# Patient Record
Sex: Female | Born: 1935
Health system: Southern US, Community
[De-identification: ages and names within clinical notes are randomized; demographics above are authoritative.]

## PROBLEM LIST (undated history)

## (undated) DIAGNOSIS — R002 Palpitations: Secondary | ICD-10-CM

## (undated) DIAGNOSIS — M810 Age-related osteoporosis without current pathological fracture: Secondary | ICD-10-CM

## (undated) DIAGNOSIS — M1990 Unspecified osteoarthritis, unspecified site: Secondary | ICD-10-CM

## (undated) DIAGNOSIS — I493 Ventricular premature depolarization: Secondary | ICD-10-CM

## (undated) HISTORY — DX: Age-related osteoporosis without current pathological fracture: M81.0

## (undated) HISTORY — PX: FINGER GANGLION CYST EXCISION: SHX1636

## (undated) HISTORY — DX: Unspecified osteoarthritis, unspecified site: M19.90

## (undated) HISTORY — PX: BREAST CYST ASPIRATION: SHX578

## (undated) HISTORY — PX: CHOLECYSTECTOMY: SHX55

## (undated) HISTORY — DX: Ventricular premature depolarization: I49.3

## (undated) HISTORY — PX: EYE SURGERY: SHX253

## (undated) HISTORY — DX: Palpitations: R00.2

---

## 1999-01-30 ENCOUNTER — Other Ambulatory Visit: Admission: RE | Admit: 1999-01-30 | Discharge: 1999-01-30 | Payer: Self-pay | Admitting: Family Medicine

## 1999-12-15 ENCOUNTER — Encounter: Admission: RE | Admit: 1999-12-15 | Discharge: 1999-12-15 | Payer: Self-pay | Admitting: Family Medicine

## 1999-12-15 ENCOUNTER — Encounter: Payer: Self-pay | Admitting: Family Medicine

## 2000-02-01 ENCOUNTER — Other Ambulatory Visit: Admission: RE | Admit: 2000-02-01 | Discharge: 2000-02-01 | Payer: Self-pay | Admitting: Family Medicine

## 2000-02-19 ENCOUNTER — Encounter: Payer: Self-pay | Admitting: Family Medicine

## 2000-02-19 ENCOUNTER — Encounter: Admission: RE | Admit: 2000-02-19 | Discharge: 2000-02-19 | Payer: Self-pay | Admitting: Family Medicine

## 2001-01-03 ENCOUNTER — Encounter: Admission: RE | Admit: 2001-01-03 | Discharge: 2001-01-03 | Payer: Self-pay | Admitting: Family Medicine

## 2001-01-03 ENCOUNTER — Encounter: Payer: Self-pay | Admitting: Family Medicine

## 2001-02-27 ENCOUNTER — Encounter: Admission: RE | Admit: 2001-02-27 | Discharge: 2001-02-27 | Payer: Self-pay | Admitting: Family Medicine

## 2001-02-27 ENCOUNTER — Encounter: Payer: Self-pay | Admitting: Family Medicine

## 2001-03-03 ENCOUNTER — Other Ambulatory Visit: Admission: RE | Admit: 2001-03-03 | Discharge: 2001-03-03 | Payer: Self-pay | Admitting: Family Medicine

## 2001-07-04 ENCOUNTER — Ambulatory Visit (HOSPITAL_COMMUNITY): Admission: RE | Admit: 2001-07-04 | Discharge: 2001-07-04 | Payer: Self-pay | Admitting: Gastroenterology

## 2001-07-04 ENCOUNTER — Encounter (INDEPENDENT_AMBULATORY_CARE_PROVIDER_SITE_OTHER): Payer: Self-pay | Admitting: *Deleted

## 2002-02-01 ENCOUNTER — Encounter: Payer: Self-pay | Admitting: Family Medicine

## 2002-02-01 ENCOUNTER — Encounter: Admission: RE | Admit: 2002-02-01 | Discharge: 2002-02-01 | Payer: Self-pay | Admitting: Family Medicine

## 2003-01-22 ENCOUNTER — Emergency Department (HOSPITAL_COMMUNITY): Admission: EM | Admit: 2003-01-22 | Discharge: 2003-01-22 | Payer: Self-pay | Admitting: Emergency Medicine

## 2003-03-20 ENCOUNTER — Encounter: Admission: RE | Admit: 2003-03-20 | Discharge: 2003-03-20 | Payer: Self-pay | Admitting: Family Medicine

## 2003-03-20 ENCOUNTER — Encounter: Payer: Self-pay | Admitting: Family Medicine

## 2004-03-27 ENCOUNTER — Encounter: Admission: RE | Admit: 2004-03-27 | Discharge: 2004-03-27 | Payer: Self-pay | Admitting: Family Medicine

## 2005-05-11 ENCOUNTER — Encounter: Admission: RE | Admit: 2005-05-11 | Discharge: 2005-05-11 | Payer: Self-pay | Admitting: Family Medicine

## 2005-06-02 ENCOUNTER — Encounter: Admission: RE | Admit: 2005-06-02 | Discharge: 2005-06-02 | Payer: Self-pay | Admitting: Family Medicine

## 2006-06-16 ENCOUNTER — Encounter: Admission: RE | Admit: 2006-06-16 | Discharge: 2006-06-16 | Payer: Self-pay | Admitting: Family Medicine

## 2006-06-16 IMAGING — MG MM SCREEN MAMMOGRAM BILATERAL
4 series · 4 of 4 positions shown · non-contrast
Comparison: none

DG SCREEN MAMMOGRAM BILATERAL
Bilateral CC and MLO view(s) were taken.
Prior study comparison: [DATE], bilateral screening mammogram.

DIGITAL SCREENING MAMMOGRAM WITH CAD:
There are scattered fibroglandular densities.  There is no dominant mass, architectural distortion 
or calcification to suggest malignancy.

[R CC]
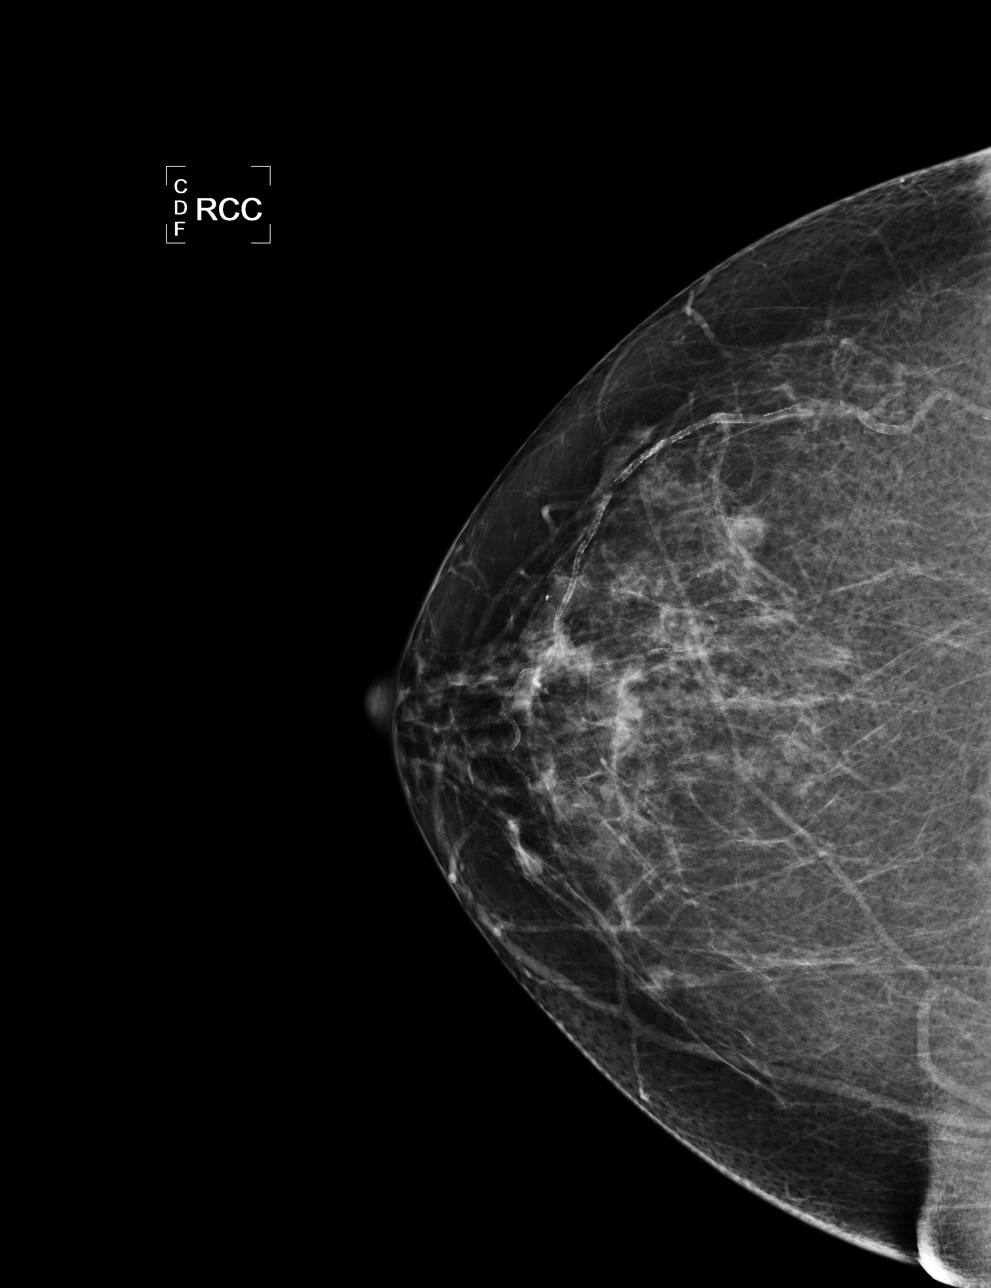

[L CC]
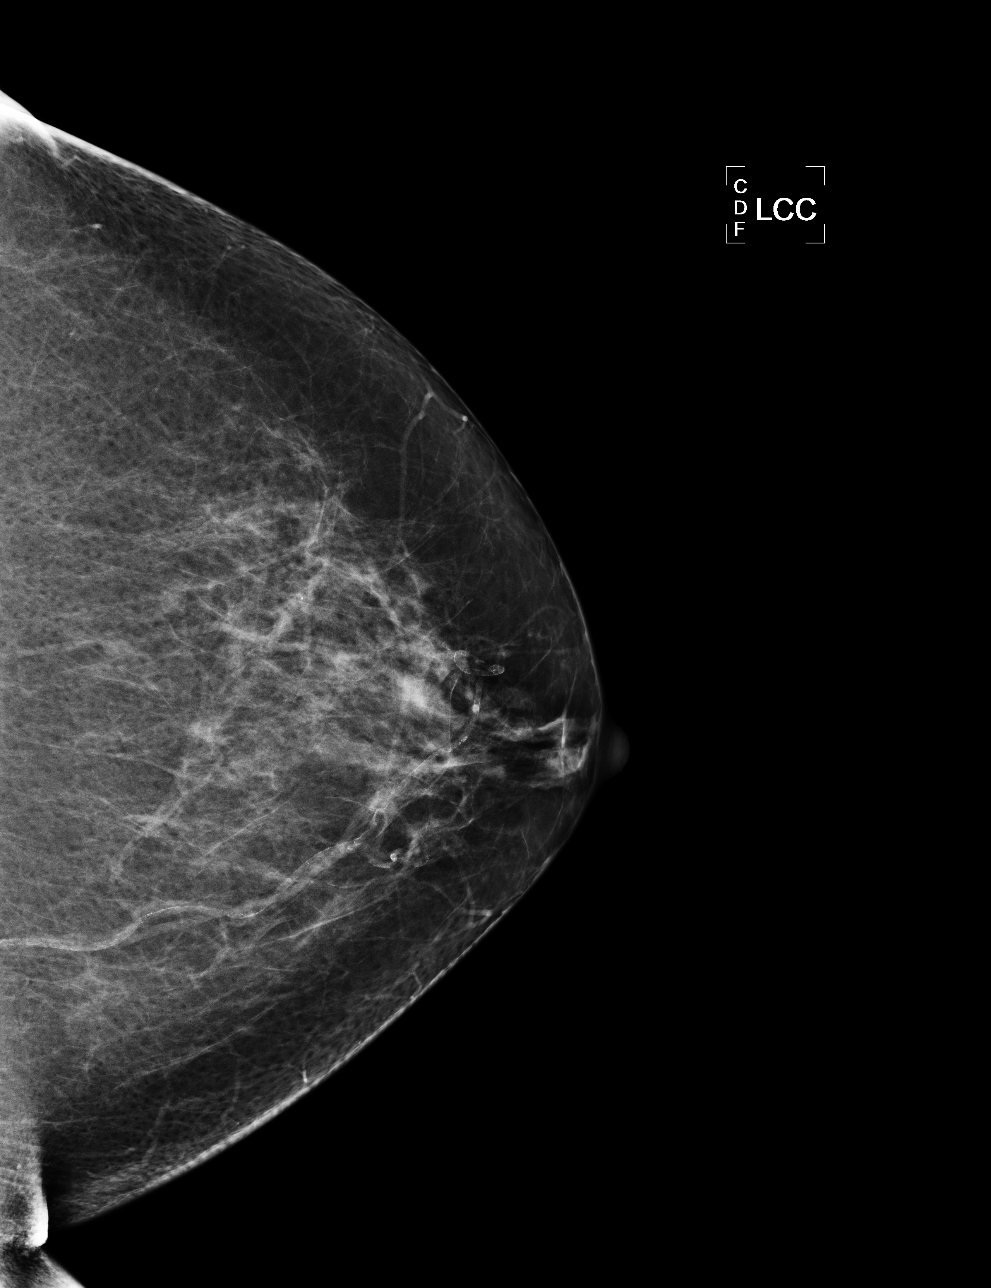

[L MLO]
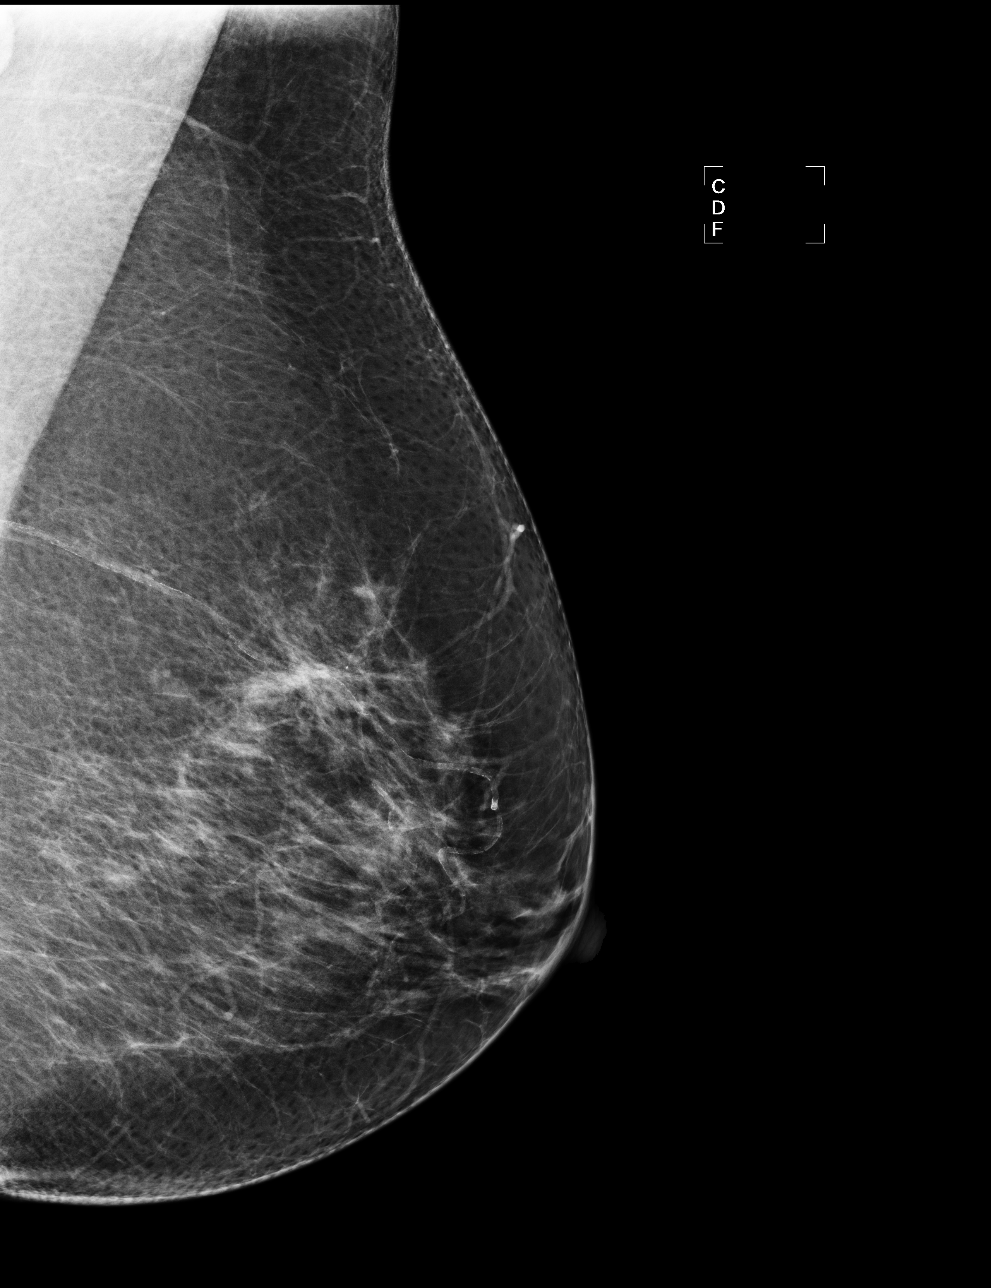

[R MLO]
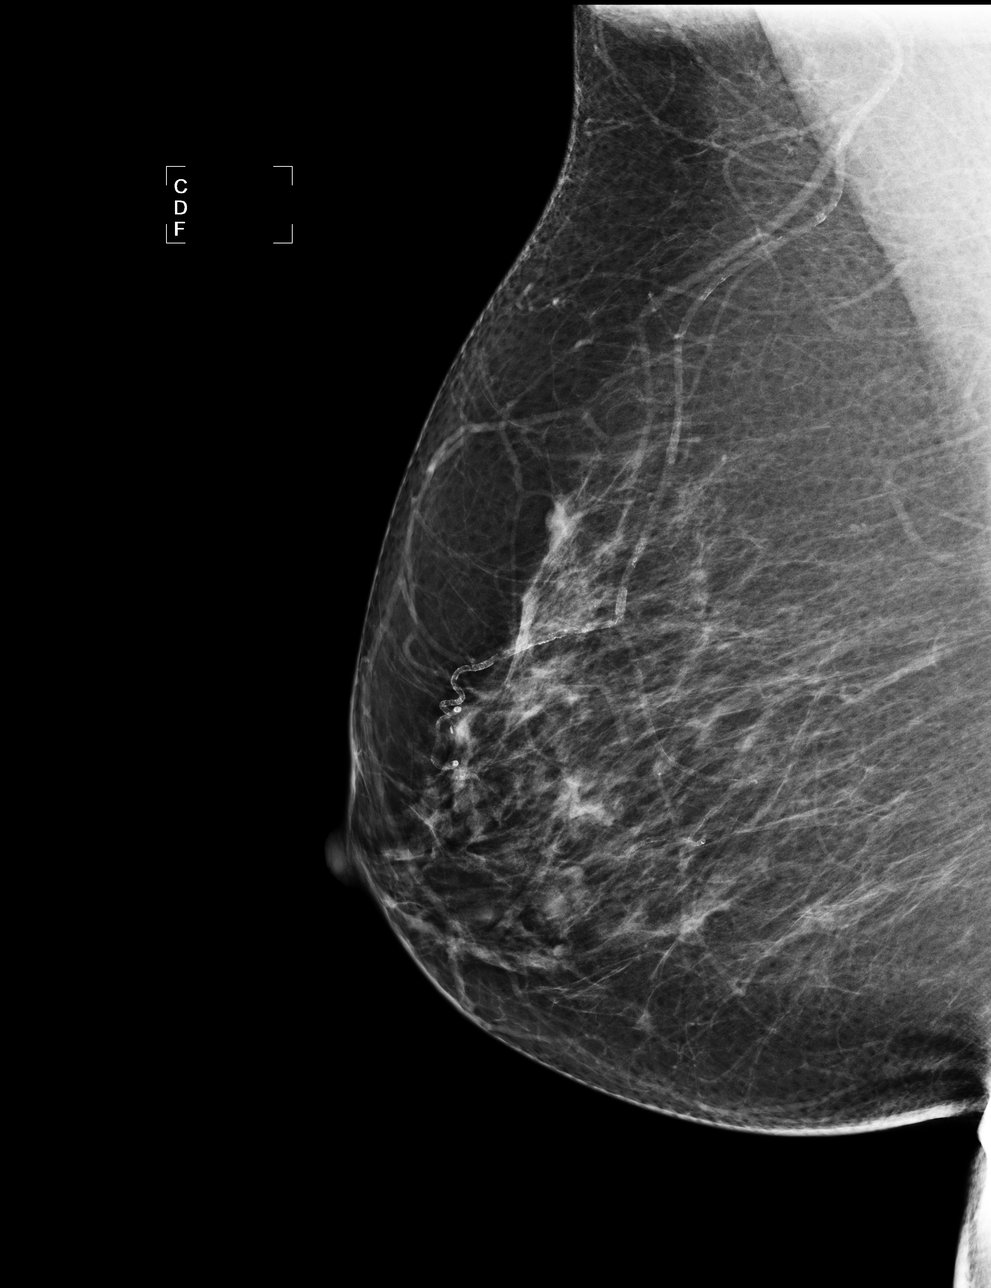

[4 of 4 positions shown; findings below may reference images not displayed]

IMPRESSION: No mammographic evidence of malignancy.  Suggest yearly screening mammography.

ASSESSMENT: Negative - BI-RADS 1

Screening mammogram in 1 year.
ANALYZED BY COMPUTER AIDED DETECTION. , THIS PROCEDURE WAS A DIGITAL MAMMOGRAM.

## 2006-07-07 ENCOUNTER — Inpatient Hospital Stay (HOSPITAL_COMMUNITY): Admission: EM | Admit: 2006-07-07 | Discharge: 2006-07-08 | Payer: Self-pay | Admitting: Emergency Medicine

## 2006-07-07 ENCOUNTER — Ambulatory Visit: Payer: Self-pay | Admitting: Cardiology

## 2006-07-07 IMAGING — CR DG CHEST 1V PORT
1 series · 1 of 1 positions shown · non-contrast
Comparison: None.

CLINICAL DATA: Chest pain.
 PORTABLE CHEST ? 1 VIEW:

[view not recorded]
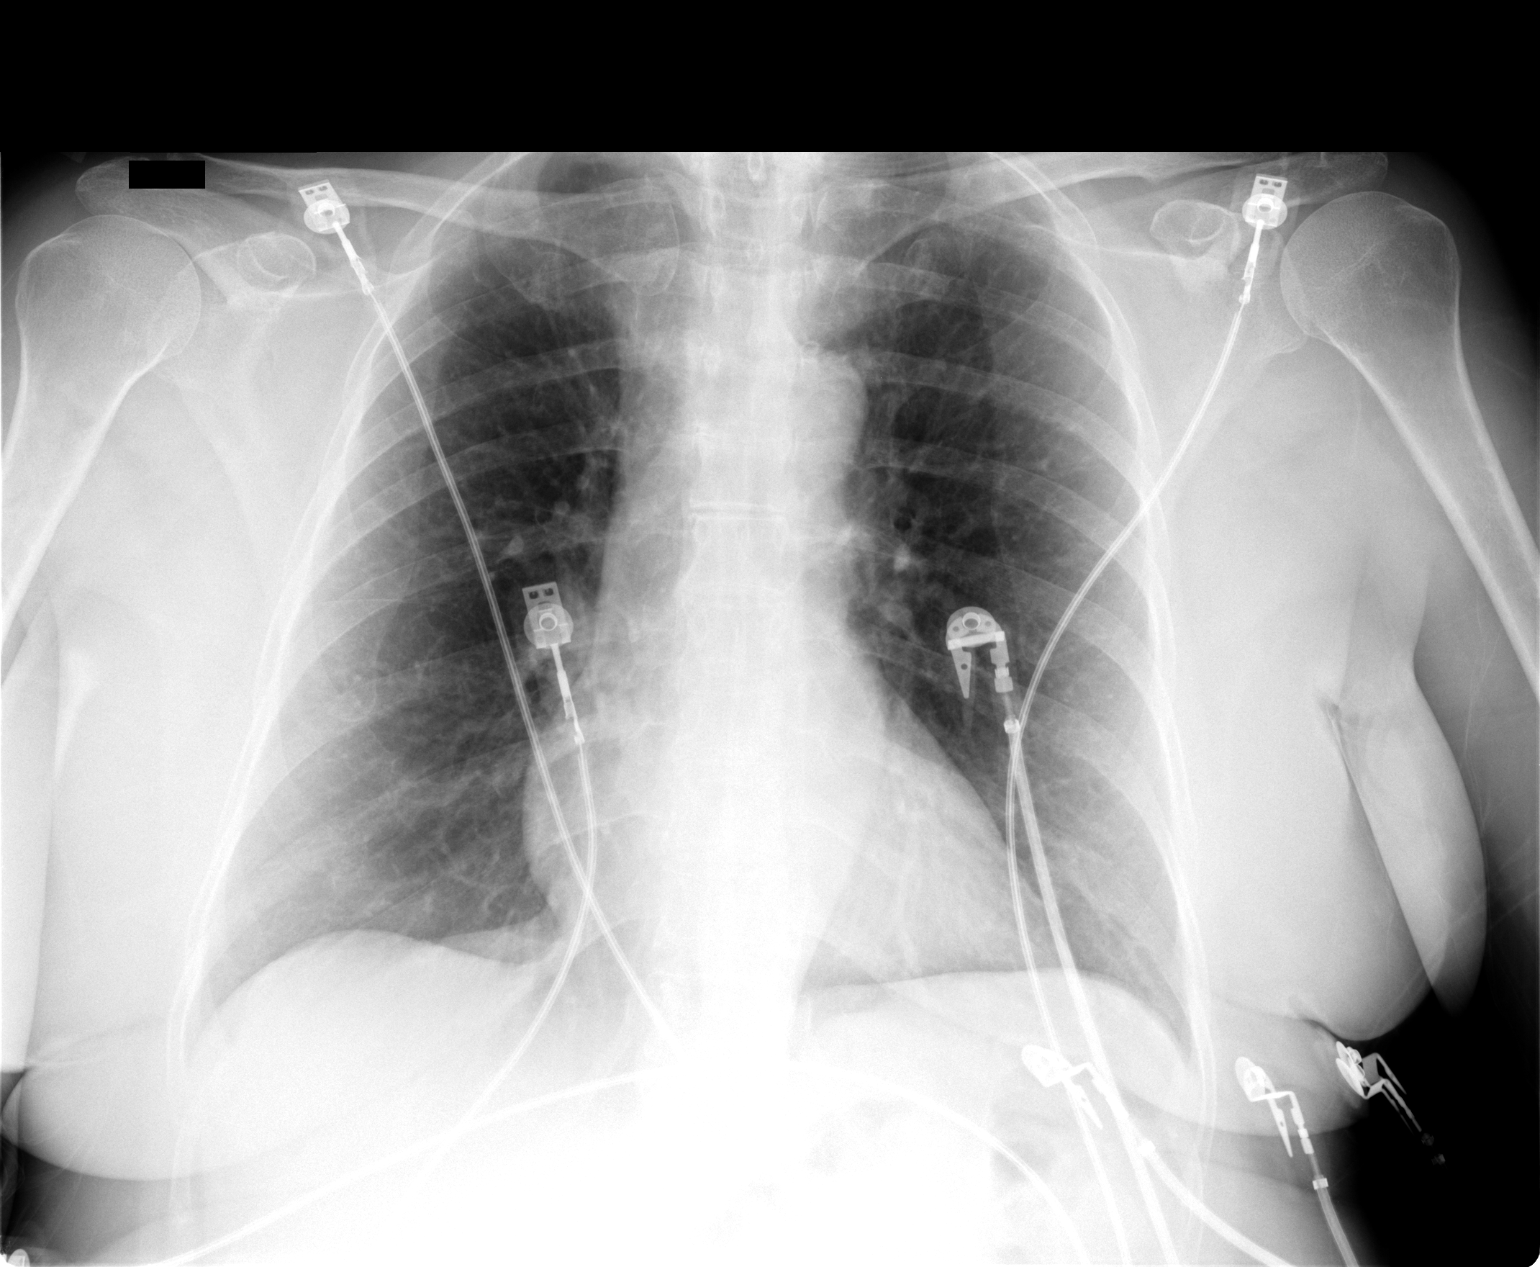

[1 of 1 positions shown; findings below may reference images not displayed]

FINDINGS: Midline trachea.   Heart size is normal.  Minimally tortuous descending thoracic aorta.  Mediastinal contours are otherwise within normal limits.  Costophrenic angles are sharp.  No pneumothorax or congestive failure.  Mild biapical pleural thickening.  COPD.  Lungs are clear.
IMPRESSION: COPD with no acute cardiopulmonary disease.

## 2006-07-08 ENCOUNTER — Encounter: Payer: Self-pay | Admitting: Cardiology

## 2006-07-18 ENCOUNTER — Ambulatory Visit: Payer: Self-pay

## 2007-02-10 DIAGNOSIS — K219 Gastro-esophageal reflux disease without esophagitis: Secondary | ICD-10-CM | POA: Insufficient documentation

## 2007-02-10 DIAGNOSIS — M1990 Unspecified osteoarthritis, unspecified site: Secondary | ICD-10-CM | POA: Insufficient documentation

## 2007-02-10 DIAGNOSIS — M810 Age-related osteoporosis without current pathological fracture: Secondary | ICD-10-CM | POA: Insufficient documentation

## 2007-06-19 ENCOUNTER — Encounter: Admission: RE | Admit: 2007-06-19 | Discharge: 2007-06-19 | Payer: Self-pay | Admitting: Family Medicine

## 2007-06-19 IMAGING — MG MM SCREEN MAMMOGRAM BILATERAL
6 series · 6 of 6 positions shown · non-contrast
Comparison: none

DG SCREEN MAMMOGRAM BILATERAL
Bilateral CC and MLO view(s) were taken.

DIGITAL SCREENING MAMMOGRAM WITH CAD:
There are scattered fibroglandular densities.  No masses or malignant type calcifications are 
identified.  Compared with prior studies.

[R CC]
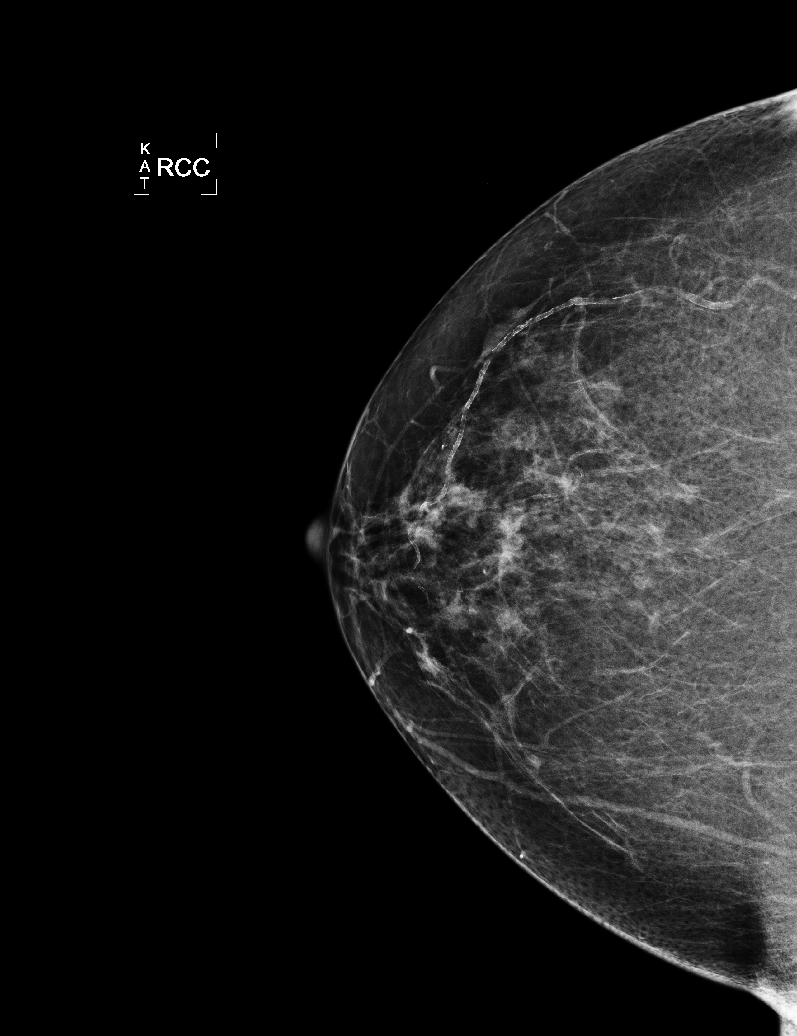

[L CC]
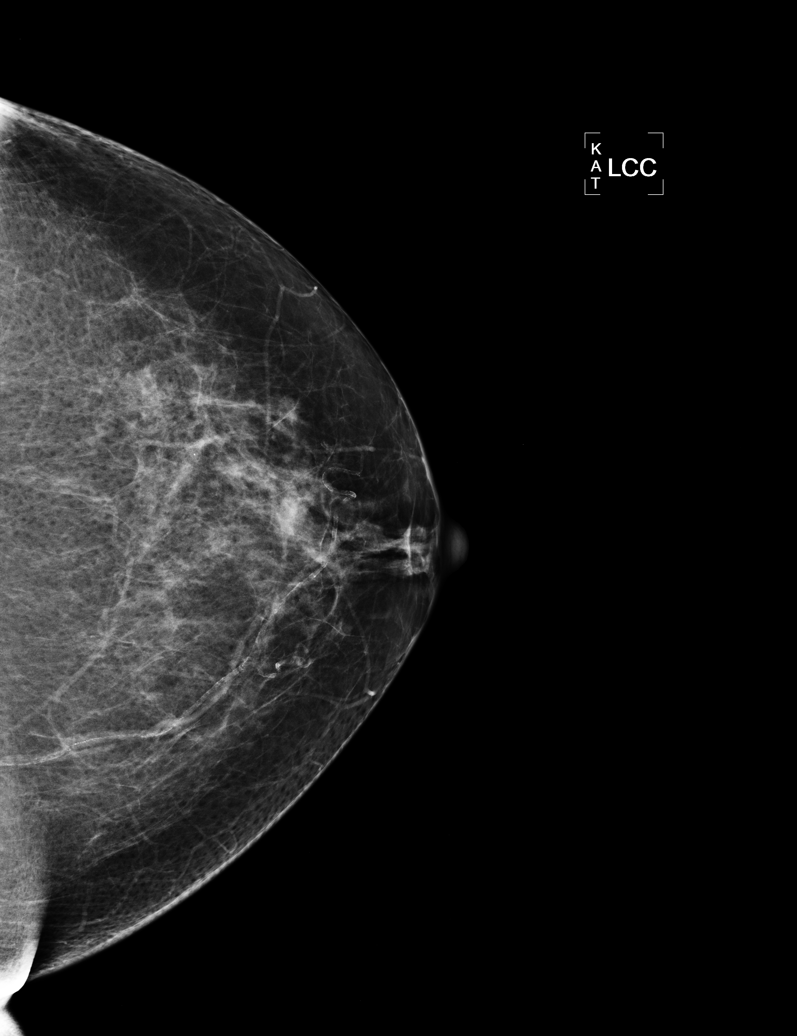

[L MLO (1 of 2)]
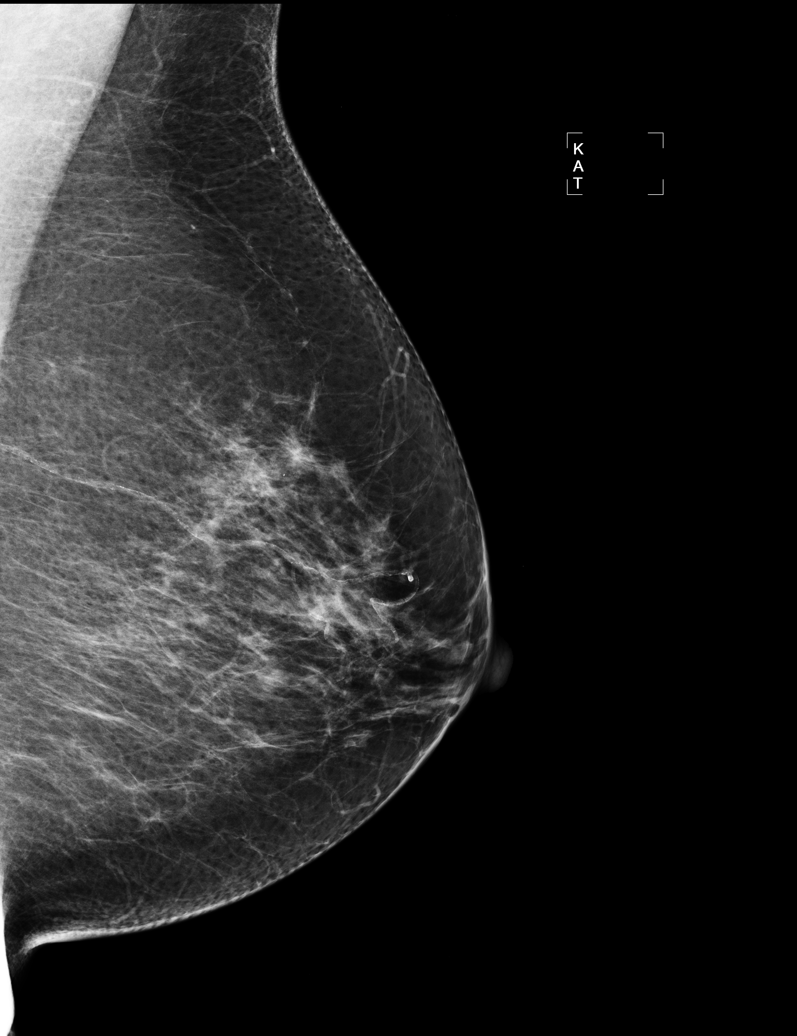

[R MLO (1 of 2)]
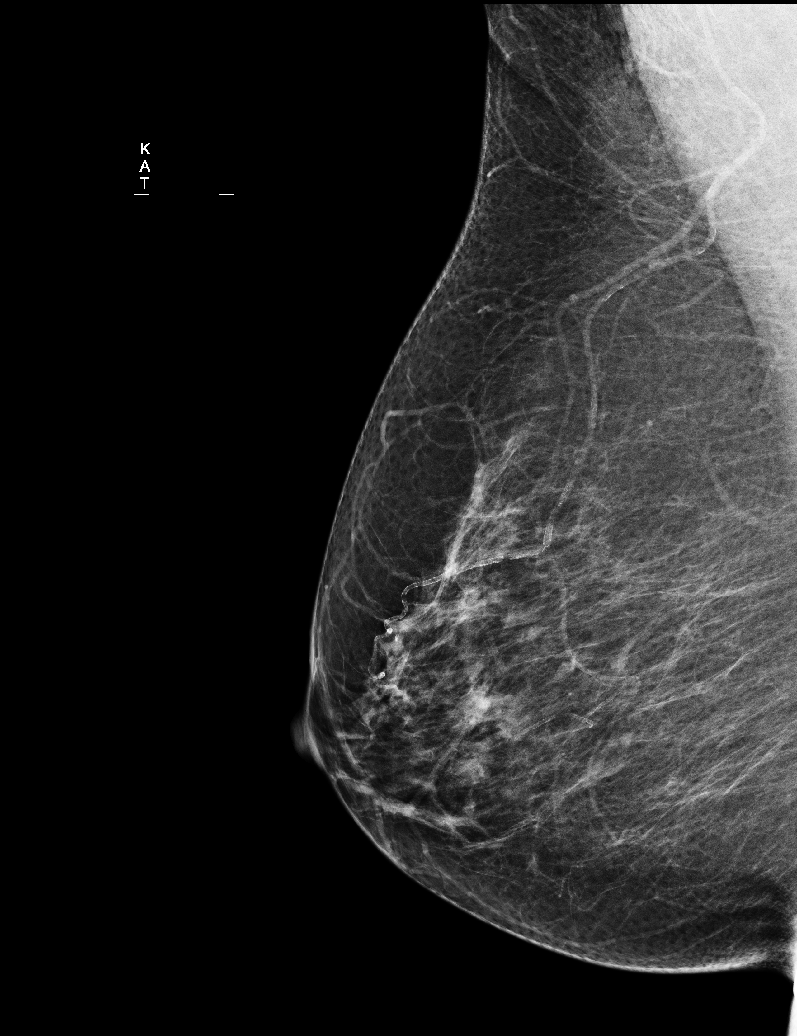

[R MLO (2 of 2)]
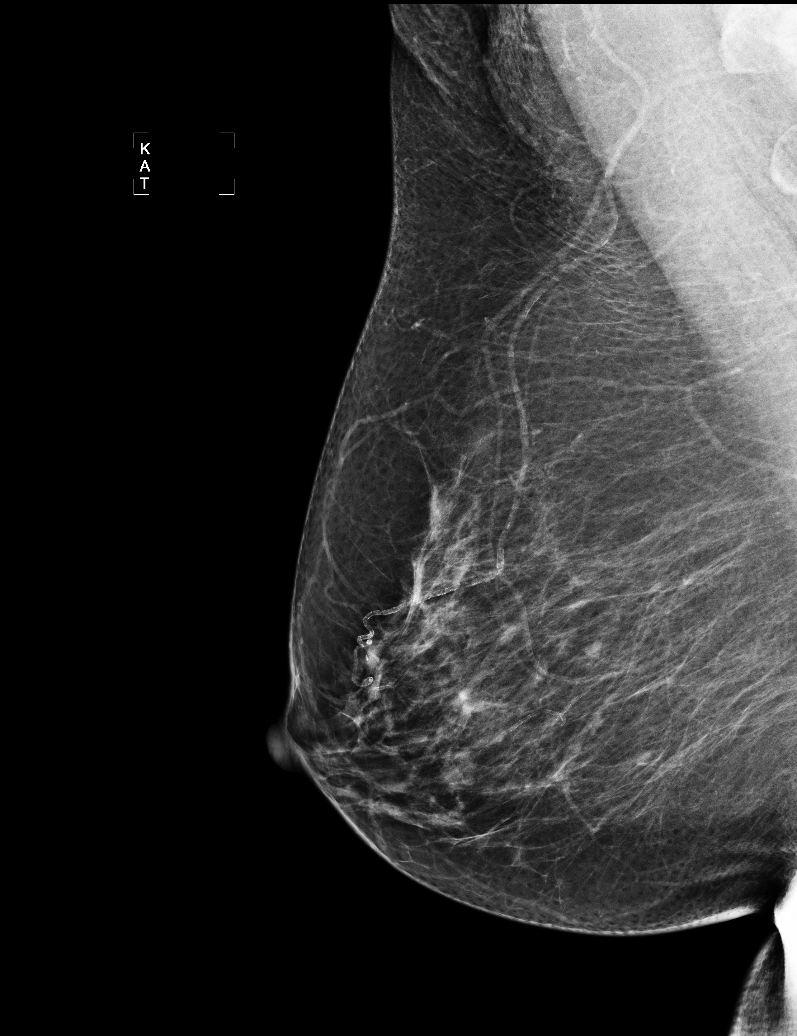

[L MLO (2 of 2)]
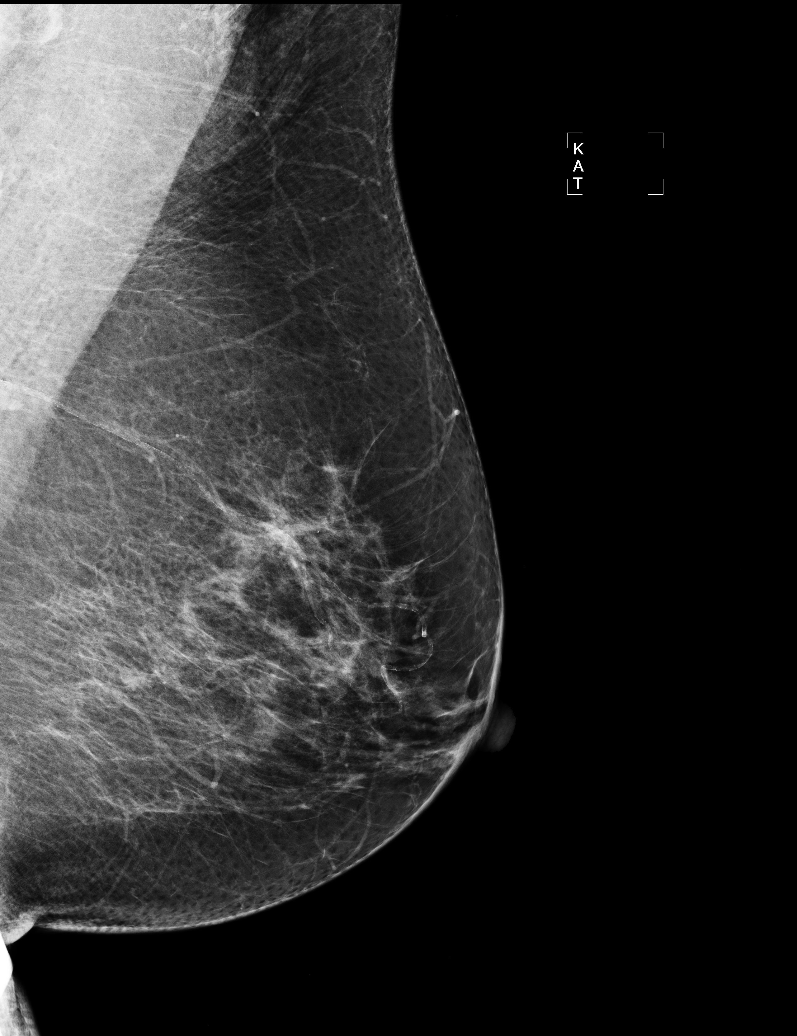

[6 of 6 positions shown; findings below may reference images not displayed]

IMPRESSION: No specific mammographic evidence of malignancy.  Next screening mammogram is recommended in one 
year.

ASSESSMENT: Negative - BI-RADS 1

Screening mammogram in 1 year.
ANALYZED BY COMPUTER AIDED DETECTION. , THIS PROCEDURE WAS A DIGITAL MAMMOGRAM.

## 2007-07-31 ENCOUNTER — Encounter: Admission: RE | Admit: 2007-07-31 | Discharge: 2007-07-31 | Payer: Self-pay | Admitting: Family Medicine

## 2007-08-17 ENCOUNTER — Encounter (INDEPENDENT_AMBULATORY_CARE_PROVIDER_SITE_OTHER): Payer: Self-pay | Admitting: Orthopedic Surgery

## 2007-08-17 ENCOUNTER — Ambulatory Visit (HOSPITAL_BASED_OUTPATIENT_CLINIC_OR_DEPARTMENT_OTHER): Admission: RE | Admit: 2007-08-17 | Discharge: 2007-08-17 | Payer: Self-pay | Admitting: Orthopedic Surgery

## 2008-07-19 ENCOUNTER — Encounter: Admission: RE | Admit: 2008-07-19 | Discharge: 2008-07-19 | Payer: Self-pay | Admitting: Family Medicine

## 2008-07-19 IMAGING — MG MM SCREEN MAMMOGRAM BILATERAL
4 series · 4 of 4 positions shown · non-contrast
Comparison: none

DG SCREEN MAMMOGRAM BILATERAL
Bilateral CC and MLO view(s) were taken.

DIGITAL SCREENING MAMMOGRAM WITH CAD:
There are scattered fibroglandular densities.  No masses or malignant type calcifications are 
identified.  Compared with prior studies.

[R CC]
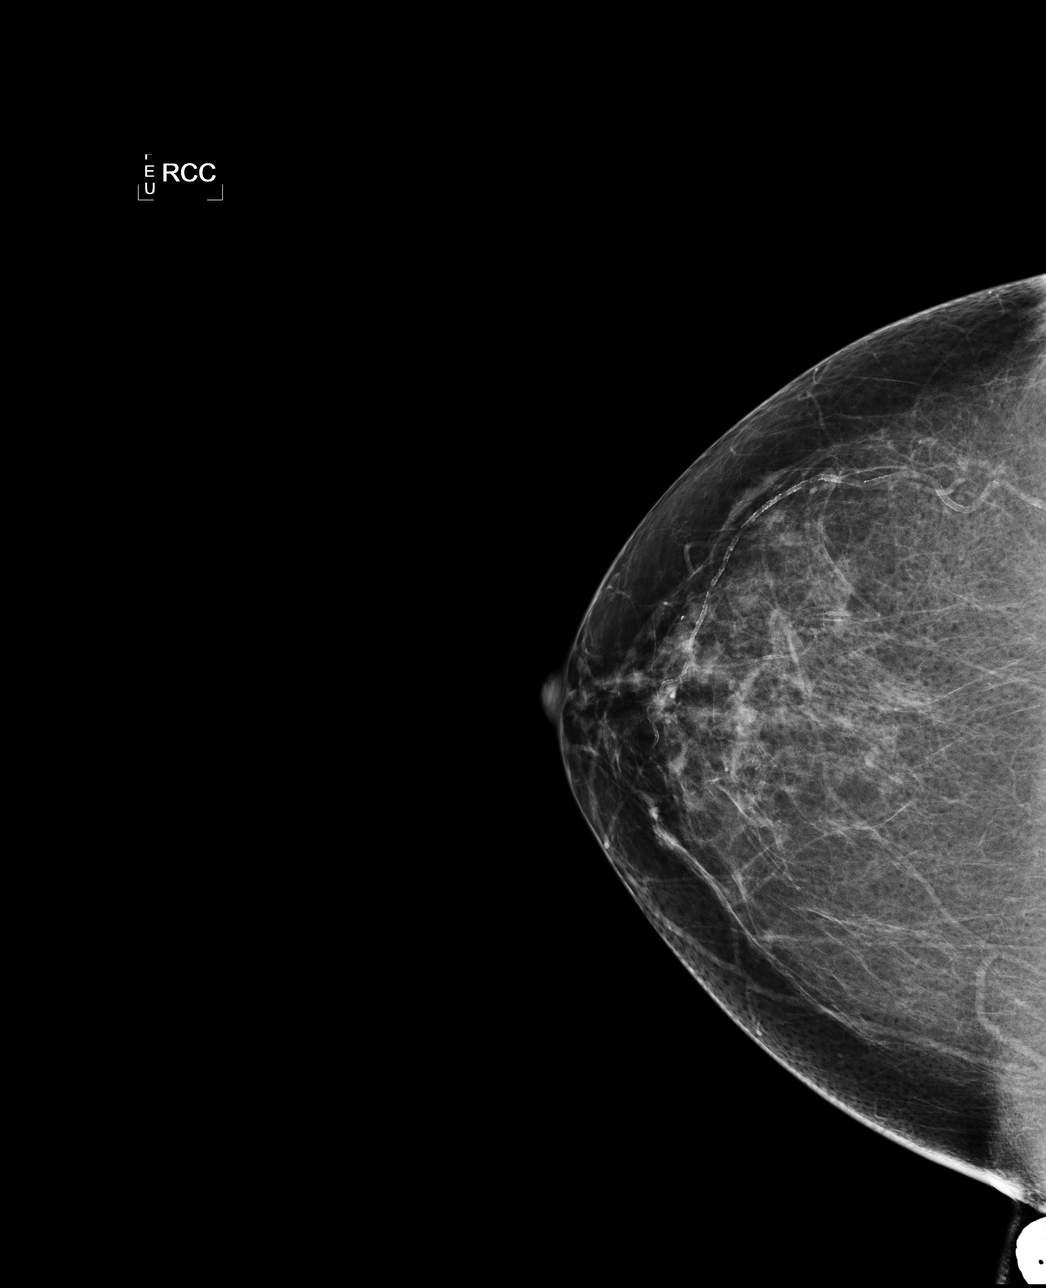

[L CC]
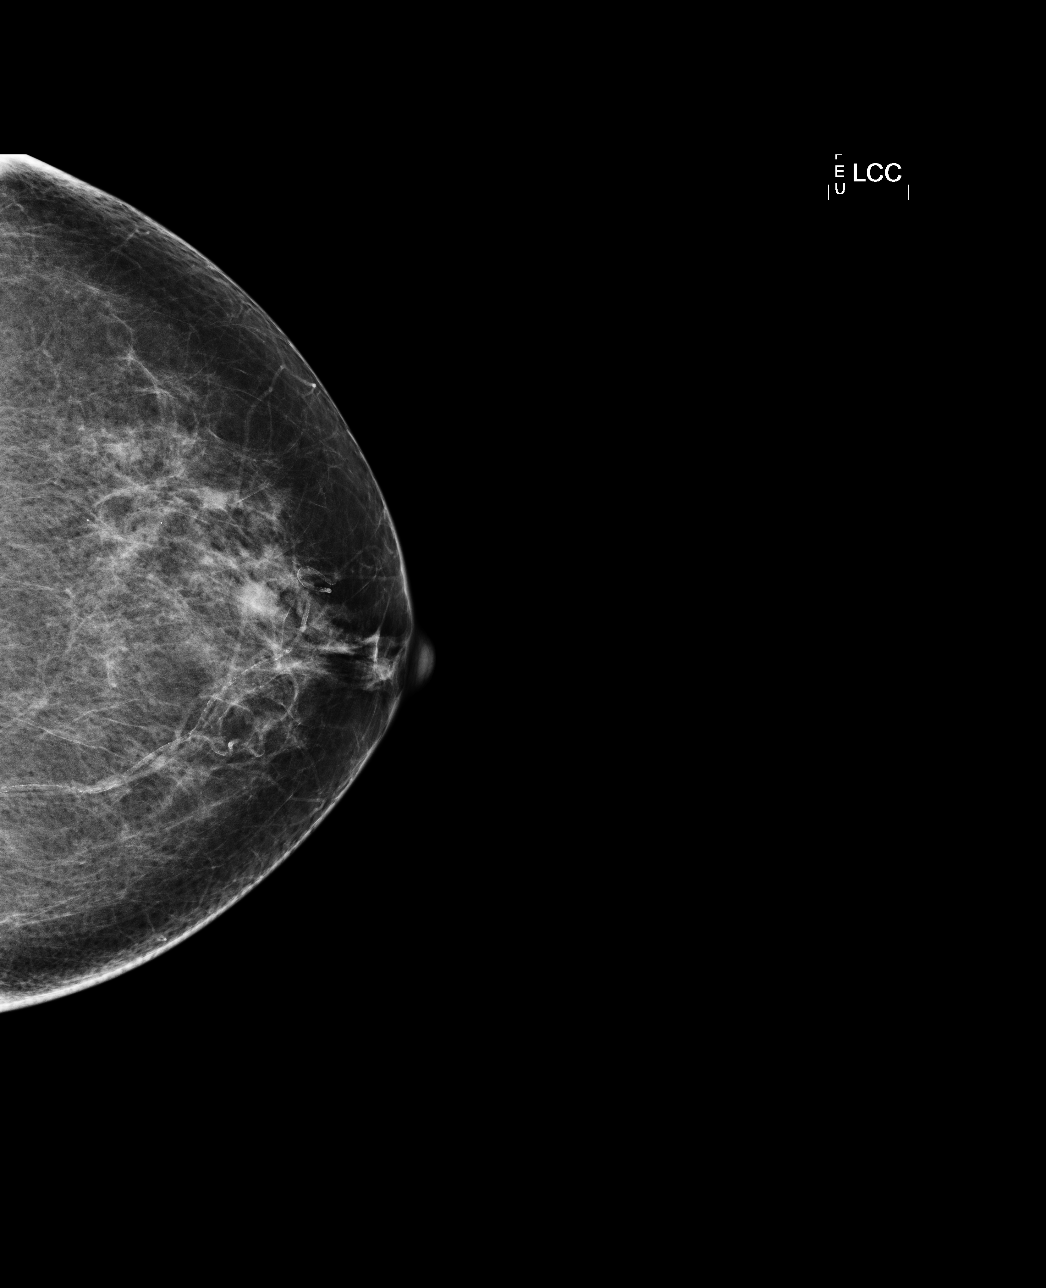

[L MLO]
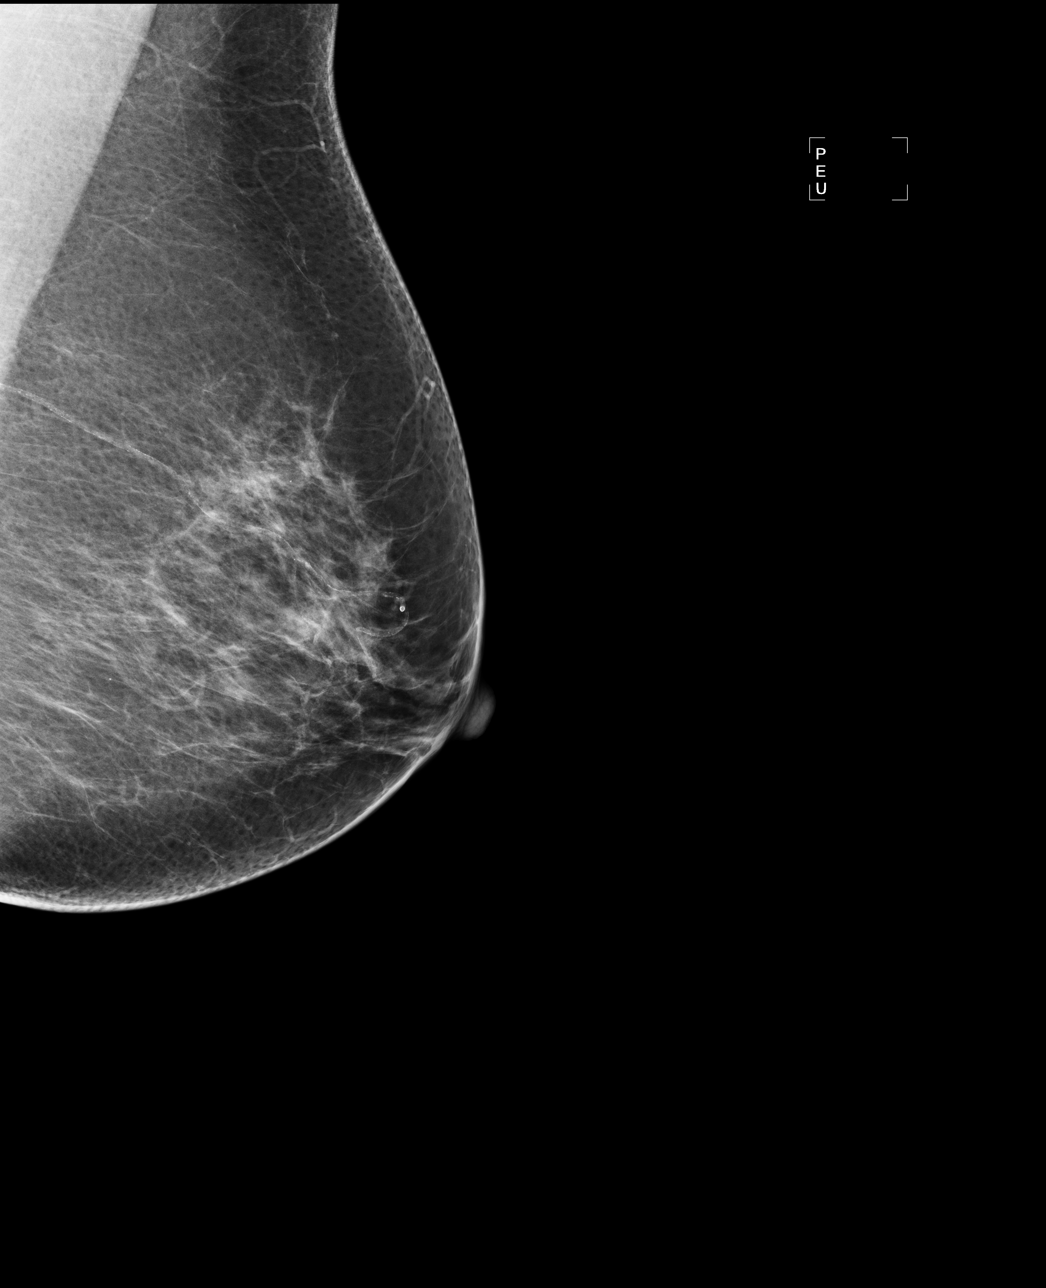

[R MLO]
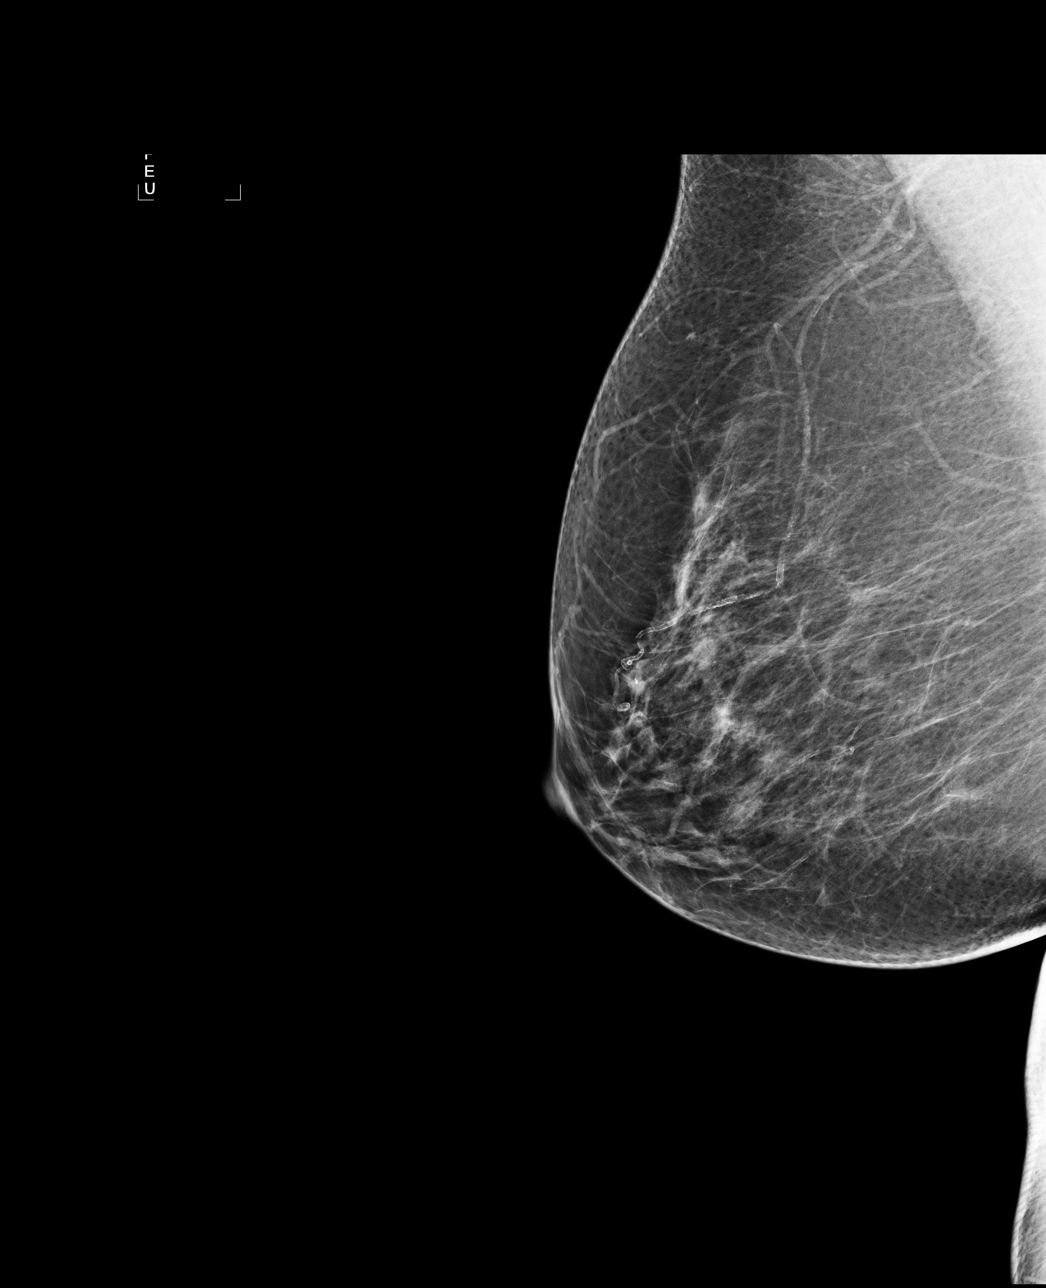

[4 of 4 positions shown; findings below may reference images not displayed]

IMPRESSION: No specific mammographic evidence of malignancy.  Next screening mammogram is recommended in one 
year.

ASSESSMENT: Negative - BI-RADS 1

Screening mammogram in 1 year.
ANALYZED BY COMPUTER AIDED DETECTION. , THIS PROCEDURE WAS A DIGITAL MAMMOGRAM.

## 2009-06-16 ENCOUNTER — Encounter: Admission: RE | Admit: 2009-06-16 | Discharge: 2009-06-16 | Payer: Self-pay | Admitting: Family Medicine

## 2009-07-21 ENCOUNTER — Encounter: Admission: RE | Admit: 2009-07-21 | Discharge: 2009-07-21 | Payer: Self-pay | Admitting: Family Medicine

## 2009-07-21 IMAGING — MG MM DIGITAL SCREENING
5 series · 5 of 5 positions shown · non-contrast
Comparison: Prior studies.

DG SCREEN MAMMOGRAM BILATERAL
Bilateral CC and MLO view(s) were taken.

DIGITAL SCREENING MAMMOGRAM WITH CAD:

[R CC]
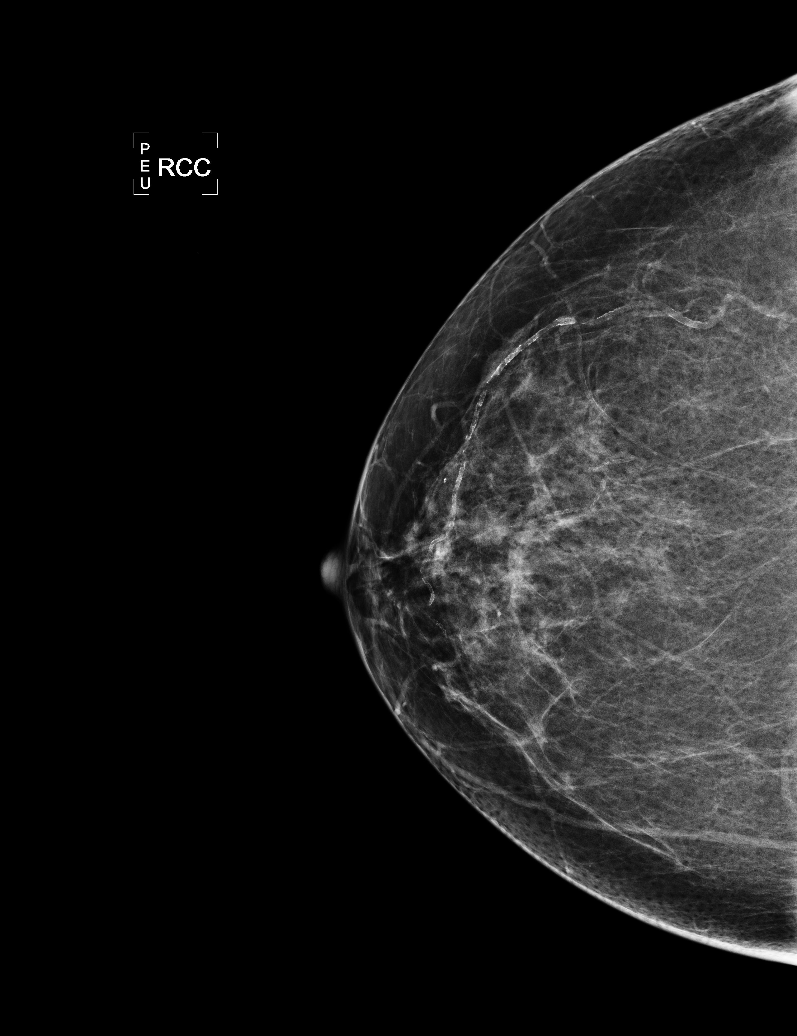

[L CC]
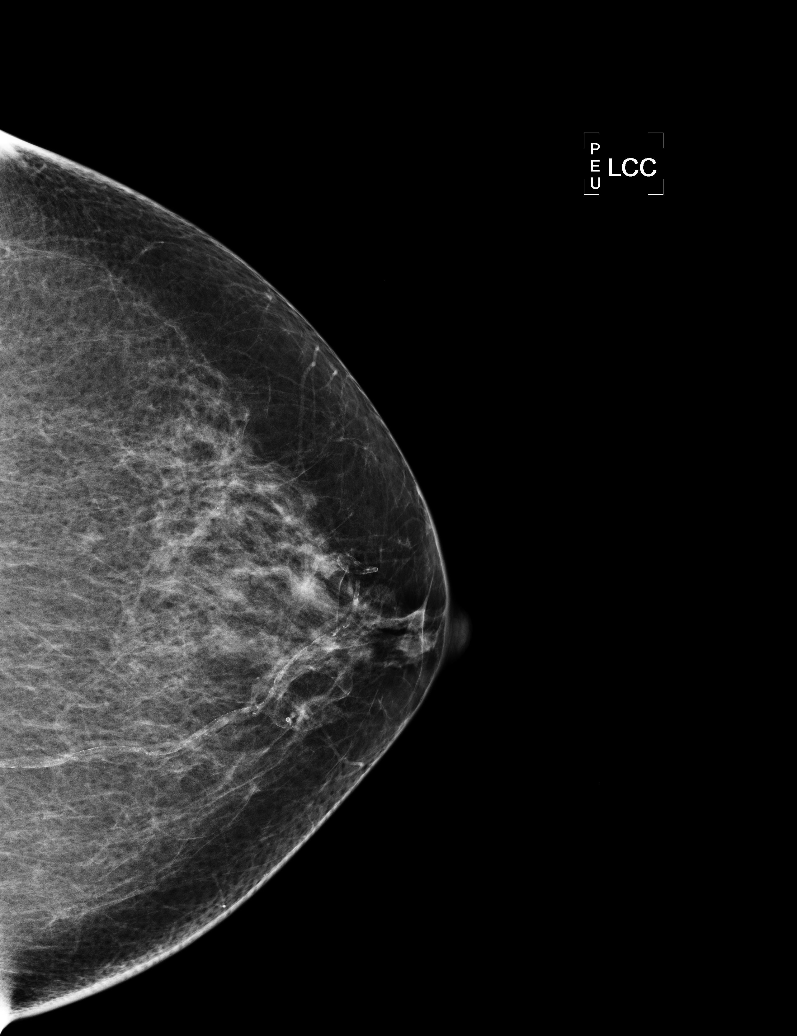

[L MLO (1 of 2)]
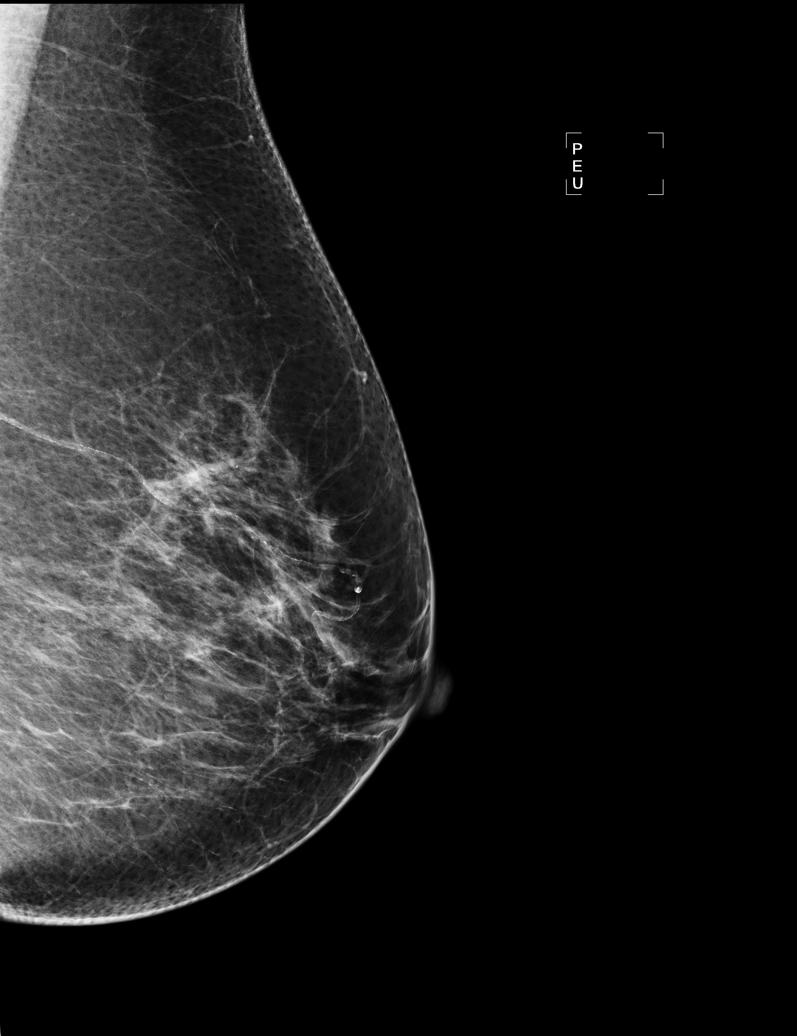

[R MLO]
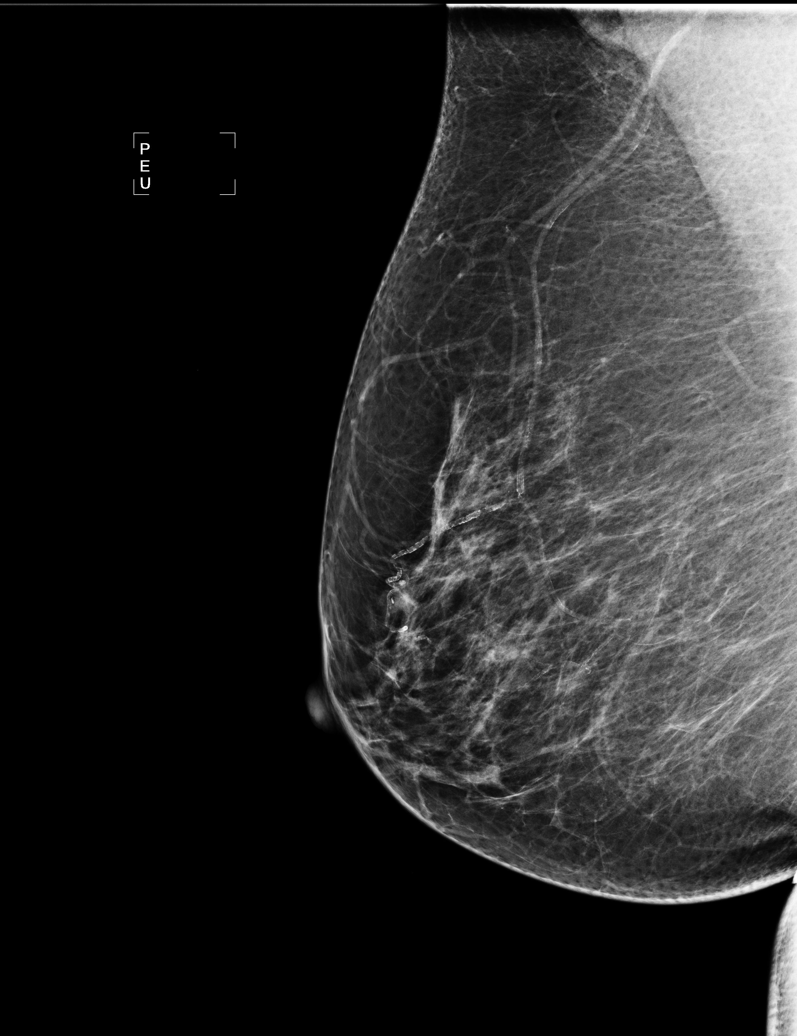

[L MLO (2 of 2)]
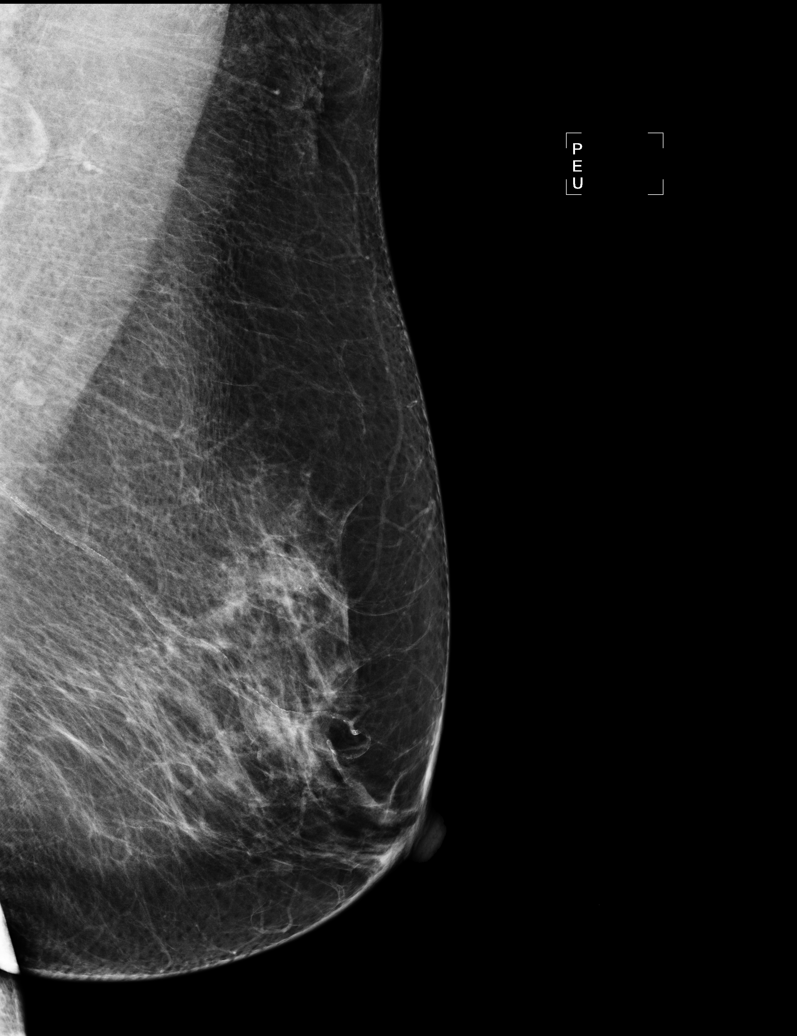

[5 of 5 positions shown; findings below may reference images not displayed]

There are scattered fibroglandular densities.  There is no dominant mass, architectural distortion 
or calcification to suggest malignancy.

Images were processed with CAD.
IMPRESSION: No mammographic evidence of malignancy.  Suggest yearly screening mammography.

A result letter of this screening mammogram will be mailed directly to the patient.

ASSESSMENT: Negative - BI-RADS 1

Screening mammogram in 1 year.
,

## 2010-01-23 ENCOUNTER — Encounter: Admission: RE | Admit: 2010-01-23 | Discharge: 2010-01-23 | Payer: Self-pay | Admitting: Family Medicine

## 2010-07-22 ENCOUNTER — Encounter
Admission: RE | Admit: 2010-07-22 | Discharge: 2010-07-22 | Payer: Self-pay | Source: Home / Self Care | Attending: Family Medicine | Admitting: Family Medicine

## 2010-07-22 IMAGING — MG MM DIGITAL SCREENING
4 series · 4 of 4 positions shown · non-contrast
Comparison: none

DG SCREEN MAMMOGRAM BILATERAL
Bilateral CC and MLO view(s) were taken.

DIGITAL SCREENING MAMMOGRAM WITH CAD:
There are scattered fibroglandular densities.  No masses or malignant type calcifications are 
identified.  Compared with prior studies.
Images were processed with CAD.

[R CC]
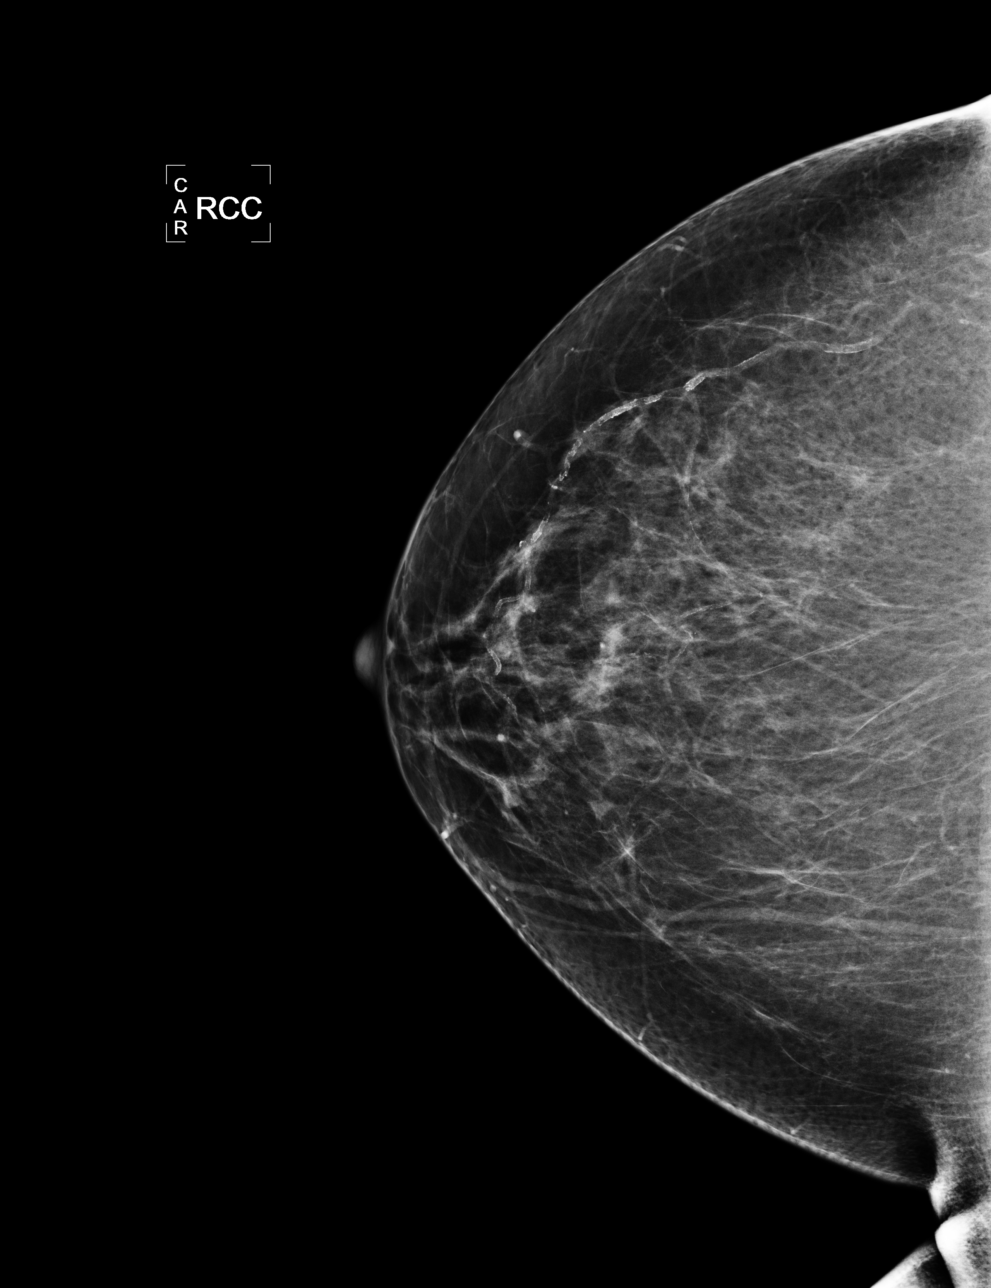

[L CC]
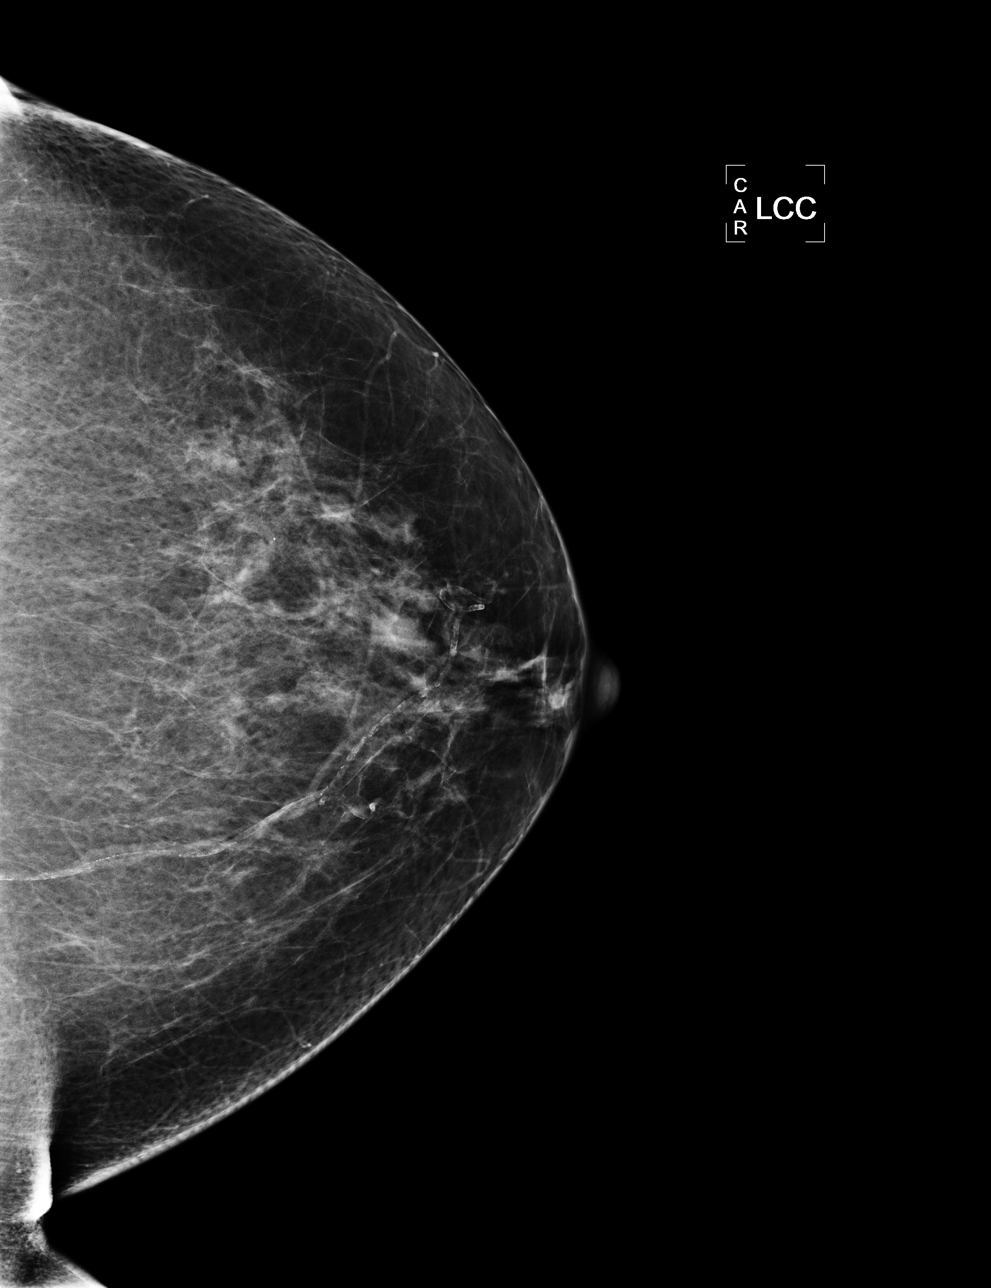

[L MLO]
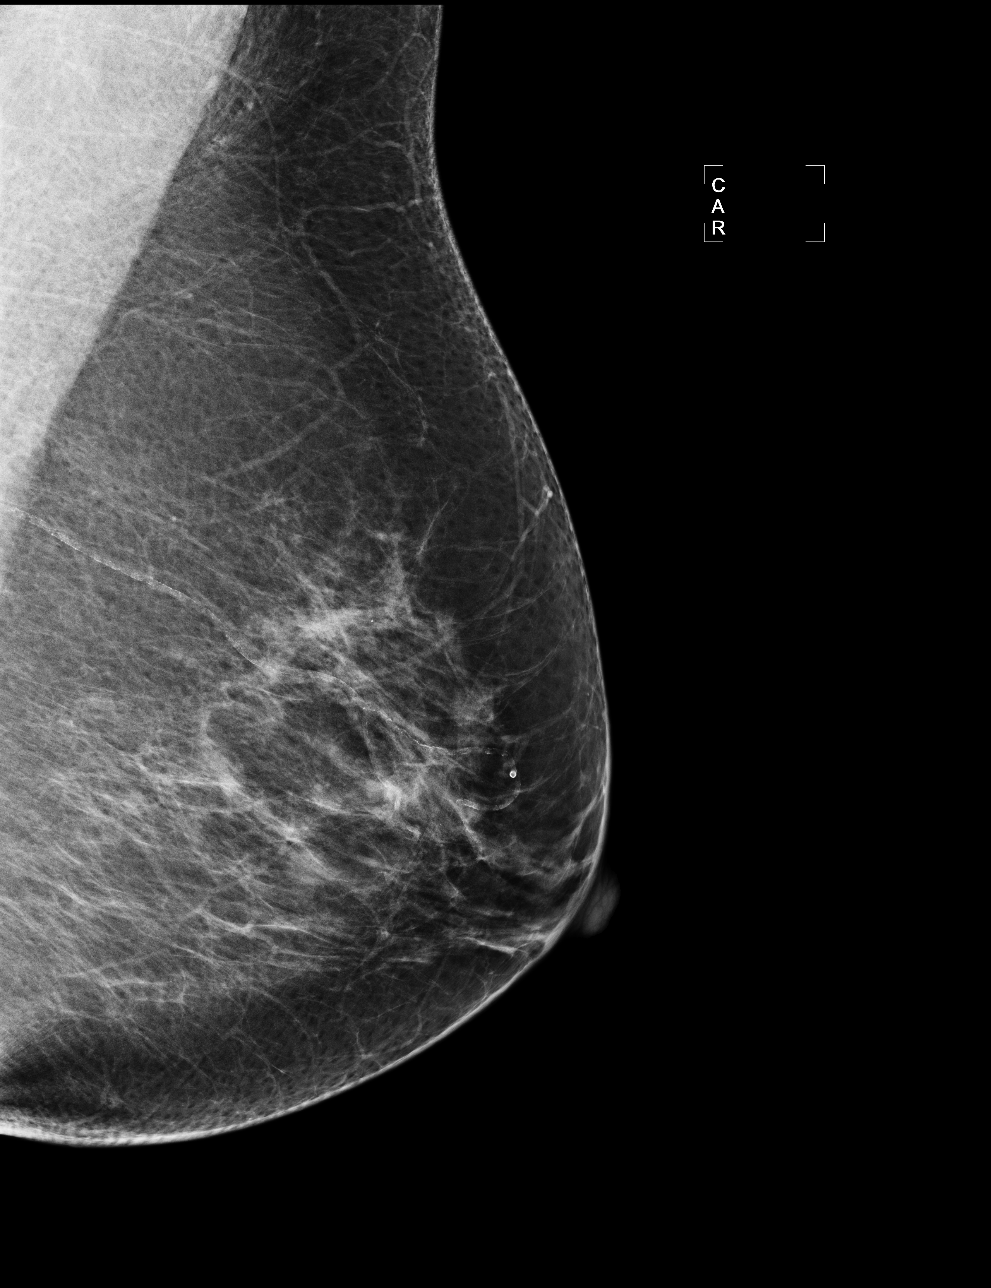

[R MLO]
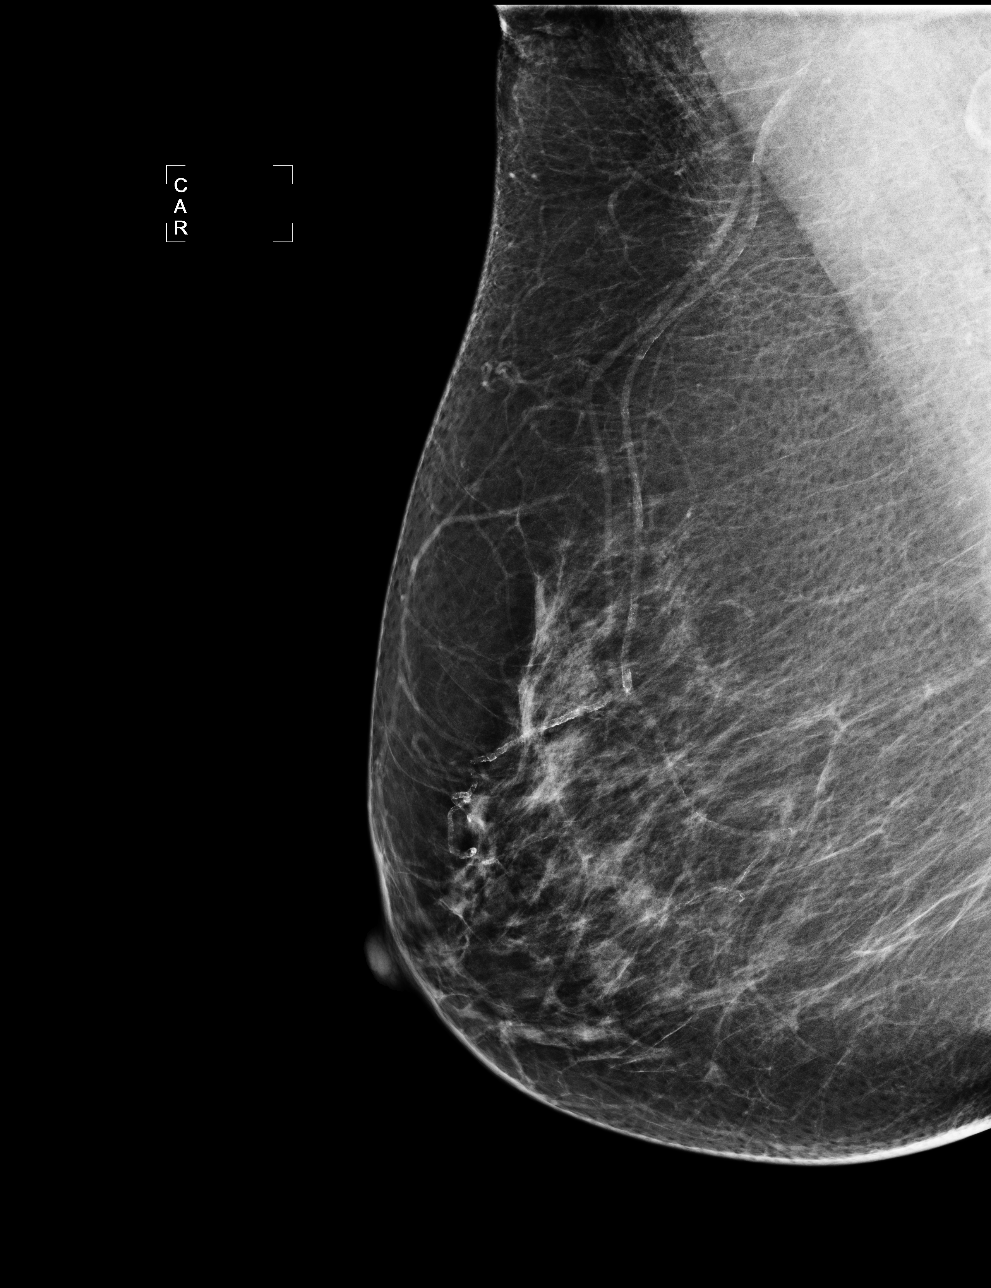

[4 of 4 positions shown; findings below may reference images not displayed]

IMPRESSION: No specific mammographic evidence of malignancy.  Next screening mammogram is recommended in one 
year.

A result letter of this screening mammogram will be mailed directly to the patient.

ASSESSMENT: Negative - BI-RADS 1

Screening mammogram in 1 year.
,

## 2010-11-17 NOTE — Op Note (Signed)
NAME:  Hannah Bates, Hannah Bates           ACCOUNT NO.:  [id number]   MEDICAL RECORD NO.:  [id number]          PATIENT TYPE:  AMB   LOCATION:  DSC                          FACILITY:  [facility]   PHYSICIAN:  Cindee Salt, M.D.       DATE OF BIRTH:  1933-07-15   DATE OF PROCEDURE:  08/17/2007  DATE OF DISCHARGE:  08/17/2007                               OPERATIVE REPORT   PREOPERATIVE DIAGNOSIS:  Mucoid tumor, right thumb.   POSTOPERATIVE DIAGNOSIS:  Mucoid tumor, right thumb.   OPERATION:  Excision, mucoid cyst, debridement of interphalangeal joint,  rotation dorsal flap, right thumb.   SURGEON:  Cindee Salt, MD   ASSISTANT:  Carolyne Fiscal, R.N.   ANESTHESIA:  Forearm-based IV regional.   HISTORY:  The patient is a 77 year old female with a history of a large  mass over the IP joint of her thumb with translucent skin over the mass.  X-rays revealed degenerative changes with osteophyte formation and she  is scheduled for excision of the cyst, debridement the joint and a  rotation flap.  She is aware of risks and complications including  infection, recurrence, injury to arteries, nerves, tendons, incomplete  relief of symptoms, dystrophy, the possibility of loss the skin flap.  Questions have been encouraged and answered.  In the preoperative area  the patient was seen, questions again encouraged and answered, the  extremity marked by both the patient and surgeon, antibiotic given.   PROCEDURE:  The patient was brought to the operating room, where a  forearm-based IV regional anesthetic was carried out without difficulty.  She was prepped using DuraPrep, supine position, with the right arm  free.  A curvilinear incision was made over the IP joint just proximal  to the translucent skin, carried down through subcutaneous tissue.  Bleeders were electrocauterized.  The large cyst was immediately  encountered.  With blunt and sharp dissection, this was dissected free  up to the area were was adherent to  the underlying epidermis.  This was  excised and sent to pathology.  The joint was opened.  A large  osteophyte was removed from the proximal phalanx with a small rongeur.  This was smoothed.  The wound was irrigated.  The translucent skin was  then excised.  The flap was rotated by curving this proximally just  distal to the metacarpophalangeal joint.  The entire dorsum of the thumb  was then rotated distally, fully covering the area of defect.  This was  then sutured into position with  interrupted 5-0 Vicryl Rapide sutures.  A sterile compressive dressing  and splint to the finger applied.  The patient tolerated the procedure  well and was taken to the recovery room for observation in satisfactory  condition.  She will be discharged home to return to the [facility] in 1 week on [place] [facility].           ______________________________  Cindee Salt, M.D.     GK/MEDQ  D:  08/17/2007  T:  08/18/2007  Job:  [id number]   cc:   Maryelizabeth Rowan, MD

## 2010-11-20 NOTE — H&P (Signed)
NAME:  Hannah Bates, Hannah Bates NO.:  [id number]   MEDICAL RECORD NO.:  [id number]          PATIENT TYPE:  INP   LOCATION:  1827                         FACILITY:  [facility]   PHYSICIAN:  Michaelyn Barter, M.D. DATE OF BIRTH:  Apr 27, 1934   DATE OF ADMISSION:  07/07/2006  DATE OF DISCHARGE:                              HISTORY & PHYSICAL   PRIMARY CARE PHYSICIAN:  Dr. Herb Grays.   CHIEF COMPLAINT:  Chest pain.   HISTORY OF PRESENT ILLNESS:  Hannah Bates is a 77 year old female with a  past medical history of high cholesterol who states that at  approximately 11:30 a.m. this morning she was at work and while sitting  at her desk she developed chest pain across the central area of her  chest which radiated up to the right jaw.  The sensation lasted for  approximately 30 minutes and was relieved after EMS were called and she  was given 4 baby aspirin as well as 4 nitroglycerin tablets by the EMS.  The pain was described as a dull ache, it reached 6-8/10 in intensity.  She has never had similar pain before.  There were no aggravating  factors, no shortness of breath, no diaphoresis, no nausea, vomiting, no  headaches or lightheadedness, no shortness of breath with ambulation.  The patient states that she has never had similar pain before.  She has  never undergone a cardiac evaluation either.   PAST MEDICAL HISTORY:  1. High cholesterol.  2. Osteoporosis.  3. Heartburn.  4. Diverticulitis.  5. Vertigo.   PAST SURGICAL HISTORY:  Cholecystectomy in 1985.   HOME MEDICATIONS:  1. Generic Zantac 75 mg two tablets in the morning, two tablets in the      evening p.r.n.  2. Lipitor __________ mg p.o. q.h.s.  3. Fosamax.  The patient had taken this in the past, however she feels      as though this caused some stomach upset therefore she stopped.   ALLERGIES:  1. SULFA CAUSES A RASH.  2. CODEINE CAUSES A RASH.   SOCIAL HISTORY:  Cigarettes never.   FAMILY HISTORY:  Mother  had a history of Alzheimer's dementia and  hypercholesterolemia.  Father died from a heart attack at the age of 84.  The patient has 2 children; one son is alive and healthy, the other son  is deceased.   REVIEW OF SYSTEMS:  No bowel movement changes, no urinary changes, no  abdominal pain, positive chest pain, no neurological complaints.   PHYSICAL EXAMINATION:  GENERAL:  The patient is awake, she is  cooperative.  She does not appear to be in any obvious distress.  VITALS:  Her temperature is 98.6, blood pressure 134/67, heart rate 87,  respirations 20, O2 sat is 98%.  HEENT:  Normocephalic, atraumatic, anicteric.  Pupils are equally  reactive to light.  Oral mucosa is pink, no exudates, no thrush.  Upper  dentures are present.  NECK:  Supple.  No JVD, no lymphadenopathy.  Thyroid is not palpable.  CARDIAC:  Heart sounds are distant.  S1, S2 is present with a regular  rate and rhythm.  No murmurs, gallops or rubs are appreciated.  RESPIRATORY:  Lungs are clear.  No crackles or wheezes.  ABDOMEN:  Soft, nontender, nondistended, positive bowel sounds, no  masses palpated.  EXTREMITIES:  No leg edema.  NEUROLOGIC:  The patient is alert and oriented x3.  MUSCULOSKELETAL:  Upper and lower extremity strength 5/5.   LABS:  ABG:  A pH of 7.347, pCO2 48.1, bicarb 26.4 and hemoglobin 15,  hematocrit 44.  Sodium 140, potassium 3.9, chloride 108, CO2 97, BUN 16,  creatinine 1.1, glucose 97.  CK, MB POC less than 1, troponin I, POC  less than 0.05, myoglobin, POC 115.  Urinalysis is negative.  EKG  reveals normal sinus rhythm, no Q waves or ST segment changes.  Chest x-  ray reveals COPD, no active cardiopulmonary disease.   ASSESSMENT AND PLAN:  Hannah Bates is a 77 year old female here for the  evaluation of acute onset chest pain.  1. The etiology of this is cardiac vs. noncardiac in origin.  Will      cycle the patient's cardiac enzymes including troponin I and CK MB      times three  every 8 hours apart.  Will provide morphine, oxygen,      p.r.n. nitroglycerin as well as aspirin.  In addition, will check a      D-Dimer, a fasting lipid profile as well as a 2D echocardiogram.  2. History of hypercholesterolemia.  Will restart the patient's      Lipitor.  3. Gastrointestinal prophylaxis.  Will provide Protonix.  4. Deep vein thrombosis prophylaxis.  Will provide Lovenox.      Michaelyn Barter, M.D.  Electronically Signed     OR/MEDQ  D:  07/07/2006  T:  07/07/2006  Job:  [id number]   cc:   Tammy R. Collins [place], M.D.

## 2010-11-20 NOTE — Procedures (Signed)
[place]  Patient:    Hannah Bates, Hannah Bates Visit Number: [id number] MRN: [id number]          Service Type: END Location: ENDO Attending Physician:  Rich Brave Dictated by:   Florencia Reasons, M.D. Proc. Date: 07/04/01 Admit Date:  07/04/2001   CC:         Tammy R. Collins [place], M.D.   Procedure Report  PROCEDURE PERFORMED:  Colonoscopy with biopsy.  ENDOSCOPIST:  Florencia Reasons, M.D.  INDICATIONS FOR PROCEDURE:  The patient is a 77 year old female for colon cancer screening.  FINDINGS:  Diminutive rectal polyp.  Sigmoid diverticulosis.  DESCRIPTION OF PROCEDURE:  The nature, purpose and risks of the procedure had been discussed with the patient, who provided written consent.  Sedation was fentanyl 40 mcg and Versed 4 mg IV without arrhythmias or desaturation.  The Olympus adjustable tension pediatric video colonoscope was easily advanced the cecum as identified by clear visualization of the ileocecal valve and the absence of further lumen.  The appendiceal orifice was also identified. Pullback was then performed.  There was a little bit of stool film coating the proximal ascending colon and the cecum but it is not felt this would have obscured any significant lesions and in fact, irrigation was performed to help better visualize this area.  In the distal rectum was a 2 to 3 mm sessile polyp which would not entirely flatten out with insufflation even though it was hyperplastic in appearance, I elected to go ahead and remove it by a single cold biopsy.  No other polyps were observed.  Retroflexion in the rectum prior to the biopsy was unremarkable.  The patient has noted occasional rectal bleeding but I did not see significant internal hemorrhoids or any other significant pathology in the distal rectum or anal canal.  There was mild to moderate sigmoid diverticulosis.  No large polyps, cancer, colitis or vascular  malformations were observed.  The patient tolerated the procedure well and there were no apparent complications.  IMPRESSION:  Diminutive rectal polyp.  Sigmoid diverticulosis.  PLAN:  Await pathology on polyp.  Colonoscopy in five years if it is adenomatous in character, otherwise sigmoidoscopy for screening purposes in five years. Dictated by:   Florencia Reasons, M.D. Attending Physician:  Rich Brave DD:  07/04/01 TD:  07/04/01 Job: [id number]

## 2010-11-20 NOTE — Discharge Summary (Signed)
NAME:  Hannah Bates, Hannah Bates NO.:  [id number]   MEDICAL RECORD NO.:  [id number]          PATIENT TYPE:  INP   LOCATION:  3713                         FACILITY:  [facility]   PHYSICIAN:  Michaelyn Barter, M.D. DATE OF BIRTH:  1934-05-11   DATE OF ADMISSION:  07/07/2006  DATE OF DISCHARGE:  07/08/2006                               DISCHARGE SUMMARY   FINAL DIAGNOSIS:  Chest pain.   PROCEDURES:  1. Portable chest x-ray completed July 07, 2006.  2. A 2D echocardiogram completed July 08, 2006.   HISTORY OF PRESENT ILLNESS:  Mrs. Hannah Bates is a 77 year old female with a  past medical history of hypercholesterolemia, who stated that on the  morning of her admission at approximately 11:30 a.m., while at work, she  was sitting at her desk when she developed chest pain across the central  area of her chest which radiated up her right jaw.  The sensation lasted  for approximately 30 minutes and was relieved after EMS was called, and  they gave her four baby aspirin as well as four nitroglycerin tablets.  The pain was described as a dull ache.  It reached approximately 6-8/10  in intensity.  She was brought to the hospital for further evaluation.  For past medical history, please see that dictated by Dr. Michaelyn Barter on July 07, 2006.   HOSPITAL COURSE:  CHEST PAIN.  The patient had a portable chest x-ray  completed July 07, 2006.  It revealed the heart size to be normal.  No  pneumothorax or congestive heart failure was seen.  Mild biapical  pleural thickening.  COPD.  The lungs are clear.  An EKG was also  completed on January 3.  It revealed normal sinus rhythm, good R wave  progression.  No Q-waves.  No ST segment abnormalities.  The patient had  a troponin I completed which was found to be 0.01.  A D. dimer was also  completed which was 0.44 which was completely normal.  A repeat troponin  I was 0.01, and the final troponin I was less than 0.01.  The patient's  CK  MB's were also noted to have been 1.6, 1.2 and 1.3, respectively.  Therefore, by lab data, the patient ruled out for cardiac event.  Likewise, with the negative D. dimer, she ruled out for a PE.  A 2D  echocardiogram was also completed on July 08, 2006.  The overall left  ventricular systolic function was normal.  Left ventricular EF was  estimated to be 60%.  There was no diagnostic evidence of left  ventricular regional wall motion abnormalities.  Left ventricular wall  thickness was at the upper limits of normal.  By the date following  admission into the hospital, she indicated that her chest pain had  completely resolved.  She also stated that she was ready to be  discharged from the hospital.  The plan was made to discharge the  patient from the hospital.  Her vitals on the day of discharge:  Temperature 97.1, heart rate 88, respirations 20, blood pressure 150/81,  O2 saturation 95% on room air.  I called [facility], and asked them if they could please perform  a stress test on the patient, given the fact that the patient was an  elderly female, has history of hypercholesterolemia, and that the  etiology of her chest pain still remains questionable.    Cardiology agreed to do a stress test on July 18, 2006, at 11:30 a.m.  The patient was told to report to [facility] and given the phone  number [phone].      Michaelyn Barter, M.D.  Electronically Signed     OR/MEDQ  D:  07/15/2006  T:  07/15/2006  Job:  [id number]   cc:   Tammy R. Collins [place], M.D.

## 2011-07-27 ENCOUNTER — Other Ambulatory Visit: Payer: Self-pay | Admitting: Gastroenterology

## 2011-08-09 ENCOUNTER — Other Ambulatory Visit: Payer: Self-pay | Admitting: Family Medicine

## 2011-08-09 DIAGNOSIS — Z1231 Encounter for screening mammogram for malignant neoplasm of breast: Secondary | ICD-10-CM

## 2011-08-20 ENCOUNTER — Ambulatory Visit
Admission: RE | Admit: 2011-08-20 | Discharge: 2011-08-20 | Disposition: A | Discharge status: Home / Self Care | Payer: BLUE CROSS/BLUE SHIELD | Source: Ambulatory Visit | Attending: Family Medicine | Admitting: Family Medicine

## 2011-08-20 DIAGNOSIS — Z1231 Encounter for screening mammogram for malignant neoplasm of breast: Secondary | ICD-10-CM

## 2011-08-20 IMAGING — MG MM DIGITAL SCREENING BILAT
4 series · 4 of 4 positions shown · non-contrast
Comparison: none

DG SCREEN MAMMOGRAM BILATERAL
Bilateral CC and MLO view(s) were taken.

DIGITAL SCREENING MAMMOGRAM WITH CAD:
There are scattered fibroglandular densities.  No masses or malignant type calcifications are 
identified.  Compared with prior studies.
Images were processed with CAD.

[R CC]
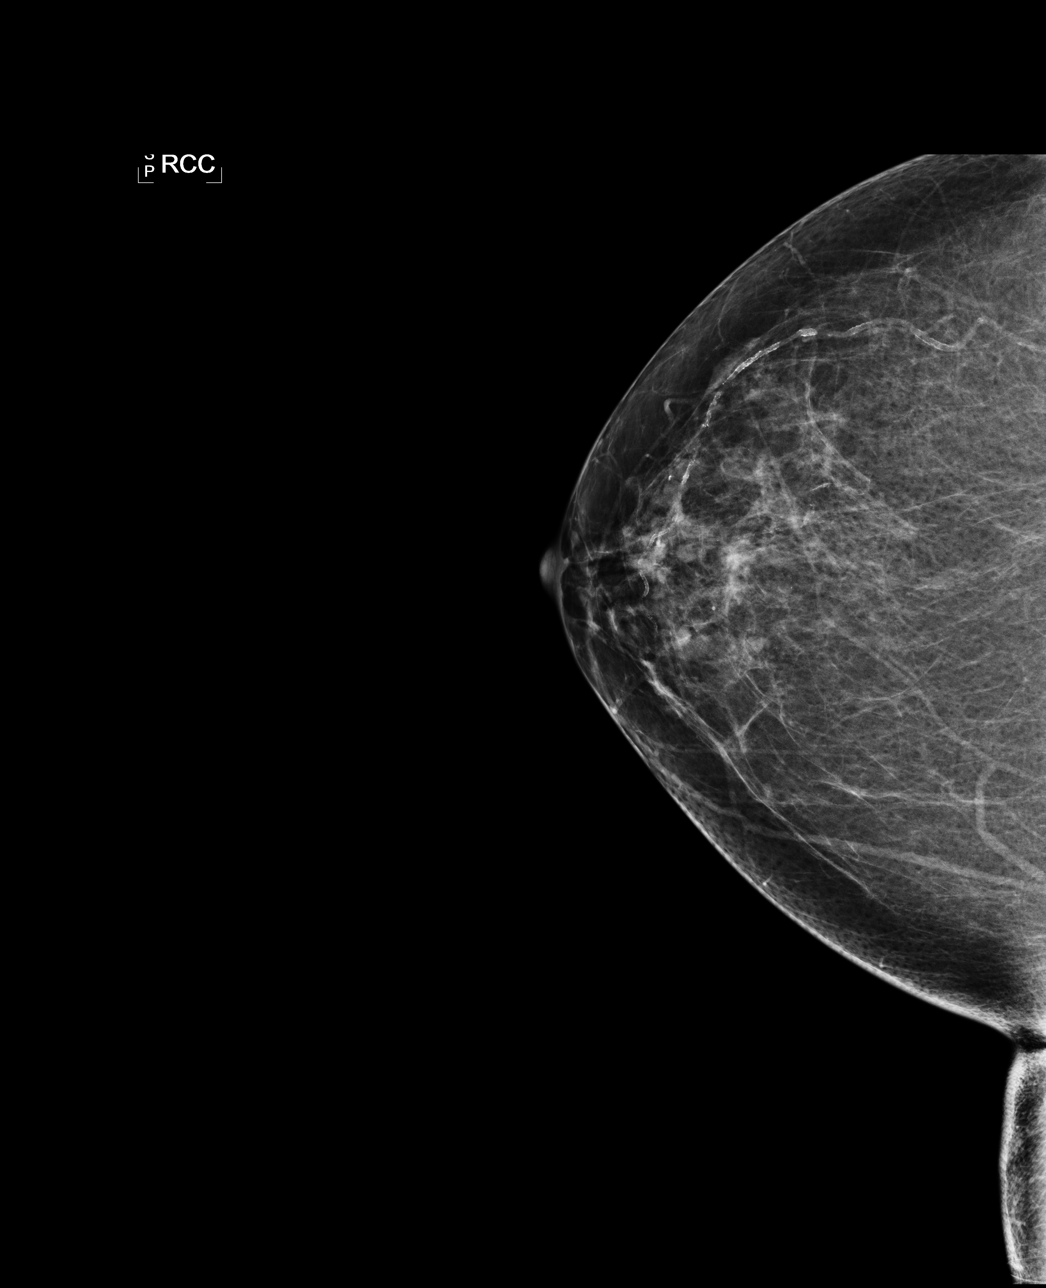

[L CC]
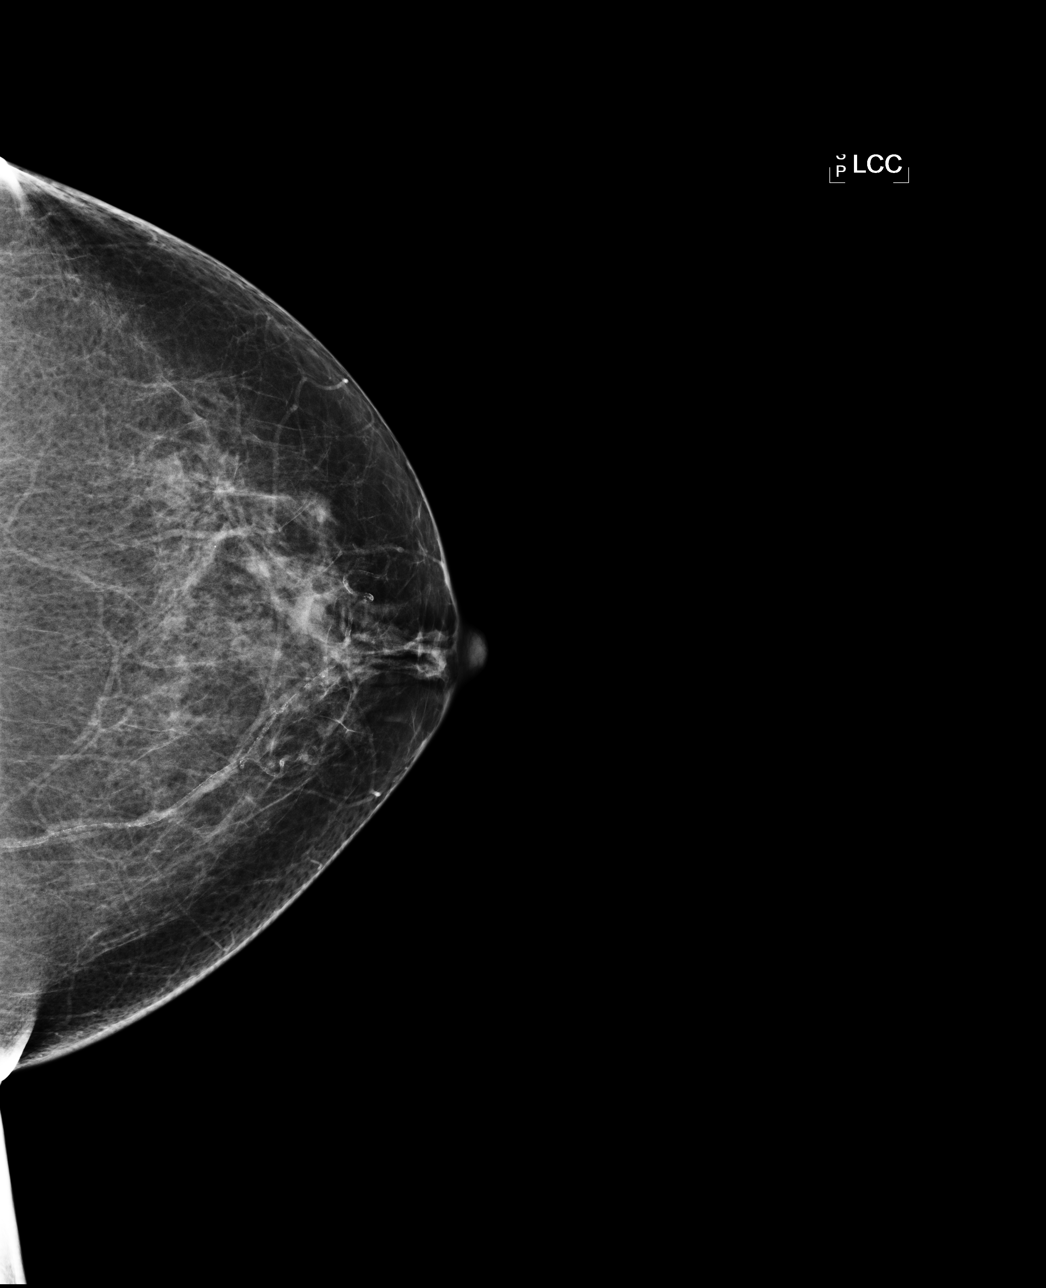

[L MLO]
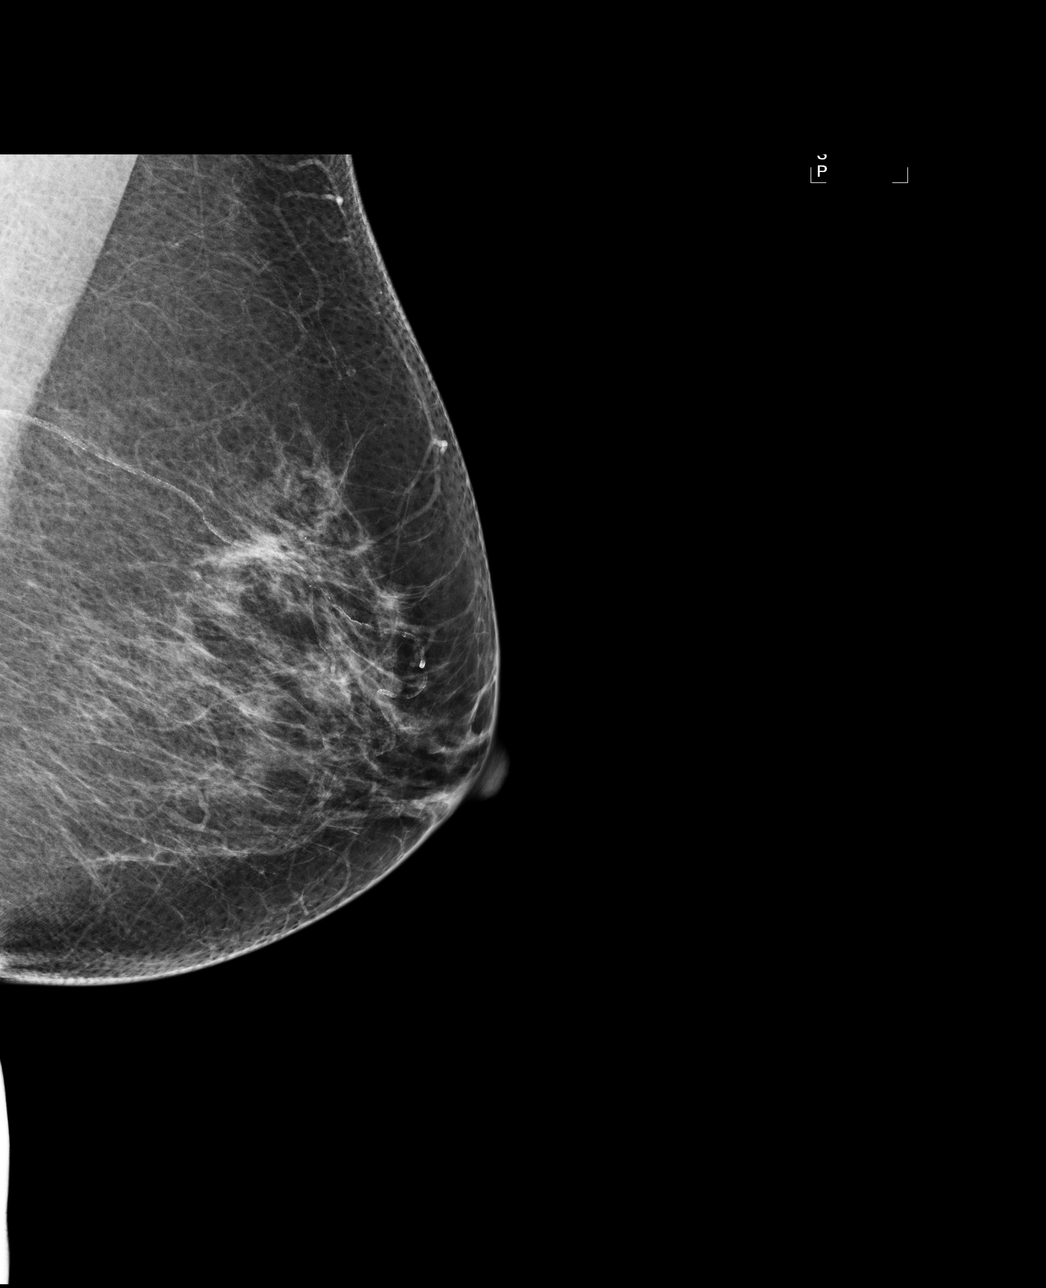

[R MLO]
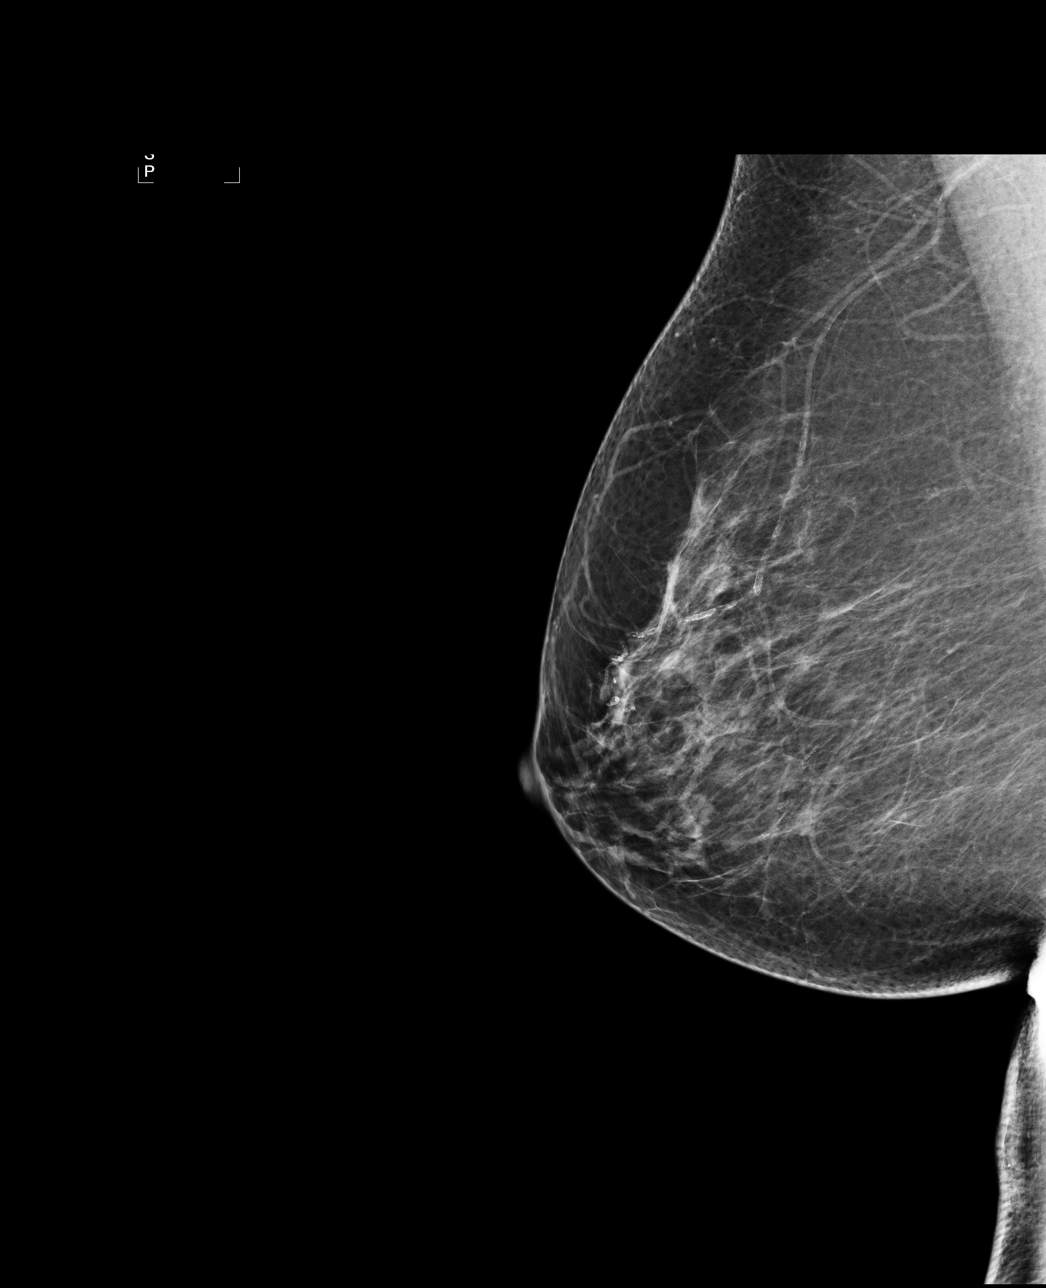

[4 of 4 positions shown; findings below may reference images not displayed]

IMPRESSION: No specific mammographic evidence of malignancy.  Next screening mammogram is recommended in one 
year.

A result letter of this screening mammogram will be mailed directly to the patient.

ASSESSMENT: Negative - BI-RADS 1

Screening mammogram in 1 year.
,

## 2012-07-24 ENCOUNTER — Other Ambulatory Visit: Payer: Self-pay | Admitting: Family Medicine

## 2012-07-24 DIAGNOSIS — N6322 Unspecified lump in the left breast, upper inner quadrant: Secondary | ICD-10-CM

## 2012-08-03 ENCOUNTER — Other Ambulatory Visit: Payer: BLUE CROSS/BLUE SHIELD

## 2012-08-16 ENCOUNTER — Ambulatory Visit
Admission: RE | Admit: 2012-08-16 | Discharge: 2012-08-16 | Disposition: A | Discharge status: Home / Self Care | Payer: Medicare Other | Source: Ambulatory Visit | Attending: Family Medicine | Admitting: Family Medicine

## 2012-08-16 ENCOUNTER — Other Ambulatory Visit: Payer: Self-pay | Admitting: Family Medicine

## 2012-08-16 DIAGNOSIS — N6322 Unspecified lump in the left breast, upper inner quadrant: Secondary | ICD-10-CM

## 2013-07-17 ENCOUNTER — Other Ambulatory Visit: Payer: Self-pay

## 2013-07-17 DIAGNOSIS — Z1231 Encounter for screening mammogram for malignant neoplasm of breast: Secondary | ICD-10-CM

## 2013-08-17 ENCOUNTER — Ambulatory Visit
Admission: RE | Admit: 2013-08-17 | Discharge: 2013-08-17 | Disposition: A | Discharge status: Home / Self Care | Payer: Medicare Other | Source: Ambulatory Visit

## 2013-08-17 DIAGNOSIS — Z1231 Encounter for screening mammogram for malignant neoplasm of breast: Secondary | ICD-10-CM

## 2013-08-22 ENCOUNTER — Other Ambulatory Visit: Payer: Self-pay | Admitting: Family Medicine

## 2013-08-22 DIAGNOSIS — R928 Other abnormal and inconclusive findings on diagnostic imaging of breast: Secondary | ICD-10-CM

## 2013-09-04 ENCOUNTER — Ambulatory Visit
Admission: RE | Admit: 2013-09-04 | Discharge: 2013-09-04 | Disposition: A | Discharge status: Home / Self Care | Payer: Medicare Other | Source: Ambulatory Visit | Attending: Family Medicine | Admitting: Family Medicine

## 2013-09-04 DIAGNOSIS — R928 Other abnormal and inconclusive findings on diagnostic imaging of breast: Secondary | ICD-10-CM

## 2013-09-04 IMAGING — MG MM DIAGNOSTIC UNILATERAL R
2 series · 2 of 2 positions shown · non-contrast
Comparison: [DATE] and earlier

CLINICAL DATA: The patient returns after screening study for
evaluation of the right breast.

EXAM:
DIGITAL DIAGNOSTIC  RIGHT MAMMOGRAM WITH CAD
ULTRASOUND RIGHT BREAST

[R CC]
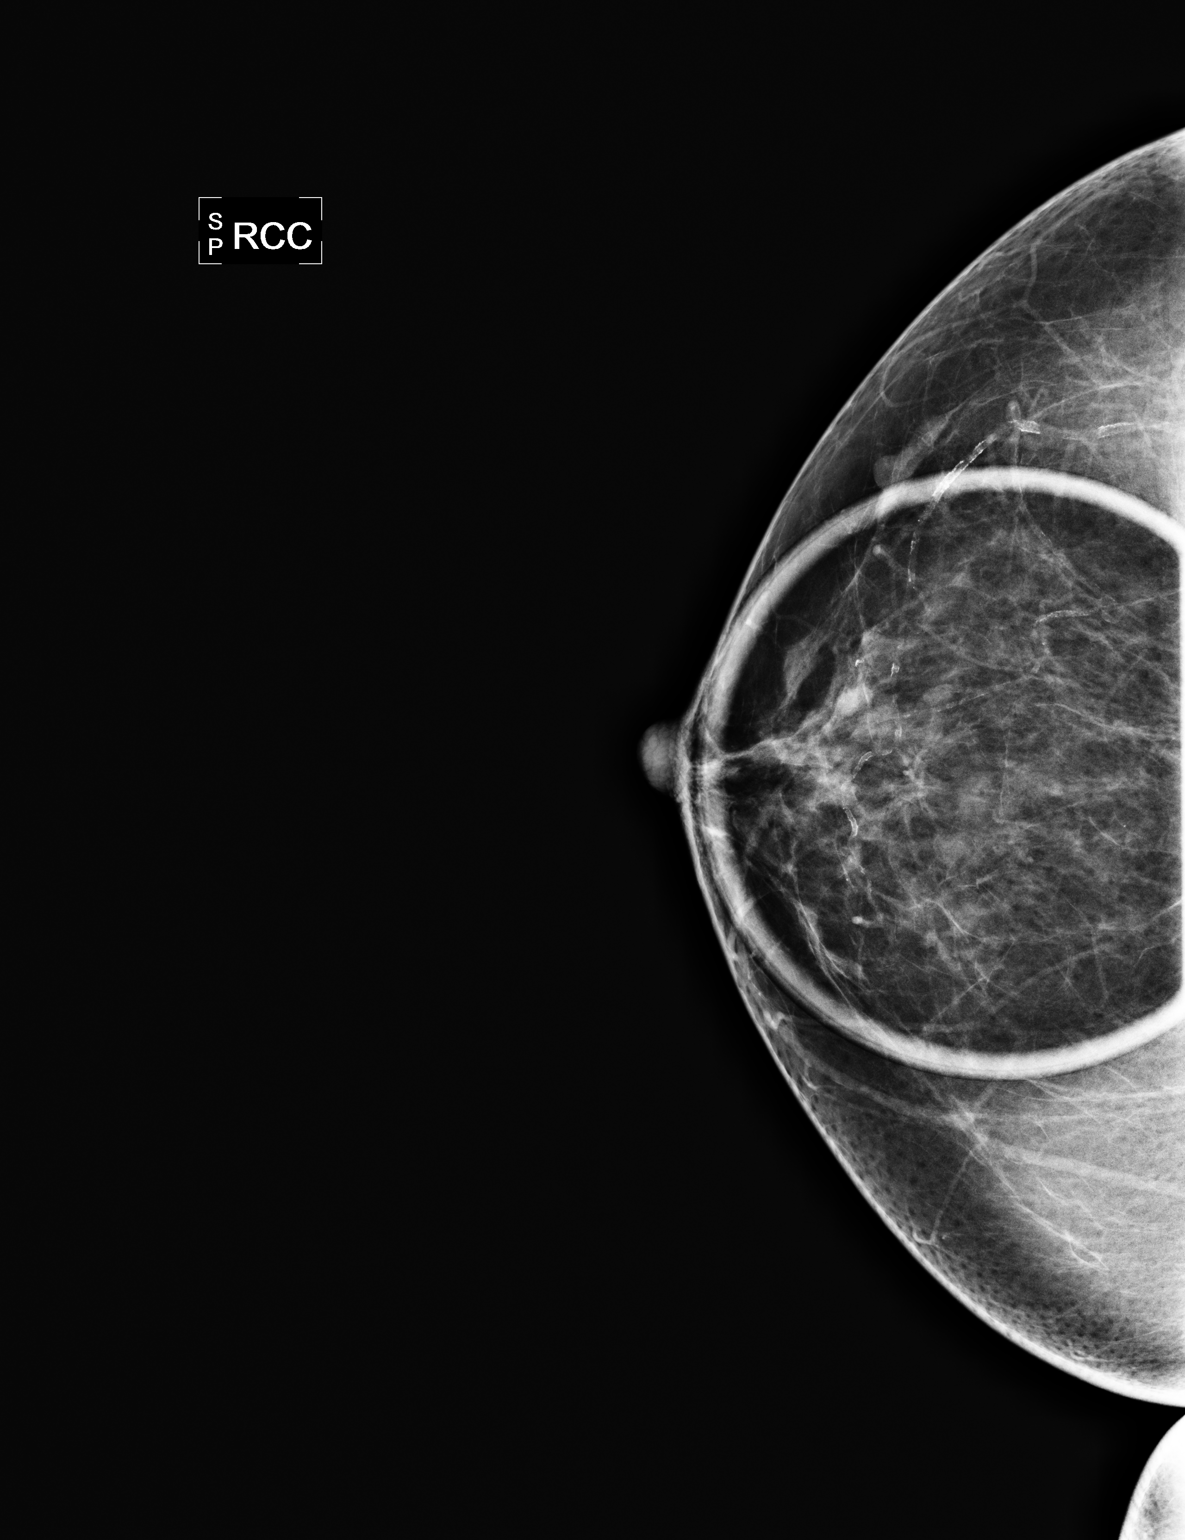

[R ML]
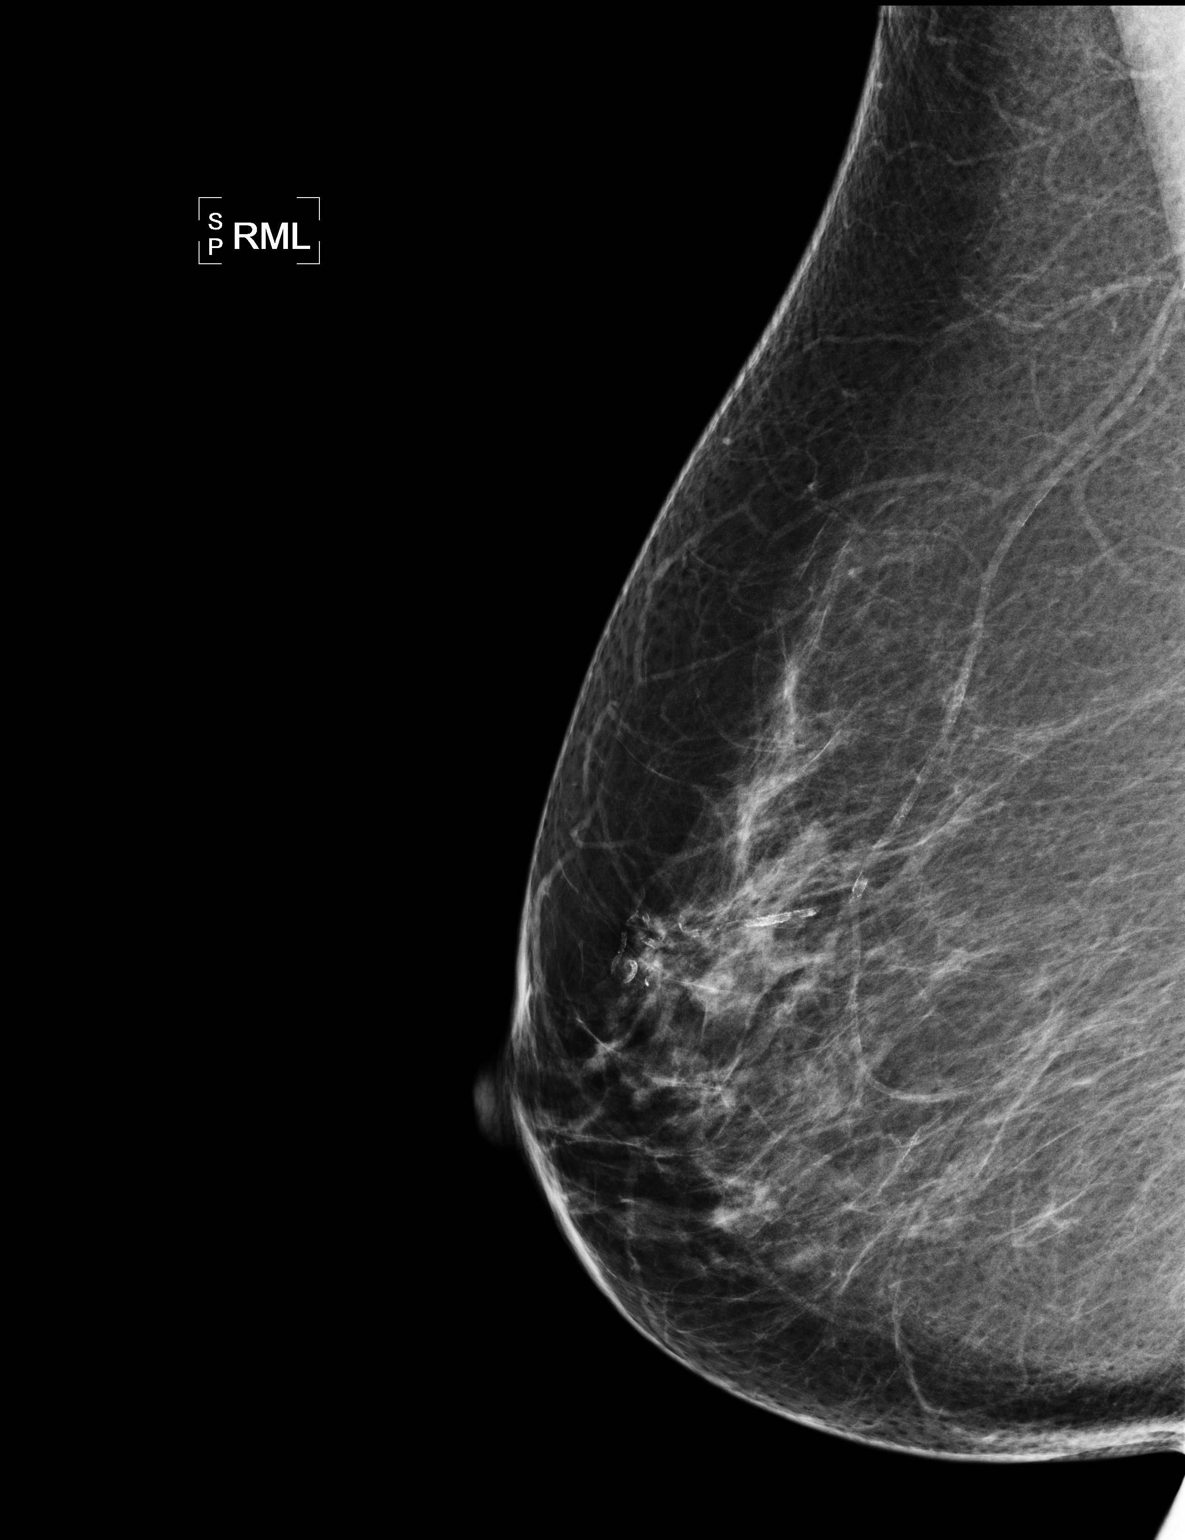

[2 of 2 positions shown; findings below may reference images not displayed]

ACR Breast Density Category b: There are scattered areas of
fibroglandular density.
FINDINGS: Additional views are performed, showing no persistent abnormality in
the central portion of the right breast.

Mammographic images were processed with CAD.

On physical exam, I palpate no abnormality in the central aspect of
the right breast.

Ultrasound is performed, showing scattered sub cm cysts within the
central portion of the right breast. No acoustic shadowing or
suspicious mass identified.
IMPRESSION: 1.  No mammographic or ultrasound evidence for malignancy.
2. No persistent area of concern within the right breast.

RECOMMENDATION:
Screening mammogram in one year.(Code:[ZX])

I have discussed the findings and recommendations with the patient.
Results were also provided in writing at the conclusion of the
visit. If applicable, a reminder letter will be sent to the patient
regarding the next appointment.

BI-RADS CATEGORY  1: Negative.

## 2014-08-05 ENCOUNTER — Other Ambulatory Visit: Payer: Self-pay

## 2014-08-05 DIAGNOSIS — Z1231 Encounter for screening mammogram for malignant neoplasm of breast: Secondary | ICD-10-CM

## 2014-08-21 ENCOUNTER — Ambulatory Visit
Admission: RE | Admit: 2014-08-21 | Discharge: 2014-08-21 | Disposition: A | Discharge status: Home / Self Care | Payer: Medicare Other | Source: Ambulatory Visit

## 2014-08-21 DIAGNOSIS — Z1231 Encounter for screening mammogram for malignant neoplasm of breast: Secondary | ICD-10-CM

## 2015-08-04 ENCOUNTER — Other Ambulatory Visit: Payer: Self-pay

## 2015-08-04 DIAGNOSIS — Z1231 Encounter for screening mammogram for malignant neoplasm of breast: Secondary | ICD-10-CM

## 2015-08-27 ENCOUNTER — Ambulatory Visit
Admission: RE | Admit: 2015-08-27 | Discharge: 2015-08-27 | Disposition: A | Discharge status: Home / Self Care | Payer: Medicare Other | Source: Ambulatory Visit

## 2015-08-27 DIAGNOSIS — Z1231 Encounter for screening mammogram for malignant neoplasm of breast: Secondary | ICD-10-CM

## 2015-10-13 ENCOUNTER — Ambulatory Visit (INDEPENDENT_AMBULATORY_CARE_PROVIDER_SITE_OTHER): Payer: Medicare Other | Admitting: Family Medicine

## 2015-10-13 VITALS — BP 118/78 | HR 81 | Temp 97.5°F | Resp 16 | Ht 60.0 in | Wt 150.4 lb

## 2015-10-13 DIAGNOSIS — R059 Cough, unspecified: Secondary | ICD-10-CM

## 2015-10-13 DIAGNOSIS — R05 Cough: Secondary | ICD-10-CM | POA: Diagnosis not present

## 2015-10-13 MED ORDER — BENZONATATE 100 MG PO CAPS: 100.0000 mg | ORAL_CAPSULE | Freq: Three times a day (TID) | ORAL | Status: DC | PRN

## 2015-10-13 NOTE — Patient Instructions (Addendum)
     IF you received an x-ray today, you will receive an invoice from [facility]. Please contact [facility] at [phone] with questions or concerns regarding your invoice.   IF you received labwork today, you will receive an invoice from [facility]. Please contact [facility] at [phone] with questions or concerns regarding your invoice.   Our billing staff will not be able to assist you with questions regarding bills from these companies.  You will be contacted with the lab results as soon as they are available. The fastest way to get your results is to activate your My Chart account. Instructions are located on the last page of this paperwork. If you have not heard from us regarding the results in 2 weeks, please contact this office.      Cough, Adult Coughing is a reflex that clears your throat and your airways. Coughing helps to heal and protect your lungs. It is normal to cough occasionally, but a cough that happens with other symptoms or lasts a long time may be a sign of a condition that needs treatment. A cough may last only 2-3 weeks (acute), or it may last longer than 8 weeks (chronic). CAUSES Coughing is commonly caused by:  Breathing in substances that irritate your lungs.  A viral or bacterial respiratory infection.  Allergies.  Asthma.  Postnasal drip.  Smoking.  Acid backing up from the stomach into the esophagus (gastroesophageal reflux).  Certain medicines.  Chronic lung problems, including COPD (or rarely, lung cancer).  Other medical conditions such as heart failure. HOME CARE INSTRUCTIONS  Pay attention to any changes in your symptoms. Take these actions to help with your discomfort:  Take medicines only as told by your health care provider.  If you were prescribed an antibiotic medicine, take it as told by your health care provider. Do not stop taking the antibiotic even if you start to feel  better.  Talk with your health care provider before you take a cough suppressant medicine.  Drink enough fluid to keep your urine clear or pale yellow.  If the air is dry, use a cold steam vaporizer or humidifier in your bedroom or your home to help loosen secretions.  Avoid anything that causes you to cough at work or at home.  If your cough is worse at night, try sleeping in a semi-upright position.  Avoid cigarette smoke. If you smoke, quit smoking. If you need help quitting, ask your health care provider.  Avoid caffeine.  Avoid alcohol.  Rest as needed. SEEK MEDICAL CARE IF:   You have new symptoms.  You cough up pus.  Your cough does not get better after 2-3 weeks, or your cough gets worse.  You cannot control your cough with suppressant medicines and you are losing sleep.  You develop pain that is getting worse or pain that is not controlled with pain medicines.  You have a fever.  You have unexplained weight loss.  You have night sweats. SEEK IMMEDIATE MEDICAL CARE IF:  You cough up blood.  You have difficulty breathing.  Your heartbeat is very fast.   This information is not intended to replace advice given to you by your health care provider. Make sure you discuss any questions you have with your health care provider.   Document Released: 12/18/2010 Document Revised: 03/12/2015 Document Reviewed: 08/28/2014 Elsevier Interactive Patient Education 2016 [employer]  

## 2015-10-13 NOTE — Progress Notes (Signed)
[facility] [address], [place], [place] [address] [phone]  Date:  10/13/2015   Name:  Hannah Bates   DOB:  Sep 01, 1933   MRN:  [id number]  PCP:  No primary care provider on file.    Chief Complaint: Cough; chest congestion; and Wheezing   History of Present Illness:  Hannah Bates is a 82 y.o. very pleasant female patient who presents with the following: Cough:  - Began 8 days ago, slightly improving, but coming in due to family concerns.  - Course cough all week - Waking her up  - Tried mucinex & delsym with minimal if any improvement.  - No history of lung dz.  Diagnosed with COPD due to asbestos exposure, but no symptoms or history of medications.   - Otherwise doing well. No SoB or wheezing.  - No recent abx exposure or hospitalizations in the last few months. Overall relatively healthy.   Denies any history of CABG, modified in chart.    Patient Active Problem List   Diagnosis Date Noted  . GERD 02/10/2007  . OSTEOARTHRITIS 02/10/2007  . OSTEOPOROSIS 02/10/2007    Past Medical History  Diagnosis Date  . Arthritis   . Osteoporosis     Past Surgical History  Procedure Laterality Date  . Coronary artery bypass graft    . Eye surgery    . Finger ganglion cyst excision      rt thumb    Social History  Substance Use Topics  . Smoking status: Never Smoker   . Smokeless tobacco: None  . Alcohol Use: No    Family History  Problem Relation Age of Onset  . Hyperlipidemia Mother   . Heart disease Father   . Hyperlipidemia Father   . Hypertension Father   . Cancer Sister   . Heart disease Sister   . Hyperlipidemia Sister   . Hypertension Sister     Allergies  Allergen Reactions  . Codeine     REACTION: Rash  . Sulfonamide Derivatives     REACTION: Rash    Medication list has been reviewed and updated.  No current outpatient prescriptions on file prior to visit.    No current facility-administered medications on file prior to visit.    Review of Systems: Review of Systems  Constitutional: Negative for fever and chills.  HENT: Positive for sore throat (irritated, mild). Negative for congestion (only transiently when outside. ), ear discharge and ear pain.   Eyes: Negative for blurred vision and pain.  Respiratory: Positive for cough, sputum production and wheezing (possibly). Negative for shortness of breath.   Cardiovascular: Negative for chest pain and palpitations.  Gastrointestinal: Negative for nausea, vomiting, diarrhea and constipation.  Genitourinary: Negative for dysuria, urgency and frequency.  Musculoskeletal: Negative for myalgias.  Neurological: Negative for dizziness.    Physical Examination: Filed Vitals:   10/13/15 1024  BP: 118/78  Pulse: 81  Temp: 97.5 F (36.4 C)  Resp: 16   Filed Vitals:   10/13/15 1024  Height: 5' (1.524 m)  Weight: 150 lb 6.4 oz (68.221 kg)   Body mass index is 29.37 kg/(m^2). Ideal Body Weight: Weight in (lb) to have BMI = 25: 127.7  GEN: WDWN, NAD, Non-toxic, A & O x 3 HEENT: Atraumatic, Normocephalic. Neck supple. No masses, No LAD. Ears and Nose: No external deformity. CV: RRR, No M/G/R. No JVD. No thrill. No extra heart sounds. PULM: CTA B, no  wheezes, crackles, rhonchi. No retractions. No resp. distress. No accessory muscle use. Occasional dry cough.  ABD: S, NT, ND, +BS. No rebound. No HSM. EXTR: No c/c/e NEURO Normal gait.  PSYCH: Normally interactive. Conversant. Not depressed or anxious appearing.  Calm demeanor.   Assessment and Plan: Cough x 8 days, slightly improving last few days: Normal vitals and exam other than rare cough. Discussed longevity of cough.  Will provide tessalon perles as needed. F/u with any worsening symptoms.  Call with any questions.   Patient and husband agreeable with plan.    Signed Gerre Pebbles, MD

## 2015-10-30 ENCOUNTER — Ambulatory Visit
Admission: RE | Admit: 2015-10-30 | Discharge: 2015-10-30 | Disposition: A | Discharge status: Home / Self Care | Payer: Medicare Other | Source: Ambulatory Visit | Attending: Family Medicine | Admitting: Family Medicine

## 2015-10-30 ENCOUNTER — Other Ambulatory Visit: Payer: Self-pay | Admitting: Family Medicine

## 2015-10-30 DIAGNOSIS — M79642 Pain in left hand: Secondary | ICD-10-CM

## 2015-10-30 IMAGING — CR DG HAND COMPLETE 3+V*L*
3 series · 3 of 3 positions shown · non-contrast
Comparison: None.

CLINICAL DATA: Patient c/o pain at 5th metacarpal x 6 days;
patients hand was struck with soft ball; skin tear at 5th MCP joint

EXAM:
LEFT HAND - COMPLETE 3+ VIEW

[x hand pa left]
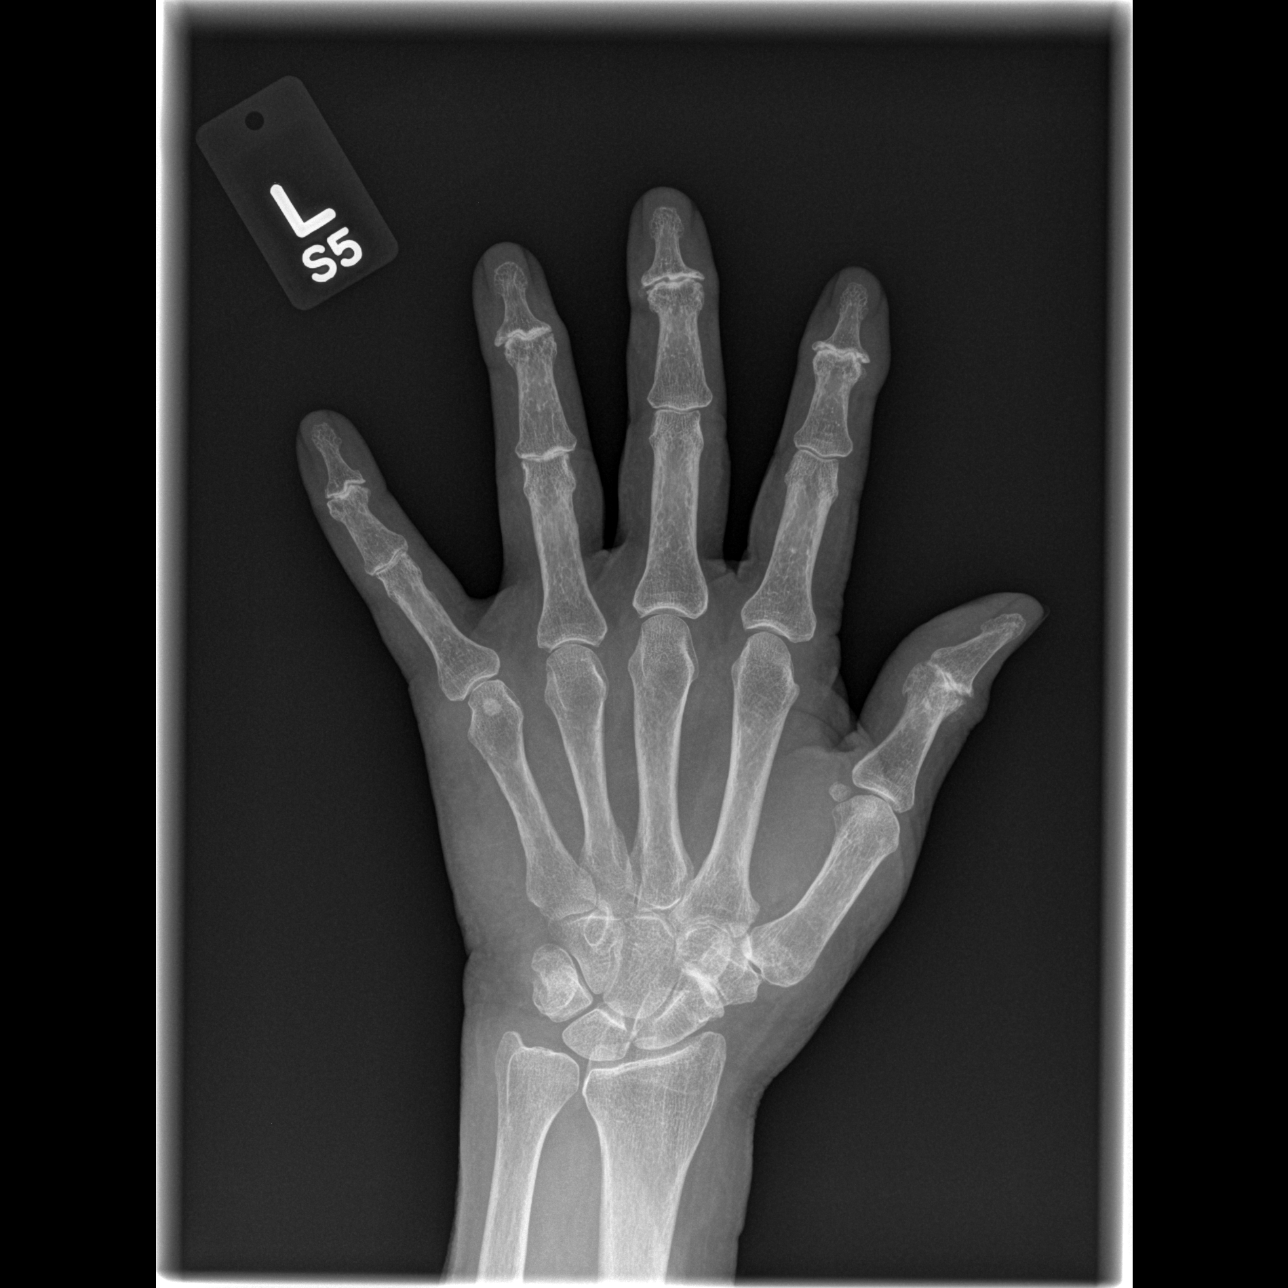

[x hand oblique left]
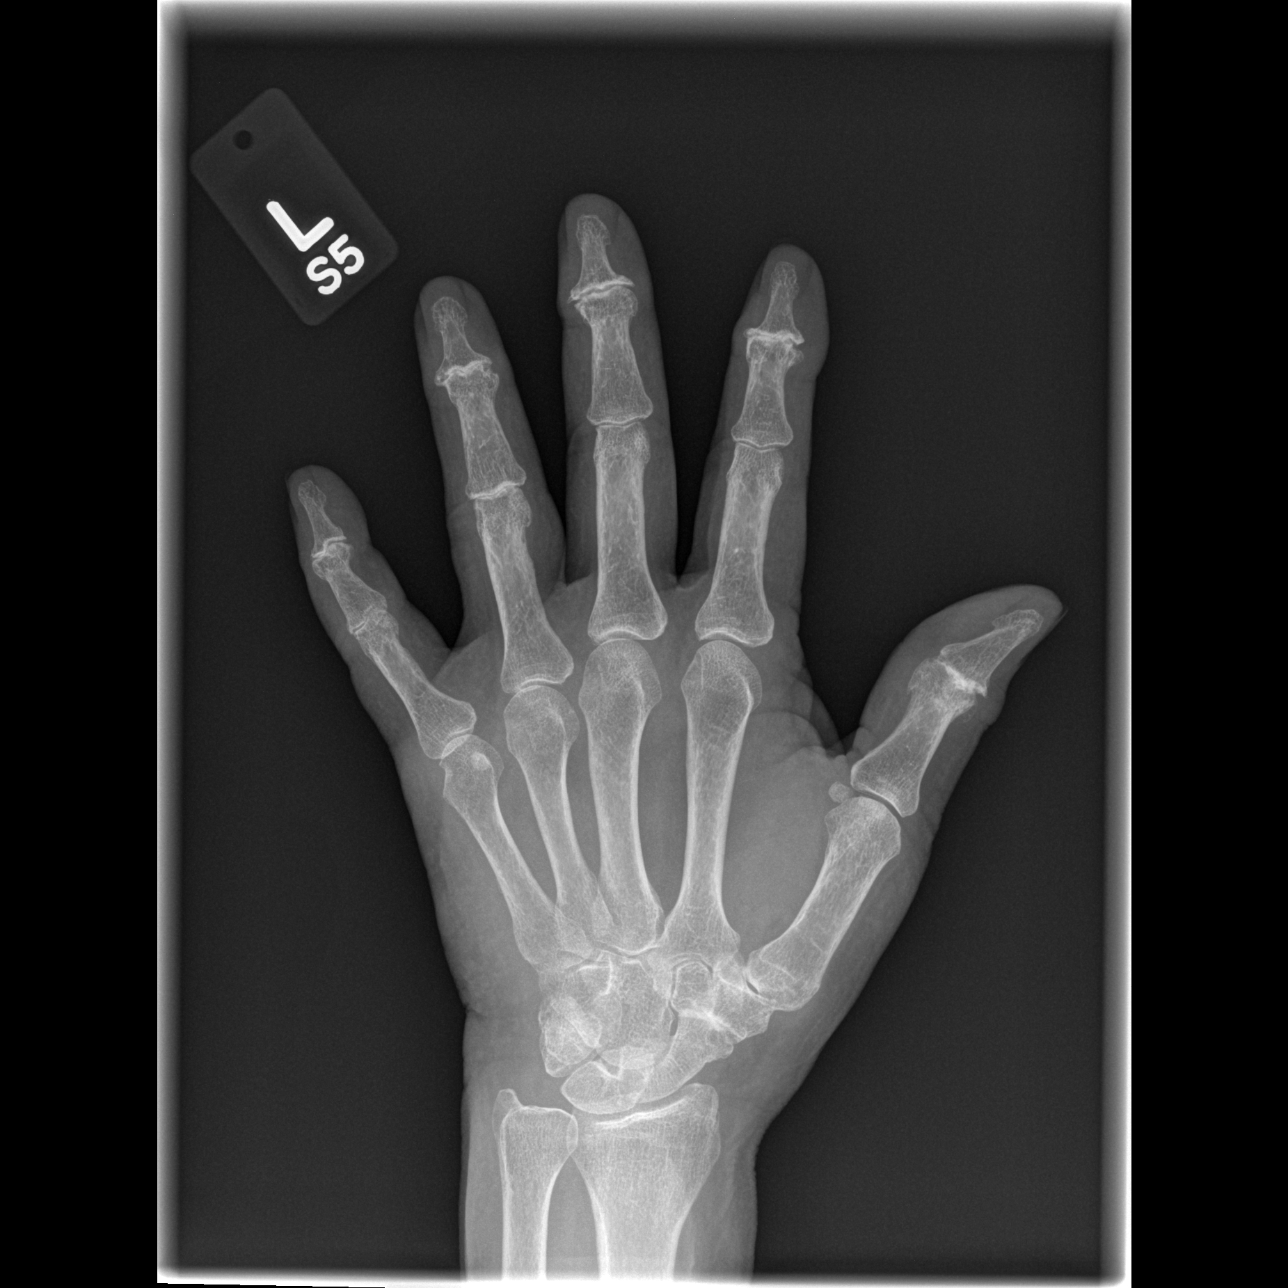

[x hand lat left]
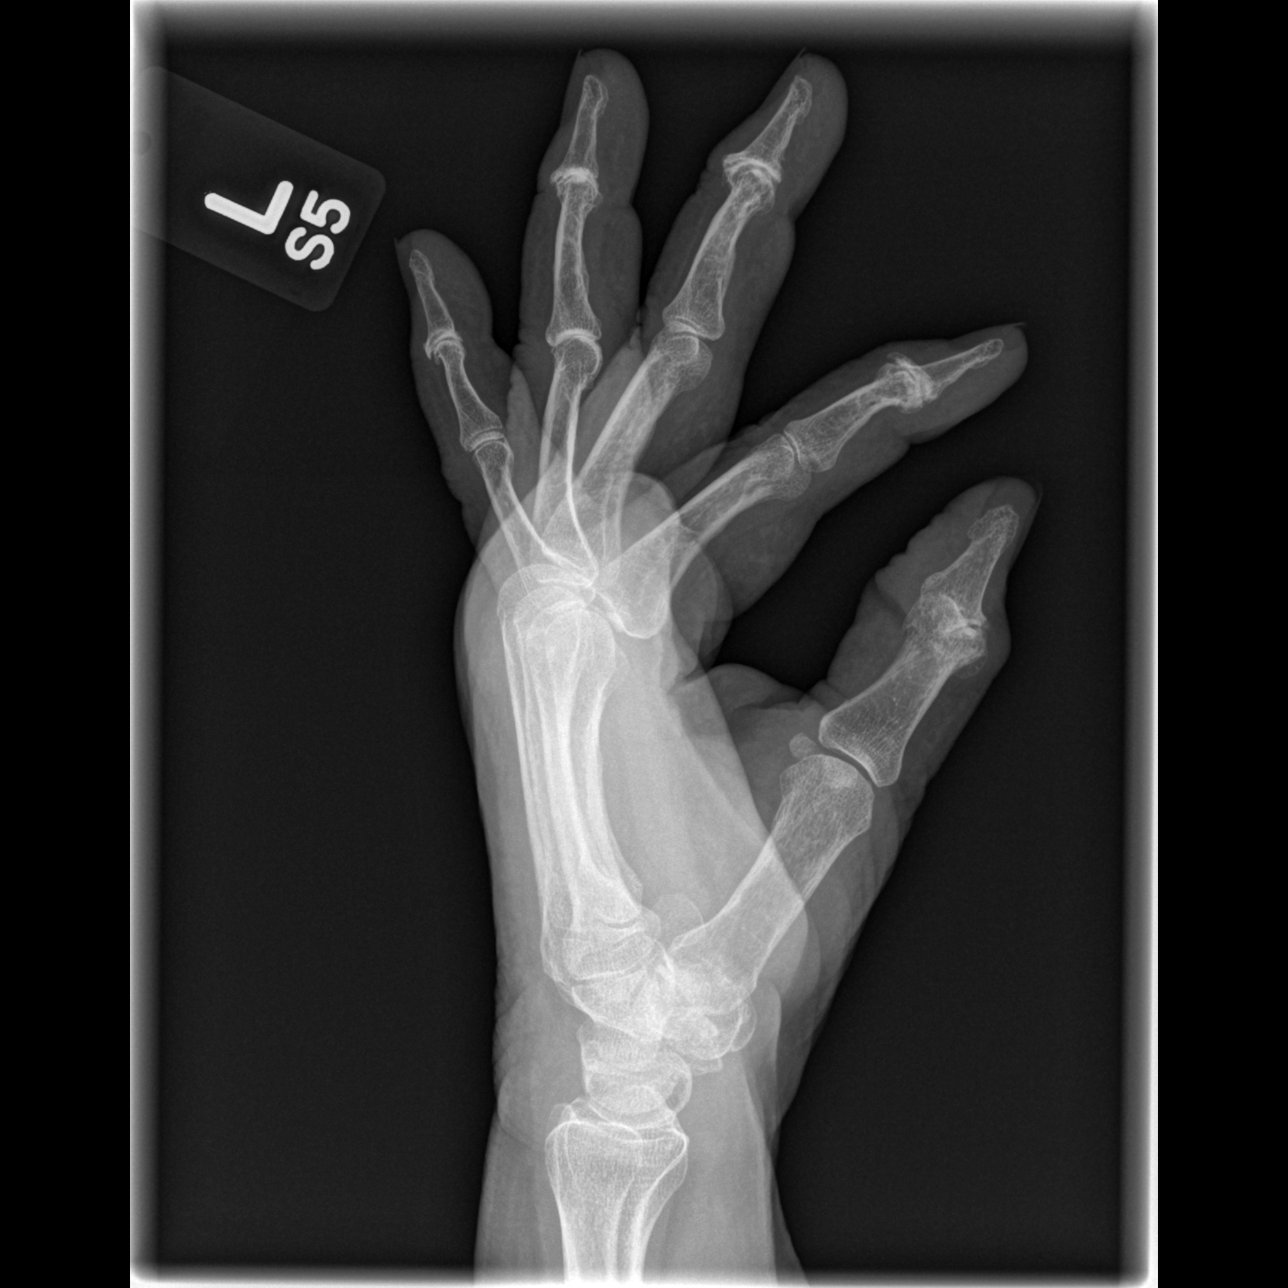

[3 of 3 positions shown; findings below may reference images not displayed]

FINDINGS: No fracture.  No dislocation.

Bones are demineralized.

There are changes of osteoarthritis which predominantly involve the
DIP joints of the fingers and IP joint the thumb, with asymmetric
joint space narrowing, subchondral sclerosis and marginal
osteophytes. Osteoarthritis also noted at the scaphoid trapezium
trapezoid articulation and first carpal metacarpal articulation.

Soft tissues are unremarkable.
IMPRESSION: 1. No fracture or dislocation.

## 2016-03-24 ENCOUNTER — Other Ambulatory Visit: Payer: Self-pay | Admitting: Family Medicine

## 2016-03-24 DIAGNOSIS — M810 Age-related osteoporosis without current pathological fracture: Secondary | ICD-10-CM

## 2016-03-29 ENCOUNTER — Encounter (HOSPITAL_COMMUNITY): Payer: Self-pay | Admitting: Emergency Medicine

## 2016-03-29 ENCOUNTER — Emergency Department (HOSPITAL_COMMUNITY)
Admission: EM | Admit: 2016-03-29 | Discharge: 2016-03-29 | Disposition: A | Discharge status: Home / Self Care | Payer: Medicare Other | Attending: Emergency Medicine | Admitting: Emergency Medicine

## 2016-03-29 DIAGNOSIS — R002 Palpitations: Secondary | ICD-10-CM

## 2016-03-29 DIAGNOSIS — Z7982 Long term (current) use of aspirin: Secondary | ICD-10-CM | POA: Insufficient documentation

## 2016-03-29 LAB — CBC WITH DIFFERENTIAL/PLATELET
Basophils Absolute: 0.1 K/uL (ref 0.0–0.1)
Basophils Relative: 2 %
Eosinophils Absolute: 0.2 K/uL (ref 0.0–0.7)
Eosinophils Relative: 3 %
HCT: 47 % — ABNORMAL HIGH (ref 36.0–46.0)
Hemoglobin: 15.5 g/dL — ABNORMAL HIGH (ref 12.0–15.0)
Lymphocytes Relative: 28 %
Lymphs Abs: 2.3 K/uL (ref 0.7–4.0)
MCH: 32.8 pg (ref 26.0–34.0)
MCHC: 33 g/dL (ref 30.0–36.0)
MCV: 99.4 fL (ref 78.0–100.0)
Monocytes Absolute: 0.8 K/uL (ref 0.1–1.0)
Monocytes Relative: 9 %
Neutro Abs: 4.7 K/uL (ref 1.7–7.7)
Neutrophils Relative %: 58 %
Platelets: 273 K/uL (ref 150–400)
RBC: 4.73 MIL/uL (ref 3.87–5.11)
RDW: 13.1 % (ref 11.5–15.5)
WBC: 8.1 K/uL (ref 4.0–10.5)

## 2016-03-29 LAB — BASIC METABOLIC PANEL WITH GFR
Anion gap: 8 (ref 5–15)
BUN: 15 mg/dL (ref 6–20)
CO2: 26 mmol/L (ref 22–32)
Calcium: 9.4 mg/dL (ref 8.9–10.3)
Chloride: 107 mmol/L (ref 101–111)
Creatinine, Ser: 1 mg/dL (ref 0.44–1.00)
GFR calc Af Amer: >60 mL/min
GFR calc non Af Amer: 52 mL/min — ABNORMAL LOW
Glucose, Bld: 91 mg/dL (ref 65–99)
Potassium: 3.8 mmol/L (ref 3.5–5.1)
Sodium: 141 mmol/L (ref 135–145)

## 2016-03-29 LAB — I-STAT TROPONIN, ED: Troponin i, poc: 0 ng/mL (ref 0.00–0.08)

## 2016-03-29 NOTE — Discharge Instructions (Signed)
Follow up with cardiology as soon as possible for re-evaluation. Return to the ED if you experience chest pain, difficulty breathing, loss of consciousness, dizziness, or weakness.

## 2016-03-29 NOTE — ED Triage Notes (Signed)
Pt here with "cheset fluttering" since approx 3pm today. Pt denies pain, nausea, SOB, any other symptoms. Pt denoes dizziness.

## 2016-03-29 NOTE — ED Provider Notes (Signed)
[facility] DEPT Provider Note   CSN: [id number] Arrival date & time: 03/29/16  1733     History   Chief Complaint Chief Complaint  Patient presents with  . Palpitations  . Irregular Heart Beat    HPI Hannah Bates is a 82 y.o. female with a past medical history of HLD, GERD who presents to the ED today complaining of heart palpitations. Patient states that around 3:30 this afternoon she was washing clothes when she began feeling like her heart was skipping a beat. Patient states the symptoms last for seconds to minutes at a time and then resolve. She states that she checked her blood pressure at home and "could tell on the monitor that her heart was not beating appropriately". She states she then went to the fire station for evaluation and was told by the EMTs that she was having PVCs so she came to the ED for further evaluation. Patient does report mild chest tightness but no pain or shortness of breath. Patient denies any near-syncope, dizziness, blurry vision, weakness, paresthesias. Patient has never experienced similar symptoms in the past. She is not anticoagulated.  HPI  Past Medical History:  Diagnosis Date  . Arthritis   . Osteoporosis     Patient Active Problem List   Diagnosis Date Noted  . GERD 02/10/2007  . OSTEOARTHRITIS 02/10/2007  . OSTEOPOROSIS 02/10/2007    Past Surgical History:  Procedure Laterality Date  . CHOLECYSTECTOMY    . EYE SURGERY    . FINGER GANGLION CYST EXCISION     rt thumb    OB History    No data available       Home Medications    Prior to Admission medications   Medication Sig Start Date End Date Taking? Authorizing Provider  Acetaminophen (TYLENOL PO) Take by mouth.    Historical Provider, MD  aspirin 81 MG tablet Take 81 mg by mouth daily.    Historical Provider, MD  atorvastatin (LIPITOR) 20 MG tablet Take 20 mg by mouth daily.    Historical Provider, MD  benzonatate (TESSALON) 100 MG capsule Take 1-2 capsules  (100-200 mg total) by mouth 3 (three) times daily as needed for cough. 10/13/15   Gerre Pebbles, MD  Cholecalciferol (VITAMIN D3) 3000 units TABS Take by mouth.    Historical Provider, MD  Loratadine (CLARITIN PO) Take by mouth.    Historical Provider, MD  omeprazole (PRILOSEC) 20 MG capsule Take 20 mg by mouth daily.    Historical Provider, MD  ranitidine (ZANTAC) 150 MG tablet Take 150 mg by mouth 2 (two) times daily.    Historical Provider, MD    Family History Family History  Problem Relation Age of Onset  . Hyperlipidemia Mother   . Heart disease Father   . Hyperlipidemia Father   . Hypertension Father   . Cancer Sister   . Heart disease Sister   . Hyperlipidemia Sister   . Hypertension Sister     Social History Social History  Substance Use Topics  . Smoking status: Never Smoker  . Smokeless tobacco: Never Used  . Alcohol use No     Allergies   Codeine and Sulfonamide derivatives   Review of Systems Review of Systems  All other systems reviewed and are negative.    Physical Exam Updated Vital Signs BP 177/84   Pulse 78   Temp 97.9 F (36.6 C) (Oral)   Resp 25   Ht 5' (1.524 m)   Wt 68 kg   SpO2  96%   BMI 29.29 kg/m   Physical Exam  Constitutional: She is oriented to person, place, and time. She appears well-developed and well-nourished. No distress.  HENT:  Head: Normocephalic and atraumatic.  Mouth/Throat: No oropharyngeal exudate.  Eyes: Conjunctivae and EOM are normal. Pupils are equal, round, and reactive to light. Right eye exhibits no discharge. Left eye exhibits no discharge. No scleral icterus.  Neck: Normal range of motion. Neck supple. No JVD present.  No carotid bruits  Cardiovascular: Normal rate, regular rhythm, normal heart sounds and intact distal pulses.  Exam reveals no gallop and no friction rub.   No murmur heard. Pulmonary/Chest: Effort normal and breath sounds normal. No respiratory distress. She has no wheezes. She has no  rales. She exhibits no tenderness.  Abdominal: Soft. Bowel sounds are normal. She exhibits no distension. There is no tenderness. There is no guarding.  Musculoskeletal: Normal range of motion. She exhibits no edema.  Neurological: She is alert and oriented to person, place, and time.  Skin: Skin is warm and dry. No rash noted. She is not diaphoretic. No erythema. No pallor.  Psychiatric: She has a normal mood and affect. Her behavior is normal.  Nursing note and vitals reviewed.    ED Treatments / Results  Labs (all labs ordered are listed, but only abnormal results are displayed) Labs Reviewed  BASIC METABOLIC PANEL  CBC WITH DIFFERENTIAL/PLATELET  Randolm Idol, ED    EKG  EKG Interpretation  Date/Time:  Monday March 29 2016 17:41:37 EDT Ventricular Rate:  86 PR Interval:    QRS Duration: 95 QT Interval:  396 QTC Calculation: 474 R Axis:   29 Text Interpretation:  Sinus rhythm Abnormal R-wave progression, early transition Confirmed by Wilson Singer  MD, Annie Main [id number]) on 03/29/2016 6:08:59 PM       Radiology No results found.  Procedures Procedures (including critical care time)  Medications Ordered in ED Medications - No data to display   Initial Impression / Assessment and Plan / ED Course  I have reviewed the triage vital signs and the nursing notes.  Pertinent labs & imaging results that were available during my care of the patient were reviewed by me and considered in my medical decision making (see chart for details).  Clinical Course    82 year old female with history of HLD presents to the ED today complaining of palpitations. Patient states that she has had intermittent symptoms of feeling like "her heart is skipping a beat" since 3:30 this afternoon. Patient appears well in the ED, in no apparent distress. All vital signs are stable. EKG unremarkable. However, while in the ED patient notified us that she was having symptoms and we collected a spot EKG  during the time of her symptoms which reveals what looks like PACs. Troponin and other lab work are unremarkable. Do not feel that anything life threatening or emergent is causing her symptoms at this time. The patient is safe for discharge with cardiology follow-up. Patient may require Holter monitoring for further evaluation. Discussed this with patient and family members who express understanding and are agreeable to treatment plan. Return precautions outlined in patient discharge instructions.  Patient was discussed with and seen by Dr. Wilson Singer who agrees with the treatment plan.    Final Clinical Impressions(s) / ED Diagnoses   Final diagnoses:  Palpitations    New Prescriptions New Prescriptions   No medications on file     Carlos Levering, PA-C 03/29/16 1941    Virgel Manifold, MD 03/31/16  1508  

## 2016-03-30 ENCOUNTER — Encounter: Payer: Self-pay | Admitting: Interventional Cardiology

## 2016-03-30 DIAGNOSIS — R002 Palpitations: Secondary | ICD-10-CM | POA: Insufficient documentation

## 2016-03-31 ENCOUNTER — Ambulatory Visit (INDEPENDENT_AMBULATORY_CARE_PROVIDER_SITE_OTHER): Payer: Medicare Other | Admitting: Interventional Cardiology

## 2016-03-31 ENCOUNTER — Encounter: Payer: Self-pay | Admitting: Interventional Cardiology

## 2016-03-31 VITALS — BP 152/84 | HR 84 | Ht 60.0 in | Wt 152.2 lb

## 2016-03-31 DIAGNOSIS — R0789 Other chest pain: Secondary | ICD-10-CM | POA: Diagnosis not present

## 2016-03-31 DIAGNOSIS — R002 Palpitations: Secondary | ICD-10-CM | POA: Diagnosis not present

## 2016-03-31 NOTE — Progress Notes (Signed)
Cardiology Office Note    Date:  03/31/2016   ID:  [name], DOB 1933-08-01, MRN [id number]  PCP:  Hannah Cipro, MD  Cardiologist: Sinclair Grooms, MD   Chief Complaint  Patient presents with  . Palpitations    History of Present Illness:  Hannah Bates is a 82 y.o. female who is referred for evaluation of palpitations.  She was seen in the emergency room 2 days ago because of palpitations and the feeling that her heartbeat was irregular. This was confirmed by the digital blood pressure recorder. Her blood pressure was somewhat elevated. The irregularity in heart beat occurred after some cleaning that she was doing. There was no associated chest pain. She went to the fire department where they saw "PVCs". Because of this they referred her to the emergency department where she was evaluated on telemetry and had delta troponin levels performed. No abnormalities were found.  Since this episode she has felt that there is mild tightness in her chest continuously. It does not vary with activity. She is not short of breath. She has had no recurrence of palpitations. She had never had previous episodes of palpitation such as this. Her mother had atrial fibrillation  Past Medical History:  Diagnosis Date  . Arthritis   . Osteoporosis     Past Surgical History:  Procedure Laterality Date  . CHOLECYSTECTOMY    . EYE SURGERY    . FINGER GANGLION CYST EXCISION     rt thumb    Current Medications: Outpatient Medications Prior to Visit  Medication Sig Dispense Refill  . Acetaminophen (TYLENOL PO) Take 1,000 mg by mouth daily as needed (pain/headache).     Marland Kitchen aspirin 81 MG tablet Take 81 mg by mouth daily.    Marland Kitchen atorvastatin (LIPITOR) 20 MG tablet Take 20 mg by mouth daily.    . Loratadine (CLARITIN PO) Take 1 tablet by mouth daily as needed (allergies).     . ranitidine (ZANTAC) 150 MG tablet Take 150 mg by mouth 2 (two) times daily.    . benzonatate (TESSALON) 100  MG capsule Take 1-2 capsules (100-200 mg total) by mouth 3 (three) times daily as needed for cough. (Patient not taking: Reported on 03/31/2016) 40 capsule 0  . Cholecalciferol (VITAMIN D3) 3000 units TABS Take by mouth.    Marland Kitchen omeprazole (PRILOSEC) 20 MG capsule Take 20 mg by mouth daily.     No facility-administered medications prior to visit.      Allergies:   Codeine and Sulfonamide derivatives   Social History   Social History  . Marital status: Married    Spouse name: N/A  . Number of children: N/A  . Years of education: N/A   Social History Main Topics  . Smoking status: Never Smoker  . Smokeless tobacco: Never Used  . Alcohol use No  . Drug use: No  . Sexual activity: Not Asked   Other Topics Concern  . None   Social History Narrative  . None     Family History:  The patient's family history includes Cancer in her sister; Heart disease in her father and sister; Hyperlipidemia in her father, mother, and sister; Hypertension in her father and sister.   ROS:   Please see the history of present illness.    Occasional lower extremity swelling. She has less energy with physical activity now than she did 6-12 months ago. She occasionally has back pain. Easy bruising.  All other systems reviewed and are negative.  PHYSICAL EXAM:   VS:  BP (!) 152/84   Pulse 84   Ht 5' (1.524 m)   Wt 152 lb 3.2 oz (69 kg)   BMI 29.72 kg/m    GEN: Well nourished, well developed, in no acute distress . Appears than her stated age. HEENT: normal  Neck: no JVD, carotid bruits, or masses Cardiac: RRR; no murmurs, rubs, or gallops,no edema  Respiratory:  clear to auscultation bilaterally, normal work of breathing GI: soft, nontender, nondistended, + BS MS: no deformity or atrophy  Skin: warm and dry, no rash Neuro:  Alert and Oriented x 3, Strength and sensation are intact Psych: euthymic mood, full affect  Wt Readings from Last 3 Encounters:  03/31/16 152 lb 3.2 oz (69 kg)  03/29/16  150 lb (68 kg)  10/13/15 150 lb 6.4 oz (68.2 kg)      Studies/Labs Reviewed:   EKG:  EKG  EKG performed on 03/29/2016 is normal.  Recent Labs: 03/29/2016: BUN 15; Creatinine, Ser 1.00; Hemoglobin 15.5; Platelets 273; Potassium 3.8; Sodium 141   Lipid Panel No results found for: CHOL, TRIG, HDL, CHOLHDL, VLDL, LDLCALC, LDLDIRECT  Additional studies/ records that were reviewed today include:  Cardiac markers were unremarkable. Other laboratory data is normal. Potassium was 3.8. Hemoglobin is normal at 15.5.    ASSESSMENT:    1. Heart palpitations   2. Chest heaviness      PLAN:  In order of problems listed above:  1. Thirty-day continuous monitor to rule out atrial fibrillation. She does several risk factors for atrial fibrillation including her age and family history. Both her father and mother what atrial fibrillation patients. 2. Clinical observation for this vague complaint of chest tightness that is pink continuous for 48 hours. As noted before she was ruled out for infarction with delta troponins. EKG did not reveal ischemia. She will have a 1 month follow-up to rediscuss the chest discomfort.    Medication Adjustments/Labs and Tests Ordered: Current medicines are reviewed at length with the patient today.  Concerns regarding medicines are outlined above.  Medication changes, Labs and Tests ordered today are listed in the Patient Instructions below. Patient Instructions  Medication Instructions:  Your physician recommends that you continue on your current medications as directed. Please refer to the Current Medication list given to you today.   Labwork: None ordered  Testing/Procedures: Your physician has recommended that you wear an event monitor. Event monitors are medical devices that record the heart's electrical activity. Doctors most often [place] these monitors to diagnose arrhythmias. Arrhythmias are problems with the speed or rhythm of the heartbeat. The monitor is  a small, portable device. You can wear one while you do your normal daily activities. This is usually used to diagnose what is causing palpitations/syncope (passing out).   Follow-Up: Your physician recommends that you schedule a follow-up appointment on 05/20/16 @ 12pm   Any Other Special Instructions Will Be Listed Below (If Applicable).     If you need a refill on your cardiac medications before your next appointment, please call your pharmacy.      Signed, Sinclair Grooms, MD  03/31/2016 4:04 PM    [facility] [place], [place], [place]  [address] Phone: [phone]; Fax: [phone]

## 2016-03-31 NOTE — Patient Instructions (Addendum)
Medication Instructions:  Your physician recommends that you continue on your current medications as directed. Please refer to the Current Medication list given to you today.   Labwork: None ordered  Testing/Procedures: Your physician has recommended that you wear an event monitor. Event monitors are medical devices that record the heart's electrical activity. Doctors most often [place] these monitors to diagnose arrhythmias. Arrhythmias are problems with the speed or rhythm of the heartbeat. The monitor is a small, portable device. You can wear one while you do your normal daily activities. This is usually used to diagnose what is causing palpitations/syncope (passing out).   Follow-Up: Your physician recommends that you schedule a follow-up appointment on 05/20/16 @ 12pm   Any Other Special Instructions Will Be Listed Below (If Applicable).     If you need a refill on your cardiac medications before your next appointment, please call your pharmacy.

## 2016-04-01 ENCOUNTER — Ambulatory Visit
Admission: RE | Admit: 2016-04-01 | Discharge: 2016-04-01 | Disposition: A | Discharge status: Home / Self Care | Payer: Medicare Other | Source: Ambulatory Visit | Attending: Family Medicine | Admitting: Family Medicine

## 2016-04-01 DIAGNOSIS — M810 Age-related osteoporosis without current pathological fracture: Secondary | ICD-10-CM

## 2016-04-08 ENCOUNTER — Ambulatory Visit (INDEPENDENT_AMBULATORY_CARE_PROVIDER_SITE_OTHER): Payer: Medicare Other

## 2016-04-08 DIAGNOSIS — R002 Palpitations: Secondary | ICD-10-CM

## 2016-05-19 DIAGNOSIS — I493 Ventricular premature depolarization: Secondary | ICD-10-CM | POA: Insufficient documentation

## 2016-05-20 ENCOUNTER — Encounter: Payer: Self-pay | Admitting: Interventional Cardiology

## 2016-05-20 ENCOUNTER — Ambulatory Visit (INDEPENDENT_AMBULATORY_CARE_PROVIDER_SITE_OTHER): Payer: Medicare Other | Admitting: Interventional Cardiology

## 2016-05-20 VITALS — BP 154/88 | HR 77 | Ht 60.0 in | Wt 151.0 lb

## 2016-05-20 DIAGNOSIS — R002 Palpitations: Secondary | ICD-10-CM | POA: Diagnosis not present

## 2016-05-20 DIAGNOSIS — I493 Ventricular premature depolarization: Secondary | ICD-10-CM

## 2016-05-20 NOTE — Progress Notes (Signed)
Cardiology Office Note    Date:  05/20/2016   ID:  Hannah Bates, DOB 1933/09/14, MRN [id number]  PCP:  Rachell Cipro, MD  Cardiologist: Sinclair Grooms, MD   Chief Complaint  Patient presents with  . Palpitations    History of Present Illness:  Hannah Bates is a 82 y.o. female follow-up follow-up of palpitations.  She is doing well. 30 day monitor did not demonstrate any arrhythmia. She has had no recurrence of palpitations. She denies chest discomfort, dyspnea, and other complaints.  Past Medical History:  Diagnosis Date  . Arthritis   . Osteoporosis     Past Surgical History:  Procedure Laterality Date  . CHOLECYSTECTOMY    . EYE SURGERY    . FINGER GANGLION CYST EXCISION     rt thumb    Current Medications: Outpatient Medications Prior to Visit  Medication Sig Dispense Refill  . Acetaminophen (TYLENOL PO) Take 1,000 mg by mouth daily as needed (pain/headache).     Marland Kitchen aspirin 81 MG tablet Take 81 mg by mouth daily.    Marland Kitchen atorvastatin (LIPITOR) 20 MG tablet Take 20 mg by mouth daily.    . Cholecalciferol (VITAMIN D3) 5000 units CAPS Take 5,000 Units by mouth daily.    . Loratadine (CLARITIN PO) Take 1 tablet by mouth daily as needed (allergies).     . Multiple Vitamins-Minerals (MULTIVITAMIN WITH MINERALS) tablet Take 1 tablet by mouth daily.    . ranitidine (ZANTAC) 150 MG tablet Take 150 mg by mouth 2 (two) times daily.     No facility-administered medications prior to visit.      Allergies:   Codeine; Sulfonamide derivatives; and Other   Social History   Social History  . Marital status: Married    Spouse name: Hannah Bates  . Number of children: Hannah Bates  . Years of education: Hannah Bates   Social History Main Topics  . Smoking status: Never Smoker  . Smokeless tobacco: Never Used  . Alcohol use No  . Drug use: No  . Sexual activity: Not Asked   Other Topics Concern  . None   Social History Narrative  . None     Family History:  The patient's  family history includes Cancer in her sister; Heart disease in her father and sister; Hyperlipidemia in her father, mother, and sister; Hypertension in her father and sister.   ROS:   Please see the history of present illness.    None  All other systems reviewed and are negative.   PHYSICAL EXAM:   VS:  BP (!) 154/88   Pulse 77   Ht 5' (1.524 m)   Wt 151 lb (68.5 kg)   BMI 29.49 kg/m    GEN: Well nourished, well developed, in no acute distress  HEENT: normal  Neck: no JVD, carotid bruits, or masses Cardiac: RRR; no murmurs, rubs, or gallops,no edema  Respiratory:  clear to auscultation bilaterally, normal work of breathing GI: soft, nontender, nondistended, + BS MS: no deformity or atrophy  Skin: warm and dry, no rash Neuro:  Alert and Oriented x 3, Strength and sensation are intact Psych: euthymic mood, full affect  Wt Readings from Last 3 Encounters:  05/20/16 151 lb (68.5 kg)  03/31/16 152 lb 3.2 oz (69 kg)  03/29/16 150 lb (68 kg)      Studies/Labs Reviewed:   EKG:  EKG  Not repeated.  Recent Labs: 03/29/2016: BUN 15; Creatinine, Ser 1.00; Hemoglobin 15.5; Platelets 273; Potassium 3.8; Sodium 141  Lipid Panel No results found for: CHOL, TRIG, HDL, CHOLHDL, VLDL, LDLCALC, LDLDIRECT  Additional studies/ records that were reviewed today include:  30 day event monitor performed October 2017: Study Highlights     Normal sinus rhythm   Normal study       ASSESSMENT:    1. PVC's (premature ventricular contractions)      PLAN:  In order of problems listed above:  1. The patient is asymptomatic. She had isolated PVCs during palpitations that were documented by the fire department. Subsequent monitoring did not reveal any significant arrhythmia and no PVCs. Currently we plan no further workup. We'll simply observe the patient. She will return if new symptoms. Foley will get additional information at this point would be a Loop recorder and I do not believe  her current status warrants that approach.    Medication Adjustments/Labs and Tests Ordered: Current medicines are reviewed at length with the patient today.  Concerns regarding medicines are outlined above.  Medication changes, Labs and Tests ordered today are listed in the Patient Instructions below. There are no Patient Instructions on file for this visit.   Signed, Sinclair Grooms, MD  05/20/2016 12:36 PM    [facility] [place], [place], [place]  [address] Phone: [phone]; Fax: [phone]

## 2016-05-20 NOTE — Patient Instructions (Signed)
Medication Instructions:  None  Labwork: None  Testing/Procedures: None  Follow-Up: Your physician recommends that you schedule a follow-up appointment as needed with Dr. Smith.    Any Other Special Instructions Will Be Listed Below (If Applicable).     If you need a refill on your cardiac medications before your next appointment, please call your pharmacy.   

## 2016-07-28 ENCOUNTER — Other Ambulatory Visit: Payer: Self-pay | Admitting: Family Medicine

## 2016-07-28 DIAGNOSIS — Z1231 Encounter for screening mammogram for malignant neoplasm of breast: Secondary | ICD-10-CM

## 2016-08-20 DIAGNOSIS — R110 Nausea: Secondary | ICD-10-CM | POA: Diagnosis not present

## 2016-08-20 DIAGNOSIS — R198 Other specified symptoms and signs involving the digestive system and abdomen: Secondary | ICD-10-CM | POA: Diagnosis not present

## 2016-08-20 DIAGNOSIS — Z86010 Personal history of colonic polyps: Secondary | ICD-10-CM | POA: Diagnosis not present

## 2016-08-27 ENCOUNTER — Ambulatory Visit
Admission: RE | Admit: 2016-08-27 | Discharge: 2016-08-27 | Disposition: A | Discharge status: Home / Self Care | Payer: Self-pay | Source: Ambulatory Visit | Attending: Family Medicine | Admitting: Family Medicine

## 2016-08-27 DIAGNOSIS — Z1231 Encounter for screening mammogram for malignant neoplasm of breast: Secondary | ICD-10-CM

## 2016-08-31 ENCOUNTER — Other Ambulatory Visit: Payer: Self-pay | Admitting: Family Medicine

## 2016-08-31 DIAGNOSIS — R928 Other abnormal and inconclusive findings on diagnostic imaging of breast: Secondary | ICD-10-CM

## 2016-09-03 ENCOUNTER — Ambulatory Visit
Admission: RE | Admit: 2016-09-03 | Discharge: 2016-09-03 | Disposition: A | Discharge status: Home / Self Care | Payer: PPO | Source: Ambulatory Visit | Attending: Family Medicine | Admitting: Family Medicine

## 2016-09-03 DIAGNOSIS — N6489 Other specified disorders of breast: Secondary | ICD-10-CM | POA: Diagnosis not present

## 2016-09-03 DIAGNOSIS — R928 Other abnormal and inconclusive findings on diagnostic imaging of breast: Secondary | ICD-10-CM | POA: Diagnosis not present

## 2016-09-17 DIAGNOSIS — Z86010 Personal history of colonic polyps: Secondary | ICD-10-CM | POA: Diagnosis not present

## 2016-10-14 DIAGNOSIS — S90562A Insect bite (nonvenomous), left ankle, initial encounter: Secondary | ICD-10-CM | POA: Diagnosis not present

## 2016-10-14 DIAGNOSIS — W57XXXA Bitten or stung by nonvenomous insect and other nonvenomous arthropods, initial encounter: Secondary | ICD-10-CM | POA: Diagnosis not present

## 2016-10-14 DIAGNOSIS — B999 Unspecified infectious disease: Secondary | ICD-10-CM | POA: Diagnosis not present

## 2016-10-14 DIAGNOSIS — Z6829 Body mass index (BMI) 29.0-29.9, adult: Secondary | ICD-10-CM | POA: Diagnosis not present

## 2016-11-30 DIAGNOSIS — Z79899 Other long term (current) drug therapy: Secondary | ICD-10-CM | POA: Diagnosis not present

## 2016-11-30 DIAGNOSIS — M810 Age-related osteoporosis without current pathological fracture: Secondary | ICD-10-CM | POA: Diagnosis not present

## 2016-11-30 DIAGNOSIS — E782 Mixed hyperlipidemia: Secondary | ICD-10-CM | POA: Diagnosis not present

## 2016-11-30 DIAGNOSIS — Z0000 Encounter for general adult medical examination without abnormal findings: Secondary | ICD-10-CM | POA: Diagnosis not present

## 2016-12-07 DIAGNOSIS — Z0000 Encounter for general adult medical examination without abnormal findings: Secondary | ICD-10-CM | POA: Diagnosis not present

## 2016-12-07 DIAGNOSIS — E559 Vitamin D deficiency, unspecified: Secondary | ICD-10-CM | POA: Diagnosis not present

## 2016-12-07 DIAGNOSIS — Z6829 Body mass index (BMI) 29.0-29.9, adult: Secondary | ICD-10-CM | POA: Diagnosis not present

## 2016-12-07 DIAGNOSIS — M810 Age-related osteoporosis without current pathological fracture: Secondary | ICD-10-CM | POA: Diagnosis not present

## 2016-12-07 DIAGNOSIS — E785 Hyperlipidemia, unspecified: Secondary | ICD-10-CM | POA: Diagnosis not present

## 2017-01-21 DIAGNOSIS — Z6829 Body mass index (BMI) 29.0-29.9, adult: Secondary | ICD-10-CM | POA: Diagnosis not present

## 2017-01-21 DIAGNOSIS — K5790 Diverticulosis of intestine, part unspecified, without perforation or abscess without bleeding: Secondary | ICD-10-CM | POA: Diagnosis not present

## 2017-01-21 DIAGNOSIS — R109 Unspecified abdominal pain: Secondary | ICD-10-CM | POA: Diagnosis not present

## 2017-01-21 DIAGNOSIS — K5792 Diverticulitis of intestine, part unspecified, without perforation or abscess without bleeding: Secondary | ICD-10-CM | POA: Diagnosis not present

## 2017-01-21 DIAGNOSIS — R030 Elevated blood-pressure reading, without diagnosis of hypertension: Secondary | ICD-10-CM | POA: Diagnosis not present

## 2017-01-28 DIAGNOSIS — L820 Inflamed seborrheic keratosis: Secondary | ICD-10-CM | POA: Diagnosis not present

## 2017-01-28 DIAGNOSIS — L814 Other melanin hyperpigmentation: Secondary | ICD-10-CM | POA: Diagnosis not present

## 2017-01-28 DIAGNOSIS — K5792 Diverticulitis of intestine, part unspecified, without perforation or abscess without bleeding: Secondary | ICD-10-CM | POA: Diagnosis not present

## 2017-01-28 DIAGNOSIS — R030 Elevated blood-pressure reading, without diagnosis of hypertension: Secondary | ICD-10-CM | POA: Diagnosis not present

## 2017-01-28 DIAGNOSIS — L728 Other follicular cysts of the skin and subcutaneous tissue: Secondary | ICD-10-CM | POA: Diagnosis not present

## 2017-01-28 DIAGNOSIS — D225 Melanocytic nevi of trunk: Secondary | ICD-10-CM | POA: Diagnosis not present

## 2017-01-28 DIAGNOSIS — L821 Other seborrheic keratosis: Secondary | ICD-10-CM | POA: Diagnosis not present

## 2017-02-04 ENCOUNTER — Other Ambulatory Visit: Payer: Self-pay | Admitting: Family Medicine

## 2017-02-04 DIAGNOSIS — N6310 Unspecified lump in the right breast, unspecified quadrant: Secondary | ICD-10-CM

## 2017-03-08 ENCOUNTER — Ambulatory Visit
Admission: RE | Admit: 2017-03-08 | Discharge: 2017-03-08 | Disposition: A | Discharge status: Home / Self Care | Payer: PPO | Source: Ambulatory Visit | Attending: Family Medicine | Admitting: Family Medicine

## 2017-03-08 ENCOUNTER — Other Ambulatory Visit: Payer: Self-pay | Admitting: Family Medicine

## 2017-03-08 DIAGNOSIS — N6310 Unspecified lump in the right breast, unspecified quadrant: Secondary | ICD-10-CM

## 2017-03-08 DIAGNOSIS — R928 Other abnormal and inconclusive findings on diagnostic imaging of breast: Secondary | ICD-10-CM | POA: Diagnosis not present

## 2017-03-11 DIAGNOSIS — R21 Rash and other nonspecific skin eruption: Secondary | ICD-10-CM | POA: Diagnosis not present

## 2017-03-31 DIAGNOSIS — Z23 Encounter for immunization: Secondary | ICD-10-CM | POA: Diagnosis not present

## 2017-04-06 DIAGNOSIS — R21 Rash and other nonspecific skin eruption: Secondary | ICD-10-CM | POA: Diagnosis not present

## 2017-06-15 DIAGNOSIS — E559 Vitamin D deficiency, unspecified: Secondary | ICD-10-CM | POA: Diagnosis not present

## 2017-06-15 DIAGNOSIS — M810 Age-related osteoporosis without current pathological fracture: Secondary | ICD-10-CM | POA: Diagnosis not present

## 2017-06-15 DIAGNOSIS — R030 Elevated blood-pressure reading, without diagnosis of hypertension: Secondary | ICD-10-CM | POA: Diagnosis not present

## 2017-06-15 DIAGNOSIS — E782 Mixed hyperlipidemia: Secondary | ICD-10-CM | POA: Diagnosis not present

## 2017-06-16 DIAGNOSIS — E559 Vitamin D deficiency, unspecified: Secondary | ICD-10-CM | POA: Diagnosis not present

## 2017-06-16 DIAGNOSIS — M5431 Sciatica, right side: Secondary | ICD-10-CM | POA: Diagnosis not present

## 2017-06-16 DIAGNOSIS — M810 Age-related osteoporosis without current pathological fracture: Secondary | ICD-10-CM | POA: Diagnosis not present

## 2017-06-16 DIAGNOSIS — E782 Mixed hyperlipidemia: Secondary | ICD-10-CM | POA: Diagnosis not present

## 2017-08-03 DIAGNOSIS — I10 Essential (primary) hypertension: Secondary | ICD-10-CM | POA: Diagnosis not present

## 2017-08-03 DIAGNOSIS — Z6828 Body mass index (BMI) 28.0-28.9, adult: Secondary | ICD-10-CM | POA: Diagnosis not present

## 2017-08-15 DIAGNOSIS — Z6828 Body mass index (BMI) 28.0-28.9, adult: Secondary | ICD-10-CM | POA: Diagnosis not present

## 2017-08-15 DIAGNOSIS — J069 Acute upper respiratory infection, unspecified: Secondary | ICD-10-CM | POA: Diagnosis not present

## 2017-09-02 ENCOUNTER — Other Ambulatory Visit: Payer: PPO

## 2017-09-06 ENCOUNTER — Other Ambulatory Visit: Payer: PPO

## 2017-09-07 ENCOUNTER — Other Ambulatory Visit: Payer: Self-pay | Admitting: Family Medicine

## 2017-09-07 ENCOUNTER — Ambulatory Visit
Admission: RE | Admit: 2017-09-07 | Discharge: 2017-09-07 | Disposition: A | Discharge status: Home / Self Care | Payer: PPO | Source: Ambulatory Visit | Attending: Family Medicine | Admitting: Family Medicine

## 2017-09-07 DIAGNOSIS — N6310 Unspecified lump in the right breast, unspecified quadrant: Secondary | ICD-10-CM

## 2017-09-07 DIAGNOSIS — N6011 Diffuse cystic mastopathy of right breast: Secondary | ICD-10-CM | POA: Diagnosis not present

## 2017-09-07 DIAGNOSIS — R928 Other abnormal and inconclusive findings on diagnostic imaging of breast: Secondary | ICD-10-CM | POA: Diagnosis not present

## 2017-09-08 ENCOUNTER — Ambulatory Visit
Admission: RE | Admit: 2017-09-08 | Discharge: 2017-09-08 | Disposition: A | Discharge status: Home / Self Care | Payer: PPO | Source: Ambulatory Visit | Attending: Family Medicine | Admitting: Family Medicine

## 2017-09-08 DIAGNOSIS — N6313 Unspecified lump in the right breast, lower outer quadrant: Secondary | ICD-10-CM | POA: Diagnosis not present

## 2017-09-08 DIAGNOSIS — N6011 Diffuse cystic mastopathy of right breast: Secondary | ICD-10-CM | POA: Diagnosis not present

## 2017-09-08 DIAGNOSIS — N6310 Unspecified lump in the right breast, unspecified quadrant: Secondary | ICD-10-CM

## 2017-09-08 DIAGNOSIS — N6311 Unspecified lump in the right breast, upper outer quadrant: Secondary | ICD-10-CM | POA: Diagnosis not present

## 2017-10-07 DIAGNOSIS — H903 Sensorineural hearing loss, bilateral: Secondary | ICD-10-CM | POA: Diagnosis not present

## 2017-10-12 DIAGNOSIS — H903 Sensorineural hearing loss, bilateral: Secondary | ICD-10-CM | POA: Diagnosis not present

## 2017-11-08 DIAGNOSIS — H26491 Other secondary cataract, right eye: Secondary | ICD-10-CM | POA: Diagnosis not present

## 2017-11-08 DIAGNOSIS — H04123 Dry eye syndrome of bilateral lacrimal glands: Secondary | ICD-10-CM | POA: Diagnosis not present

## 2017-11-08 DIAGNOSIS — D3131 Benign neoplasm of right choroid: Secondary | ICD-10-CM | POA: Diagnosis not present

## 2017-11-08 DIAGNOSIS — H4321 Crystalline deposits in vitreous body, right eye: Secondary | ICD-10-CM | POA: Diagnosis not present

## 2017-11-08 DIAGNOSIS — Z961 Presence of intraocular lens: Secondary | ICD-10-CM | POA: Diagnosis not present

## 2017-11-08 DIAGNOSIS — H10413 Chronic giant papillary conjunctivitis, bilateral: Secondary | ICD-10-CM | POA: Diagnosis not present

## 2017-12-09 DIAGNOSIS — I10 Essential (primary) hypertension: Secondary | ICD-10-CM | POA: Diagnosis not present

## 2017-12-09 DIAGNOSIS — E559 Vitamin D deficiency, unspecified: Secondary | ICD-10-CM | POA: Diagnosis not present

## 2017-12-09 DIAGNOSIS — E782 Mixed hyperlipidemia: Secondary | ICD-10-CM | POA: Diagnosis not present

## 2017-12-12 DIAGNOSIS — I10 Essential (primary) hypertension: Secondary | ICD-10-CM | POA: Diagnosis not present

## 2017-12-12 DIAGNOSIS — E785 Hyperlipidemia, unspecified: Secondary | ICD-10-CM | POA: Diagnosis not present

## 2017-12-12 DIAGNOSIS — E559 Vitamin D deficiency, unspecified: Secondary | ICD-10-CM | POA: Diagnosis not present

## 2017-12-12 DIAGNOSIS — Z6829 Body mass index (BMI) 29.0-29.9, adult: Secondary | ICD-10-CM | POA: Diagnosis not present

## 2017-12-20 DIAGNOSIS — Z0000 Encounter for general adult medical examination without abnormal findings: Secondary | ICD-10-CM | POA: Diagnosis not present

## 2017-12-20 DIAGNOSIS — Z6830 Body mass index (BMI) 30.0-30.9, adult: Secondary | ICD-10-CM | POA: Diagnosis not present

## 2017-12-20 DIAGNOSIS — Z23 Encounter for immunization: Secondary | ICD-10-CM | POA: Diagnosis not present

## 2017-12-20 DIAGNOSIS — Z1159 Encounter for screening for other viral diseases: Secondary | ICD-10-CM | POA: Diagnosis not present

## 2018-01-30 DIAGNOSIS — L821 Other seborrheic keratosis: Secondary | ICD-10-CM | POA: Diagnosis not present

## 2018-01-30 DIAGNOSIS — L603 Nail dystrophy: Secondary | ICD-10-CM | POA: Diagnosis not present

## 2018-02-21 DIAGNOSIS — Z23 Encounter for immunization: Secondary | ICD-10-CM | POA: Diagnosis not present

## 2018-06-13 DIAGNOSIS — E782 Mixed hyperlipidemia: Secondary | ICD-10-CM | POA: Diagnosis not present

## 2018-06-13 DIAGNOSIS — R030 Elevated blood-pressure reading, without diagnosis of hypertension: Secondary | ICD-10-CM | POA: Diagnosis not present

## 2018-06-13 DIAGNOSIS — E559 Vitamin D deficiency, unspecified: Secondary | ICD-10-CM | POA: Diagnosis not present

## 2018-06-15 DIAGNOSIS — I10 Essential (primary) hypertension: Secondary | ICD-10-CM | POA: Diagnosis not present

## 2018-06-15 DIAGNOSIS — Z6830 Body mass index (BMI) 30.0-30.9, adult: Secondary | ICD-10-CM | POA: Diagnosis not present

## 2018-06-15 DIAGNOSIS — E785 Hyperlipidemia, unspecified: Secondary | ICD-10-CM | POA: Diagnosis not present

## 2018-06-15 DIAGNOSIS — M810 Age-related osteoporosis without current pathological fracture: Secondary | ICD-10-CM | POA: Diagnosis not present

## 2018-06-15 DIAGNOSIS — E559 Vitamin D deficiency, unspecified: Secondary | ICD-10-CM | POA: Diagnosis not present

## 2018-06-16 ENCOUNTER — Other Ambulatory Visit: Payer: Self-pay | Admitting: Family Medicine

## 2018-06-16 DIAGNOSIS — E2839 Other primary ovarian failure: Secondary | ICD-10-CM

## 2018-08-10 ENCOUNTER — Encounter: Payer: Self-pay | Admitting: Emergency Medicine

## 2018-08-10 ENCOUNTER — Ambulatory Visit: Payer: PPO | Admitting: Emergency Medicine

## 2018-08-10 ENCOUNTER — Ambulatory Visit (INDEPENDENT_AMBULATORY_CARE_PROVIDER_SITE_OTHER)
Admission: RE | Admit: 2018-08-10 | Discharge: 2018-08-10 | Disposition: A | Discharge status: Home / Self Care | Payer: PPO | Source: Ambulatory Visit | Attending: Emergency Medicine | Admitting: Emergency Medicine

## 2018-08-10 VITALS — BP 134/70 | HR 68 | Ht 60.0 in | Wt 150.8 lb

## 2018-08-10 DIAGNOSIS — Z77090 Contact with and (suspected) exposure to asbestos: Secondary | ICD-10-CM

## 2018-08-10 IMAGING — DX DG CHEST 2V
2 series · 2 of 2 positions shown · non-contrast
Comparison: [DATE]

CLINICAL DATA: Asbestos exposure

EXAM:
CHEST - 2 VIEW

[chest pa]
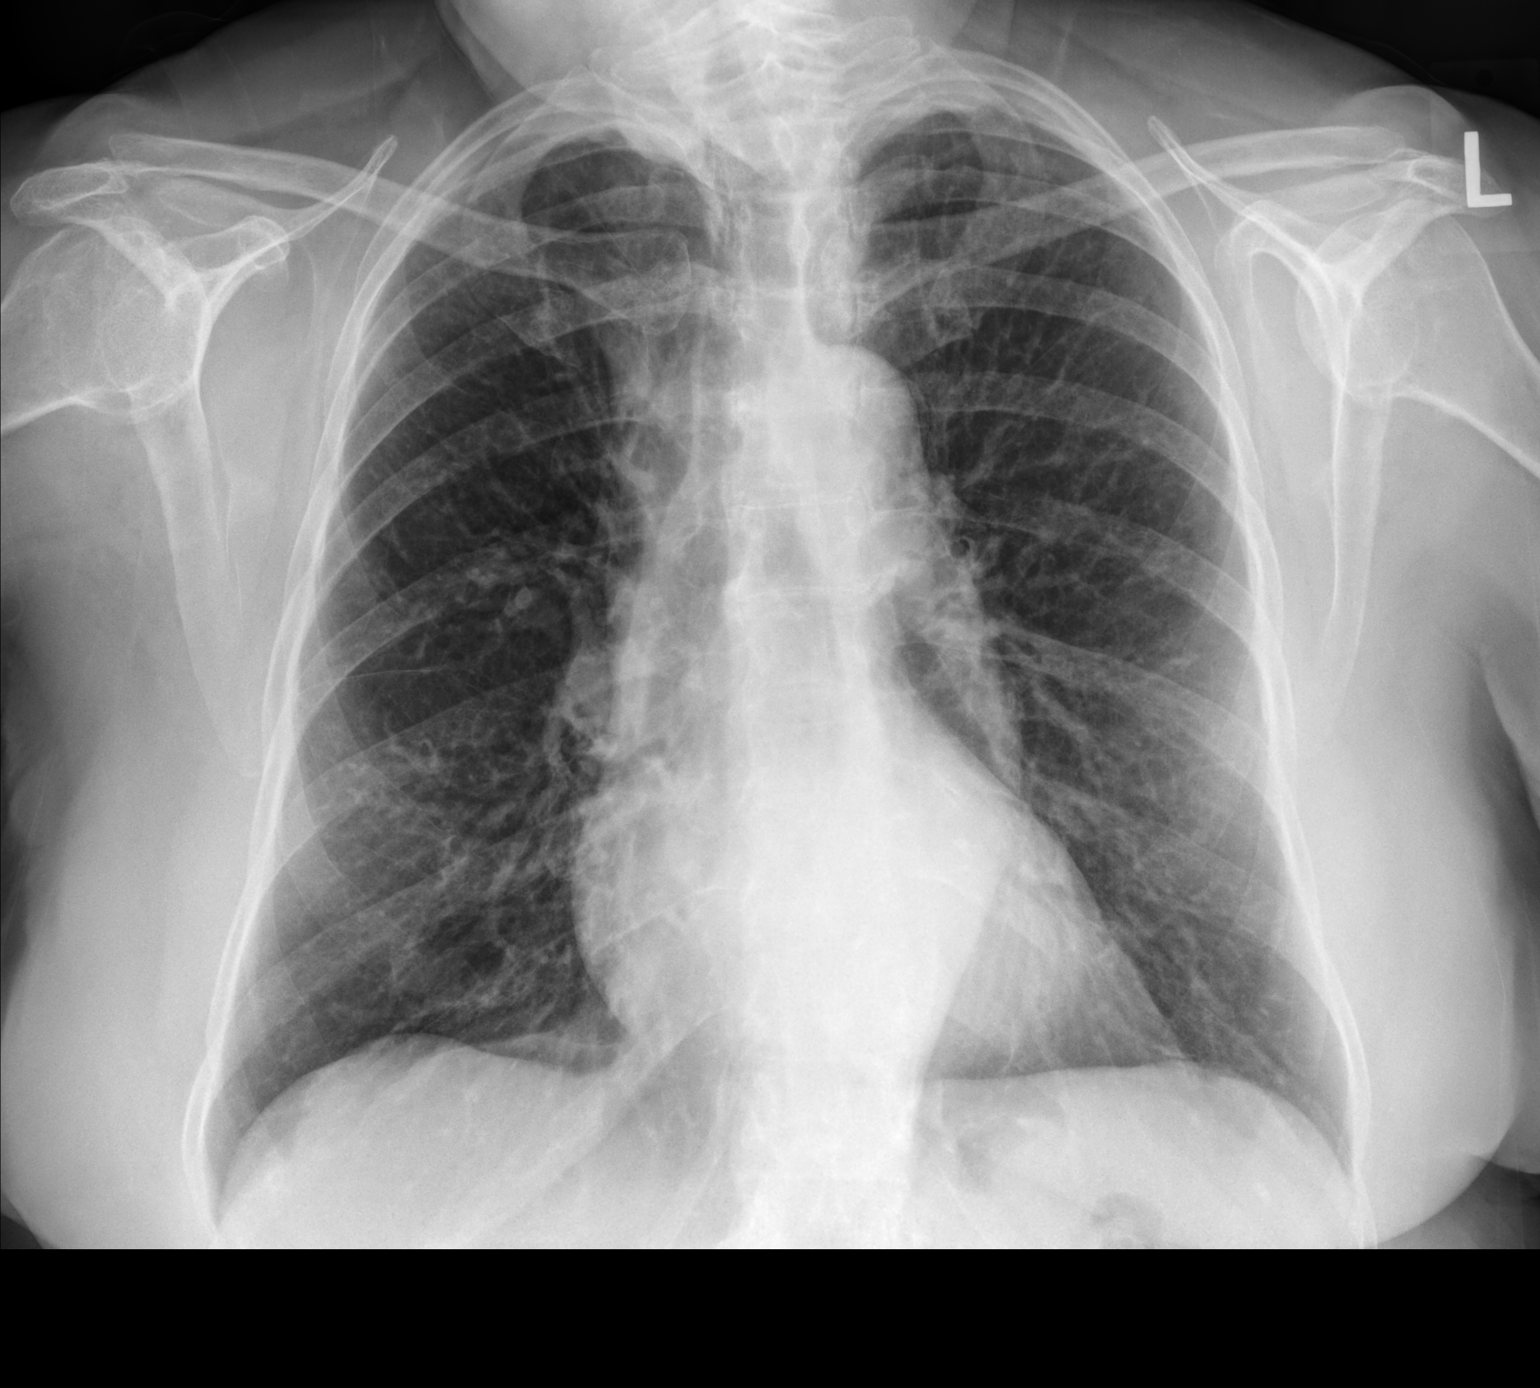

[chest lat]
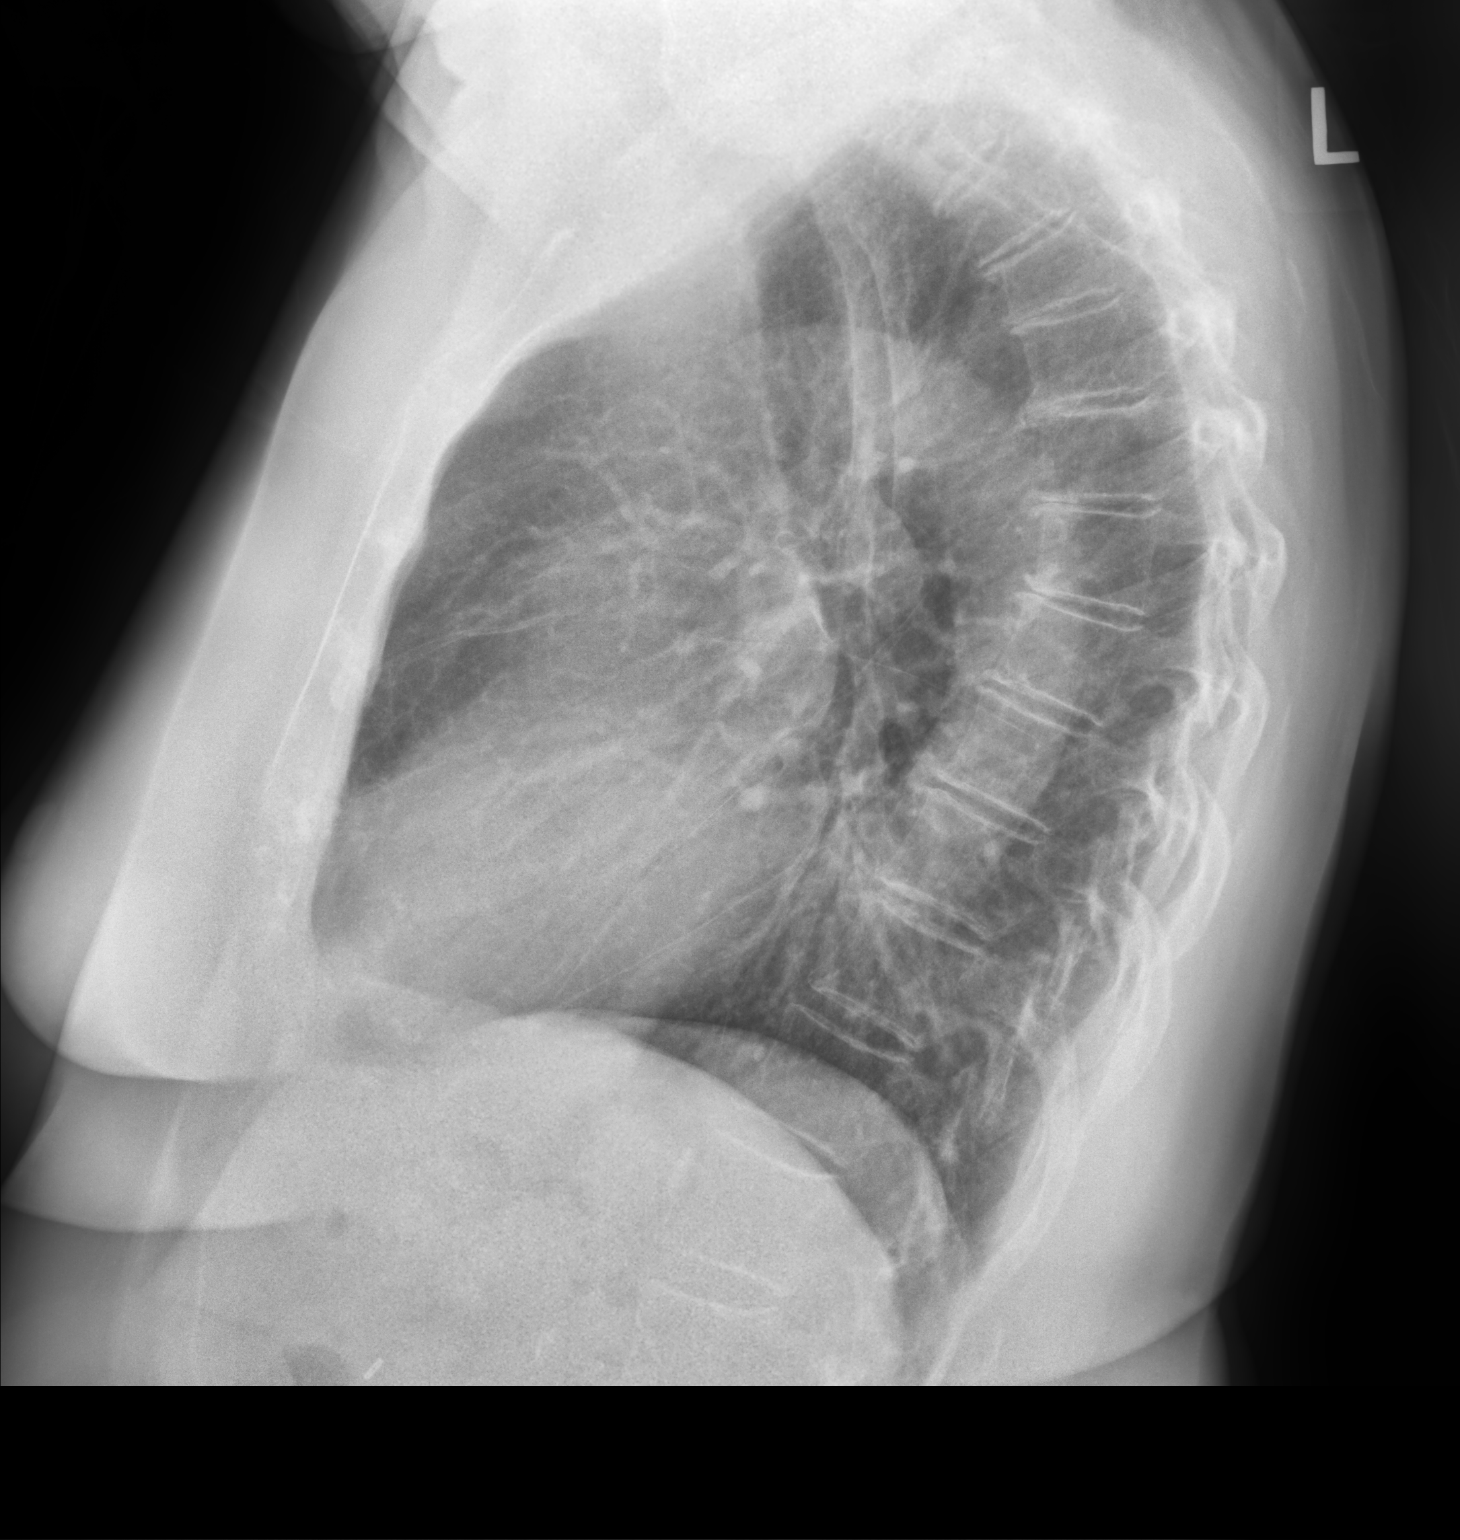

[2 of 2 positions shown; findings below may reference images not displayed]

FINDINGS: Normal heart size. Lungs are hyperaerated and clear. No
pneumothorax. No pleural effusion. Mild upper thoracic compression
deformities of indeterminate age are noted.
IMPRESSION: No active cardiopulmonary disease.

## 2018-08-10 NOTE — Progress Notes (Signed)
Subjective:    Patient ID: Hannah Bates, female    DOB: Jul 07, 1933, 85 y.o.   MRN: [id number]  HPI 85 year old never smoker with a history of osteoporosis, arthritis, PVCs, hyperlipidemia, allergic rhinitis, GERD.  She is a self-referral today to follow and monitor for an asbestos exposure. Her husband worked with asbestos and she was exposed to his clothing, to dust and affected material. 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 She has never had an abnormal CXR to her knowledge. No lung problems in the past. She has some cough, happens infrequently, but she can wake herself up at night. Her reflux was controlled on omeprazole, now on pepcid. No CP, no hemoptysis.   Review of Systems  Constitutional: Negative for activity change, fatigue and fever.  HENT: Negative for congestion, postnasal drip and sore throat.   Respiratory: Negative for cough, chest tightness and shortness of breath.   Cardiovascular: Negative for chest pain.  Gastrointestinal: Negative for abdominal pain.  Musculoskeletal: Negative for arthralgias, joint swelling and myalgias.  Neurological: Negative for dizziness and seizures.   Past Medical History:  Diagnosis Date  . Arthritis   . Osteoporosis      Family History  Problem Relation Age of Onset  . Hyperlipidemia Mother   . Heart disease Father   . Hyperlipidemia Father   . Hypertension Father   . Lung disease Father        black lung- miner   . Cancer Sister   . Heart disease Sister   . Hyperlipidemia Sister   . Hypertension Sister   . Breast cancer Other      Social History   Socioeconomic History  . Marital status: Married    Spouse name: Not on file  . Number of children: Not on file  . Years of education: Not on file  . Highest education level: Not on file  Occupational History  . Not on file  Social Needs  . Financial resource strain: Not on file  . Food insecurity:    Worry: Not on  file    Inability: Not on file  . Transportation needs:    Medical: Not on file    Non-medical: Not on file  Tobacco Use  . Smoking status: Never Smoker  . Smokeless tobacco: Never Used  Substance and Sexual Activity  . Alcohol use: No    Alcohol/week: 0.0 standard drinks  . Drug use: No  . Sexual activity: Not on file  Lifestyle  . Physical activity:    Days per week: Not on file    Minutes per session: Not on file  . Stress: Not on file  Relationships  . Social connections:    Talks on phone: Not on file    Gets together: Not on file    Attends religious service: Not on file    Active member of club or organization: Not on file    Attends meetings of clubs or organizations: Not on file    Relationship status: Not on file  . Intimate partner violence:    Fear of current or ex partner: Not on file    Emotionally abused: Not on file    Physically abused: Not on file    Forced sexual activity: Not on file  Other Topics Concern  . Not on file  Social History Narrative  . Not on file    Allergies  Allergen Reactions  . Codeine  REACTION: Rash  . Sulfonamide Derivatives     REACTION: Rash  . Other Rash    Event monitor leads need....sensitive     Outpatient Medications Prior to Visit  Medication Sig Dispense Refill  . Acetaminophen (TYLENOL PO) Take 1,000 mg by mouth daily as needed (pain/headache).     Marland Kitchen aspirin 81 MG tablet Take 81 mg by mouth daily.    Marland Kitchen atorvastatin (LIPITOR) 20 MG tablet Take 20 mg by mouth daily.    . Biotin 10000 MCG TABS Take 1 tablet by mouth daily.    . calcium citrate (CALCITRATE - DOSED IN MG ELEMENTAL CALCIUM) 950 MG tablet Take 200 mg of elemental calcium by mouth daily.    . Cholecalciferol (VITAMIN D3) 5000 units CAPS Take 5,000 Units by mouth daily.    . famotidine (PEPCID) 20 MG tablet Take 20 mg by mouth daily.    . Loratadine (CLARITIN PO) Take 1 tablet by mouth daily.     . Multiple Vitamins-Minerals (MULTIVITAMIN WITH  MINERALS) tablet Take 1 tablet by mouth daily.    . Omega-3 Fatty Acids (SUPER OMEGA-3) 1000 MG CAPS Take 1 capsule by mouth daily.    Marland Kitchen alendronate (FOSAMAX) 70 MG tablet Take 70 mg by mouth once a week. Take with a full glass of water on an empty stomach.    . ranitidine (ZANTAC) 150 MG tablet Take 150 mg by mouth 2 (two) times daily.     No facility-administered medications prior to visit.         Objective:   Physical Exam Vitals:   08/10/18 0924  BP: 134/70  Pulse: 68  SpO2: 96%  Weight: 150 lb 12.8 oz (68.4 kg)  Height: 5' (1.524 m)  Gen: Pleasant, well-nourished, in no distress,  normal affect  ENT: No lesions,  mouth clear,  oropharynx clear, no postnasal drip  Neck: No JVD, no stridor  Lungs: No use of accessory muscles, no crackles or wheezing on normal respiration, no wheeze on forced expiration  Cardiovascular: RRR, heart sounds normal, no murmur or gallops, no peripheral edema  Musculoskeletal: No deformities, no cyanosis or clubbing  Neuro: alert, awake, non focal  Skin: Warm, no lesions or rash      Assessment & Plan:  Asbestos exposure Without any evidence for chest x-ray abnormality or pleural plaques.  I do believe she needs to be screened annually.  If her chest x-ray has any suspicious lesions or if she changes clinically then we may decide to do a screening CT scan to better evaluate.  We will perform a chest x-ray today and decide the frequency of repeats depending on the results.  Baltazar Apo, MD, PhD 08/10/2018, 9:46 AM [facility] [phone] or if no answer [phone]

## 2018-08-10 NOTE — Patient Instructions (Signed)
We will perform a chest x-ray today to evaluate for possible asbestos exposure.  You will likely need to have a screening chest x-ray annually Follow with Dr. Lamonte Sakai in 12 months or sooner if you have any problems.

## 2018-08-10 NOTE — Assessment & Plan Note (Signed)
Without any evidence for chest x-ray abnormality or pleural plaques.  I do believe she needs to be screened annually.  If her chest x-ray has any suspicious lesions or if she changes clinically then we may decide to do a screening CT scan to better evaluate.  We will perform a chest x-ray today and decide the frequency of repeats depending on the results.

## 2018-08-11 ENCOUNTER — Telehealth: Payer: Self-pay | Admitting: Emergency Medicine

## 2018-08-11 NOTE — Telephone Encounter (Signed)
Called and spoke with pt. Pt is requesting to know results of cxr that was performed 08/10/2018. Dr. Lamonte Sakai, please advise. Thanks!

## 2018-08-11 NOTE — Telephone Encounter (Signed)
Called and spoke with Patient.  Dr Agustina Caroli CXR results and recommendations given.  Understanding stated.  Nothing further at this time.

## 2018-08-11 NOTE — Telephone Encounter (Signed)
Please let her know that I reviewed her CXR. There is no evidence of asbestos exposure or asbestos-related disease. This is good news.

## 2018-08-15 ENCOUNTER — Other Ambulatory Visit: Payer: Self-pay | Admitting: Family Medicine

## 2018-08-15 DIAGNOSIS — Z1231 Encounter for screening mammogram for malignant neoplasm of breast: Secondary | ICD-10-CM

## 2018-08-22 ENCOUNTER — Other Ambulatory Visit: Payer: PPO

## 2018-09-12 ENCOUNTER — Ambulatory Visit
Admission: RE | Admit: 2018-09-12 | Discharge: 2018-09-12 | Disposition: A | Discharge status: Home / Self Care | Payer: PPO | Source: Ambulatory Visit | Attending: Family Medicine | Admitting: Family Medicine

## 2018-09-12 DIAGNOSIS — Z1231 Encounter for screening mammogram for malignant neoplasm of breast: Secondary | ICD-10-CM

## 2018-09-12 DIAGNOSIS — Z780 Asymptomatic menopausal state: Secondary | ICD-10-CM | POA: Diagnosis not present

## 2018-09-12 DIAGNOSIS — M810 Age-related osteoporosis without current pathological fracture: Secondary | ICD-10-CM | POA: Diagnosis not present

## 2018-09-12 DIAGNOSIS — E2839 Other primary ovarian failure: Secondary | ICD-10-CM

## 2018-09-12 IMAGING — MG DIGITAL SCREENING BILATERAL MAMMOGRAM WITH TOMO AND CAD
8 series · 8 of 24 positions shown · non-contrast
Comparison: Previous exam(s).

CLINICAL DATA: Screening.

EXAM:
DIGITAL SCREENING BILATERAL MAMMOGRAM WITH TOMO AND CAD

[R MLO synth-2D]
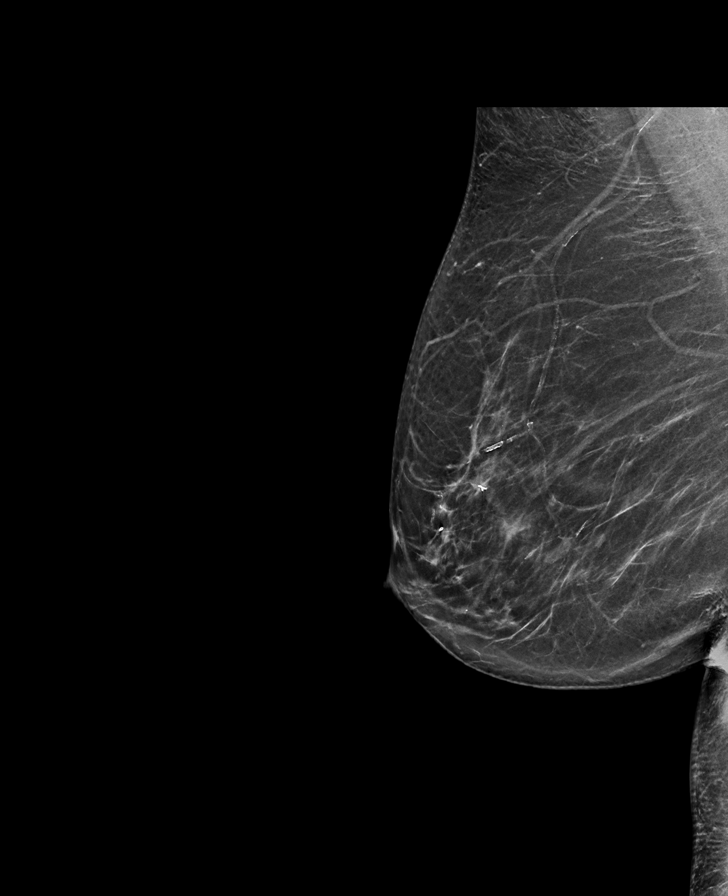

[L MLO synth-2D]
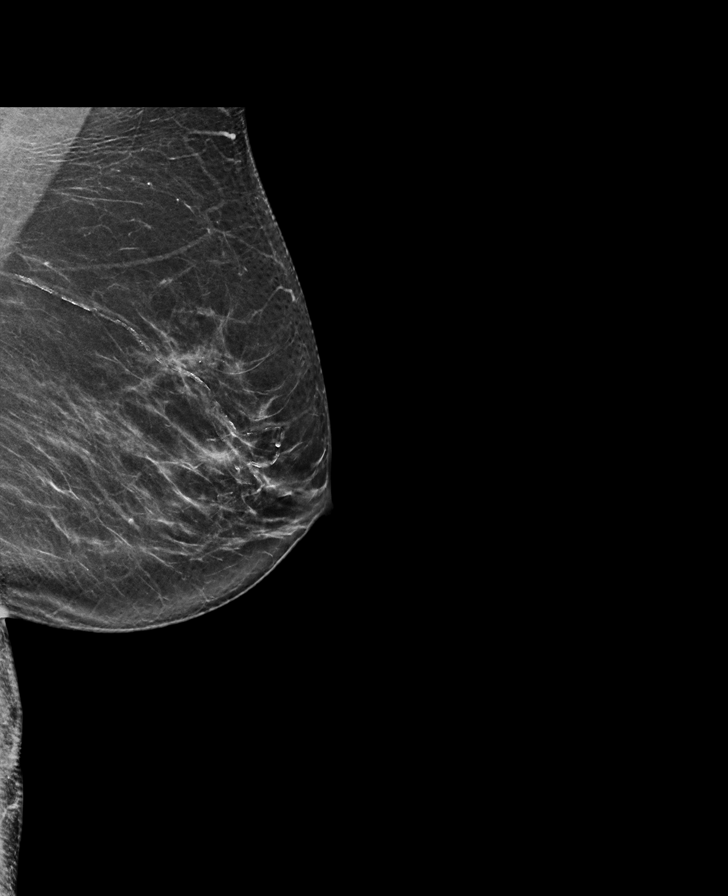

[L CC synth-2D]
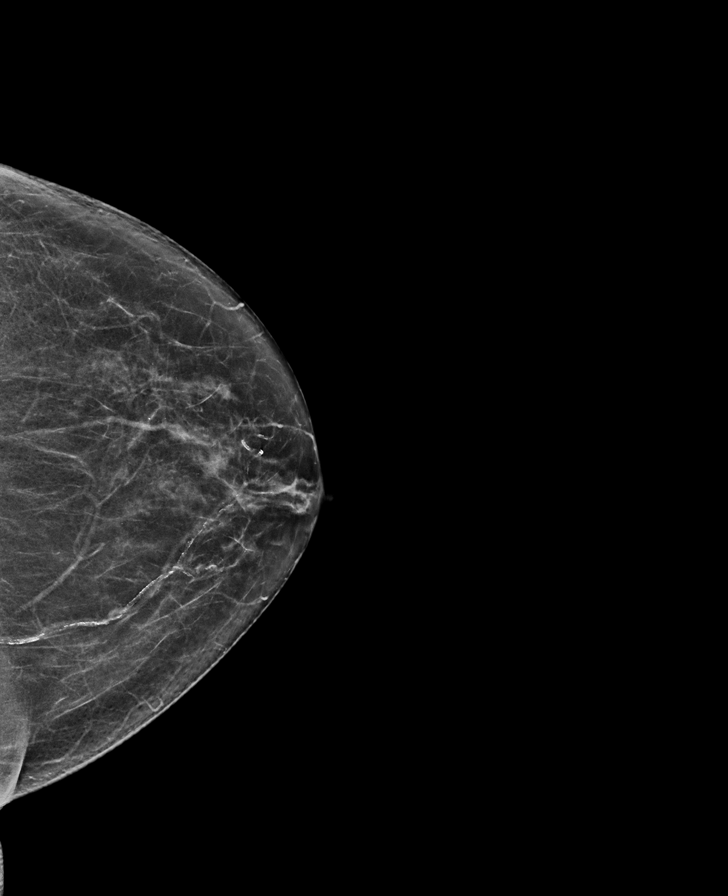

[R CC synth-2D]
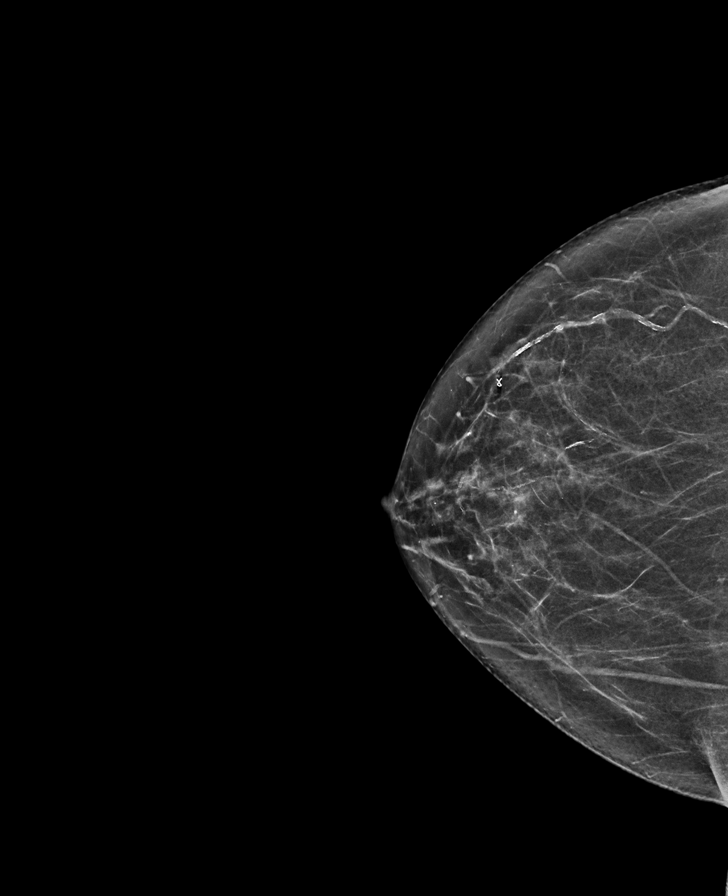

[L MLO tomo · tomo slice 35/69.0]
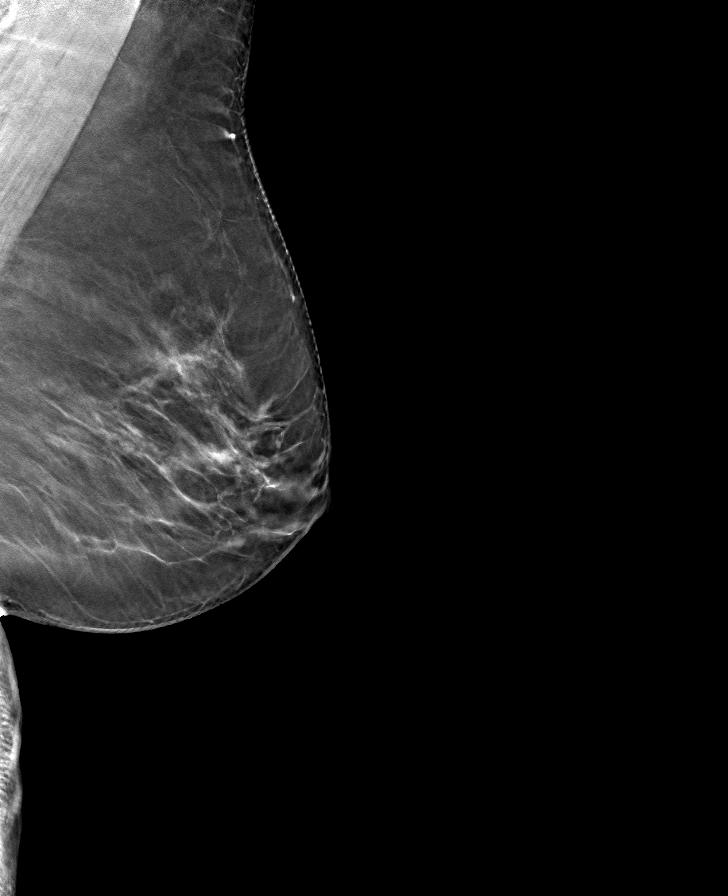

[L CC tomo · tomo slice 32/63.0]
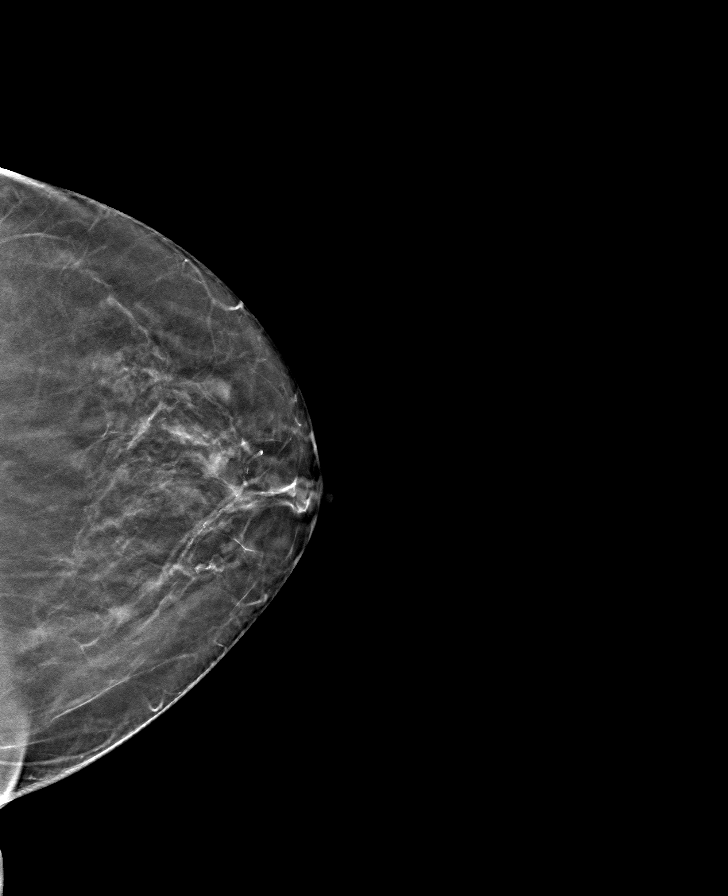

[R CC tomo · tomo slice 31/62.0]
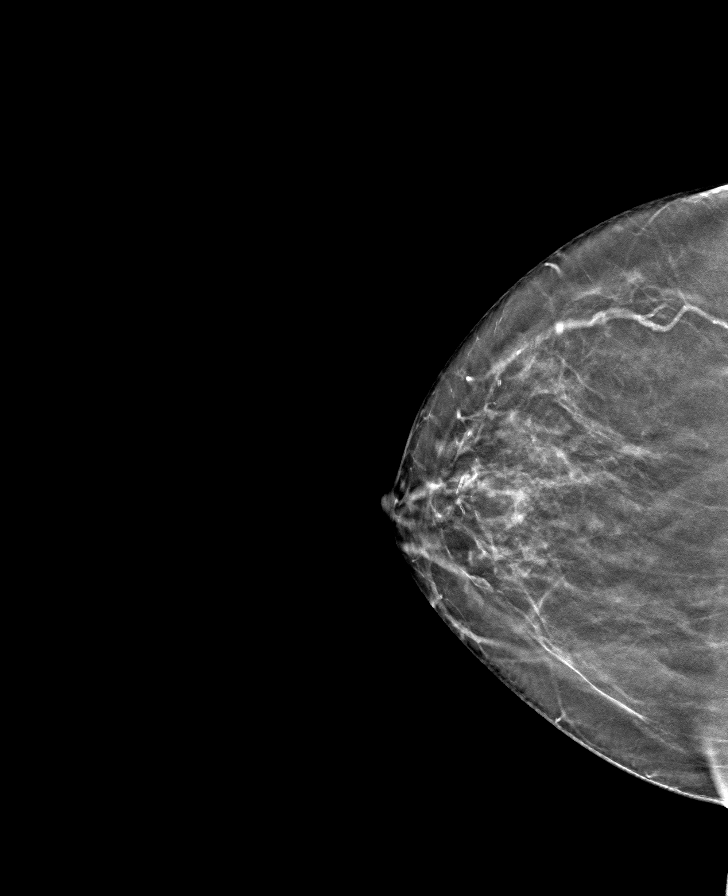

[R MLO tomo · tomo slice 36/71.0]
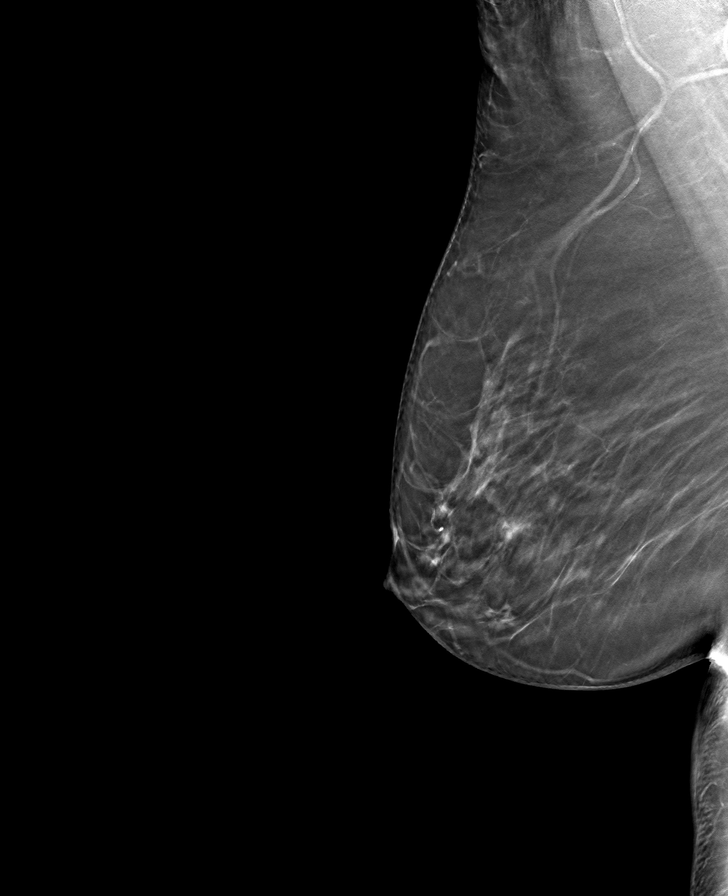

[8 of 24 positions shown; findings below may reference images not displayed]

ACR Breast Density Category b: There are scattered areas of
fibroglandular density.
FINDINGS: There are no findings suspicious for malignancy. Images were
processed with CAD.
IMPRESSION: No mammographic evidence of malignancy. A result letter of this
screening mammogram will be mailed directly to the patient.

RECOMMENDATION:
Screening mammogram in one year. (Code:[TQ])

BI-RADS CATEGORY  1: Negative.

## 2018-09-15 DIAGNOSIS — E559 Vitamin D deficiency, unspecified: Secondary | ICD-10-CM | POA: Diagnosis not present

## 2018-09-21 DIAGNOSIS — N6011 Diffuse cystic mastopathy of right breast: Secondary | ICD-10-CM | POA: Diagnosis not present

## 2018-09-21 DIAGNOSIS — Z719 Counseling, unspecified: Secondary | ICD-10-CM | POA: Diagnosis not present

## 2018-09-21 DIAGNOSIS — E559 Vitamin D deficiency, unspecified: Secondary | ICD-10-CM | POA: Diagnosis not present

## 2018-09-21 DIAGNOSIS — M810 Age-related osteoporosis without current pathological fracture: Secondary | ICD-10-CM | POA: Diagnosis not present

## 2018-09-22 DIAGNOSIS — D2339 Other benign neoplasm of skin of other parts of face: Secondary | ICD-10-CM | POA: Diagnosis not present

## 2018-09-22 DIAGNOSIS — L989 Disorder of the skin and subcutaneous tissue, unspecified: Secondary | ICD-10-CM | POA: Diagnosis not present

## 2018-12-26 DIAGNOSIS — Z0000 Encounter for general adult medical examination without abnormal findings: Secondary | ICD-10-CM | POA: Diagnosis not present

## 2019-01-31 ENCOUNTER — Other Ambulatory Visit: Payer: Self-pay

## 2019-04-02 DIAGNOSIS — K219 Gastro-esophageal reflux disease without esophagitis: Secondary | ICD-10-CM | POA: Diagnosis not present

## 2019-04-02 DIAGNOSIS — Z23 Encounter for immunization: Secondary | ICD-10-CM | POA: Diagnosis not present

## 2019-04-02 DIAGNOSIS — N183 Chronic kidney disease, stage 3 (moderate): Secondary | ICD-10-CM | POA: Diagnosis not present

## 2019-04-02 DIAGNOSIS — R030 Elevated blood-pressure reading, without diagnosis of hypertension: Secondary | ICD-10-CM | POA: Diagnosis not present

## 2019-04-02 DIAGNOSIS — K432 Incisional hernia without obstruction or gangrene: Secondary | ICD-10-CM | POA: Diagnosis not present

## 2019-05-02 DIAGNOSIS — J029 Acute pharyngitis, unspecified: Secondary | ICD-10-CM | POA: Diagnosis not present

## 2019-05-04 DIAGNOSIS — R6889 Other general symptoms and signs: Secondary | ICD-10-CM | POA: Diagnosis not present

## 2019-07-12 DIAGNOSIS — D1801 Hemangioma of skin and subcutaneous tissue: Secondary | ICD-10-CM | POA: Diagnosis not present

## 2019-07-12 DIAGNOSIS — D225 Melanocytic nevi of trunk: Secondary | ICD-10-CM | POA: Diagnosis not present

## 2019-07-12 DIAGNOSIS — L821 Other seborrheic keratosis: Secondary | ICD-10-CM | POA: Diagnosis not present

## 2019-07-12 DIAGNOSIS — L814 Other melanin hyperpigmentation: Secondary | ICD-10-CM | POA: Diagnosis not present

## 2019-07-24 DIAGNOSIS — Z03818 Encounter for observation for suspected exposure to other biological agents ruled out: Secondary | ICD-10-CM | POA: Diagnosis not present

## 2019-07-24 DIAGNOSIS — Z20828 Contact with and (suspected) exposure to other viral communicable diseases: Secondary | ICD-10-CM | POA: Diagnosis not present

## 2019-07-24 DIAGNOSIS — Z20822 Contact with and (suspected) exposure to covid-19: Secondary | ICD-10-CM | POA: Diagnosis not present

## 2019-07-25 DIAGNOSIS — J3489 Other specified disorders of nose and nasal sinuses: Secondary | ICD-10-CM | POA: Diagnosis not present

## 2019-08-10 ENCOUNTER — Telehealth: Payer: Self-pay | Admitting: Emergency Medicine

## 2019-08-10 NOTE — Telephone Encounter (Signed)
Called spoke with patient who reports having bronchitis symptoms x2 weeks and she wanted to know if she needed to have a covid test before coming in for the appt with Dr Lamonte Sakai on 2/9.  Per patient she is having prod cough with 'gobs' of green/yellow mucus x2 weeks, sinus pressure with clear mucus, PND esp when lying down at night.  Patient denies any known sick contacts, f/c/s, hemoptysis, increased SHOB, wheezing, tightness in chest.  Patient has not sought treatment for her symptoms elsewhere.  Did inform patient that we are encouraging patients with any symptoms to change their visits to virtual.  Did offer patient earlier appt with APP to address her symptoms now but patient declined, stating that she was okay to wait for her appt with Dr Lamonte Sakai on 2/9.  Patient's spouse is also scheduled for appt same - spouse is not exhibiting any symptoms but will change his appt to telephone visit because he has been in close living quarters with patient and for convenience.  Both appts changed to televisits. Will sign off.

## 2019-08-13 ENCOUNTER — Ambulatory Visit: Payer: PPO | Admitting: Primary Care

## 2019-08-13 ENCOUNTER — Ambulatory Visit: Payer: PPO | Admitting: Emergency Medicine

## 2019-08-14 ENCOUNTER — Ambulatory Visit: Payer: PPO

## 2019-08-14 ENCOUNTER — Ambulatory Visit (INDEPENDENT_AMBULATORY_CARE_PROVIDER_SITE_OTHER): Payer: PPO | Admitting: Emergency Medicine

## 2019-08-14 ENCOUNTER — Encounter: Payer: Self-pay | Admitting: Emergency Medicine

## 2019-08-14 ENCOUNTER — Other Ambulatory Visit: Payer: Self-pay

## 2019-08-14 DIAGNOSIS — Z77090 Contact with and (suspected) exposure to asbestos: Secondary | ICD-10-CM

## 2019-08-14 MED ORDER — DOXYCYCLINE HYCLATE 100 MG PO TABS: 100.0000 mg | ORAL_TABLET | Freq: Two times a day (BID) | ORAL | 0 refills | Status: DC

## 2019-08-14 NOTE — Progress Notes (Signed)
Virtual Visit via Telephone Note  I connected with Hannah Bates on 08/14/19 at 11:45 AM EST by telephone and verified that I am speaking with the correct person using two identifiers.  Location: Patient: Home Provider: Office   I discussed the limitations, risks, security and privacy concerns of performing an evaluation and management service by telephone and the availability of in person appointments. I also discussed with the patient that there may be a patient responsible charge related to this service. The patient expressed understanding and agreed to proceed.   History of Present Illness: 86 year old never smoker who has an indirect asbestos exposure from her husband who worked with the substance.  She was exposed on his clothing.  She never had an abnormal chest x-ray to her knowledge   Observations/Objective: A chest x-ray done when I met her a year ago 08/10/2018 did not show any evidence of interstitial disease or pleural plaques. She reports today that she started to develop cough in mid-January after an increase in clear PND on loratadine  Assessment and Plan: Allergic rhinitis, now with superimposed acute bronchitis:  Try adding OTC flonase + her chronic loratadine Doxycycline x 7 days for bronchitis Go ahead and get COVID vaccine shot #2  Asbestos exposure history We will repeat CXR to follow for stability; plan to get annually  Follow Up Instructions: 1 year or prn.   I discussed the assessment and treatment plan with the patient. The patient was provided an opportunity to ask questions and all were answered. The patient agreed with the plan and demonstrated an understanding of the instructions.   The patient was advised to call back or seek an in-person evaluation if the symptoms worsen or if the condition fails to improve as anticipated.  I provided 15 minutes of non-face-to-face time during this encounter.   Collene Gobble, MD

## 2019-08-17 ENCOUNTER — Telehealth: Payer: Self-pay | Admitting: Emergency Medicine

## 2019-08-17 NOTE — Telephone Encounter (Signed)
Spoke to patient to let her know the CXR does not require appt.   Pt verbalized understanding & nothing further needed at this time.

## 2019-08-17 NOTE — Telephone Encounter (Addendum)
Working on this one.  There is a CT order for pt's spouse.  But not one for this patient.  Opening message in spouse's chart for the ordered CT & forwarding this to triage to see if an order needs to be placed.

## 2019-08-17 NOTE — Telephone Encounter (Signed)
Sorry - I meant to send to [facility]

## 2019-08-20 ENCOUNTER — Ambulatory Visit
Admission: RE | Admit: 2019-08-20 | Discharge: 2019-08-20 | Disposition: A | Discharge status: Home / Self Care | Payer: PPO | Source: Ambulatory Visit | Attending: Emergency Medicine | Admitting: Emergency Medicine

## 2019-08-20 DIAGNOSIS — Z77090 Contact with and (suspected) exposure to asbestos: Secondary | ICD-10-CM | POA: Diagnosis not present

## 2019-08-20 IMAGING — CR DG CHEST 2V
2 series · 2 of 2 positions shown · non-contrast
Comparison: [DATE].

CLINICAL DATA: Asbestos exposure.

EXAM:
CHEST - 2 VIEW

[w chest pa]
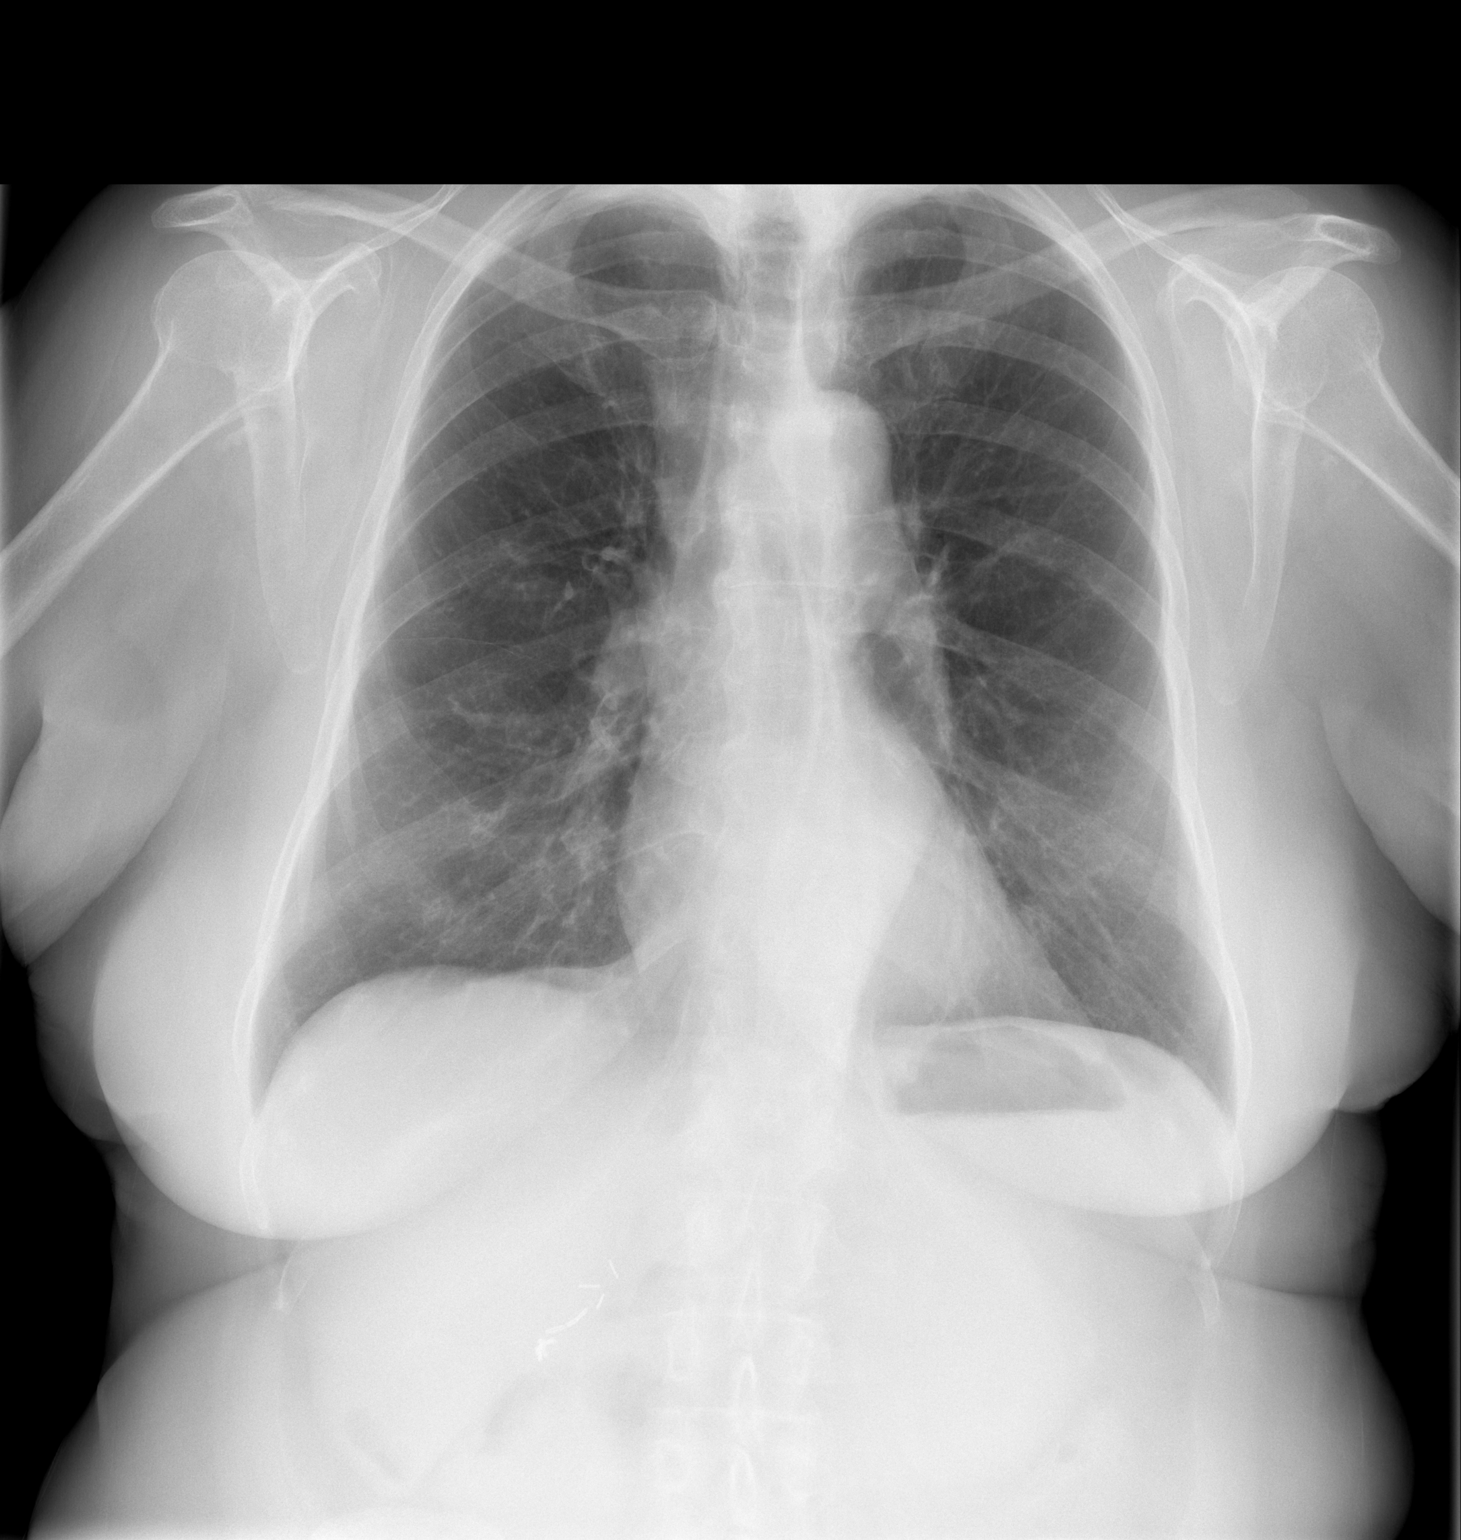

[w chest lat]
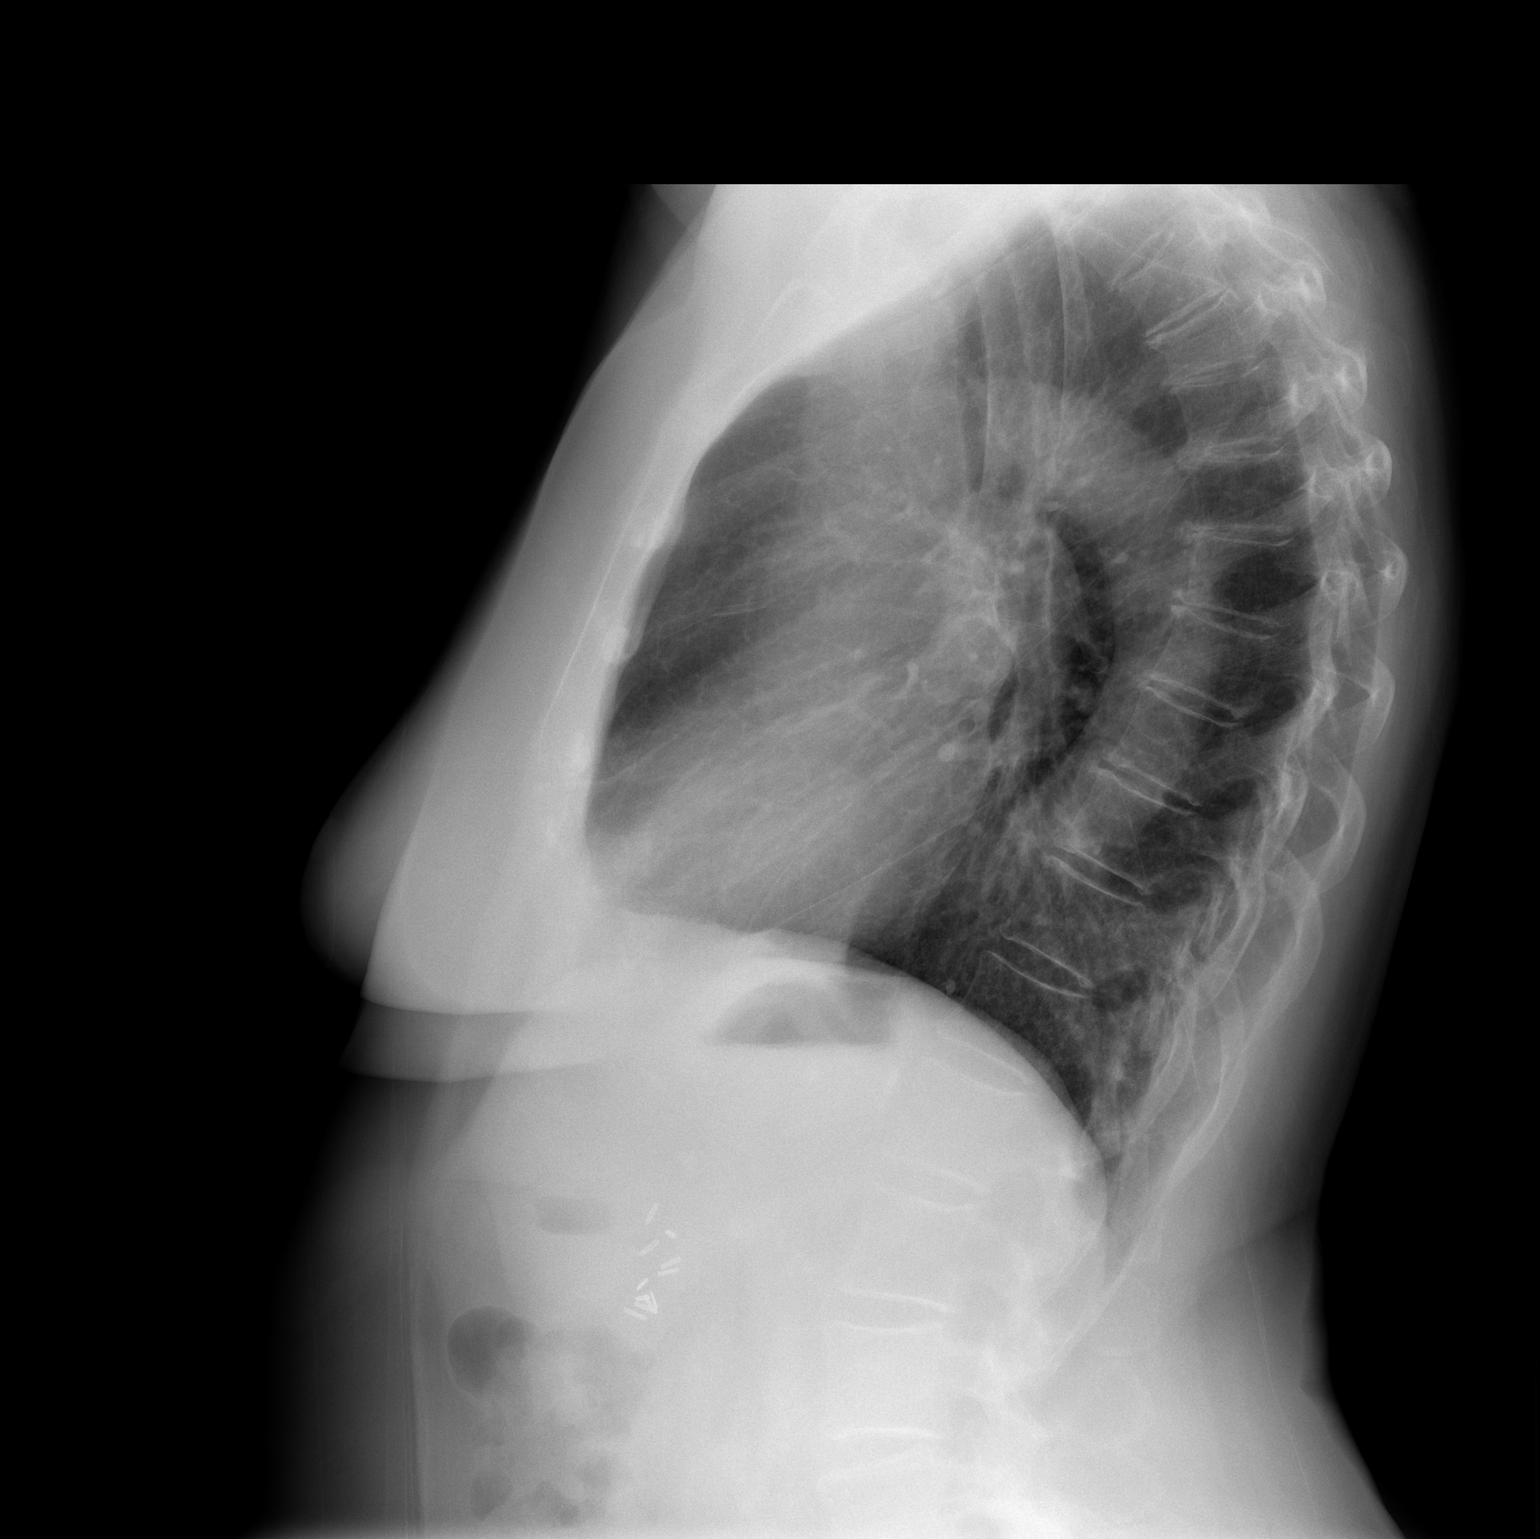

[2 of 2 positions shown; findings below may reference images not displayed]

FINDINGS: Trachea is midline. Heart size normal. Lungs are hyperinflated but
clear. No pleural fluid.
IMPRESSION: No acute findings.

## 2019-09-14 ENCOUNTER — Other Ambulatory Visit: Payer: Self-pay | Admitting: Family Medicine

## 2019-09-14 DIAGNOSIS — Z1231 Encounter for screening mammogram for malignant neoplasm of breast: Secondary | ICD-10-CM

## 2019-10-03 DIAGNOSIS — K219 Gastro-esophageal reflux disease without esophagitis: Secondary | ICD-10-CM | POA: Diagnosis not present

## 2019-10-03 DIAGNOSIS — R030 Elevated blood-pressure reading, without diagnosis of hypertension: Secondary | ICD-10-CM | POA: Diagnosis not present

## 2019-10-03 DIAGNOSIS — M810 Age-related osteoporosis without current pathological fracture: Secondary | ICD-10-CM | POA: Diagnosis not present

## 2019-10-03 DIAGNOSIS — N1831 Chronic kidney disease, stage 3a: Secondary | ICD-10-CM | POA: Diagnosis not present

## 2019-10-18 ENCOUNTER — Ambulatory Visit
Admission: RE | Admit: 2019-10-18 | Discharge: 2019-10-18 | Disposition: A | Discharge status: Home / Self Care | Payer: PPO | Source: Ambulatory Visit | Attending: Family Medicine | Admitting: Family Medicine

## 2019-10-18 ENCOUNTER — Other Ambulatory Visit: Payer: Self-pay

## 2019-10-18 DIAGNOSIS — Z1231 Encounter for screening mammogram for malignant neoplasm of breast: Secondary | ICD-10-CM

## 2019-10-18 IMAGING — MG DIGITAL SCREENING BILAT W/ TOMO W/ CAD
6 of 10 series · 6 of 30 positions shown · non-contrast
Comparison: Previous exam(s).

CLINICAL DATA: Screening.

EXAM:
DIGITAL SCREENING BILATERAL MAMMOGRAM WITH TOMO AND CAD

[L MLO synth-2D (1 of 2)]
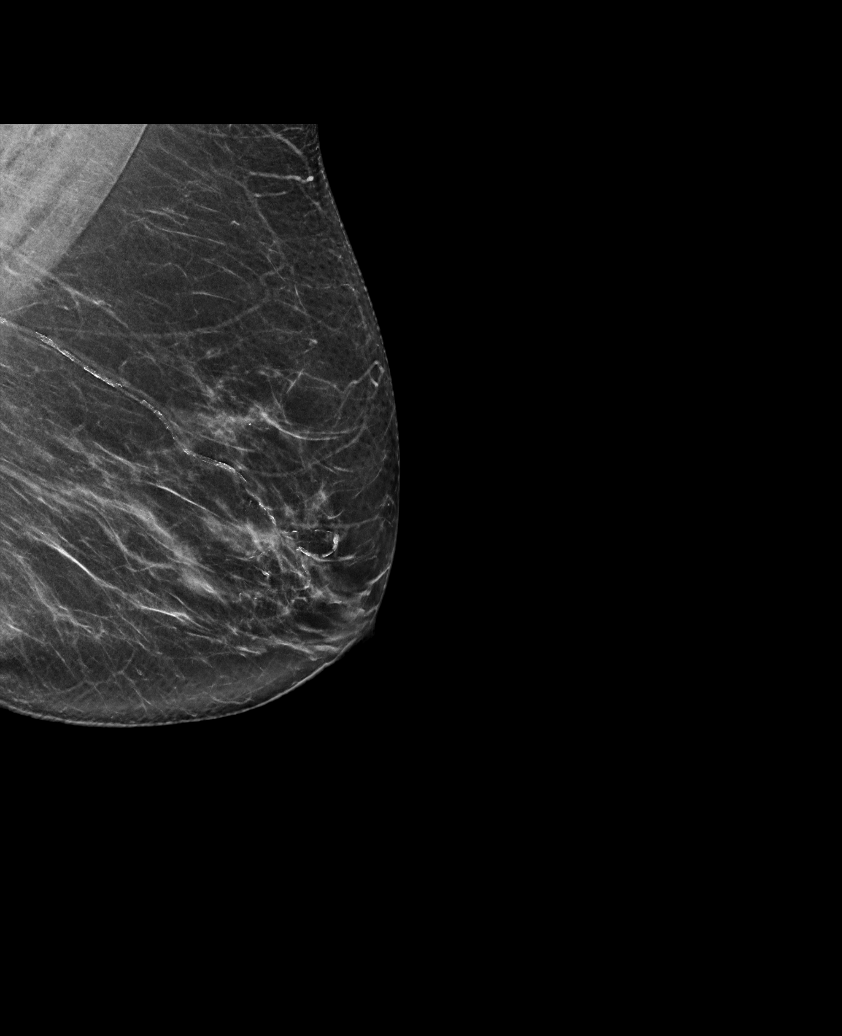

[L MLO synth-2D (2 of 2)]
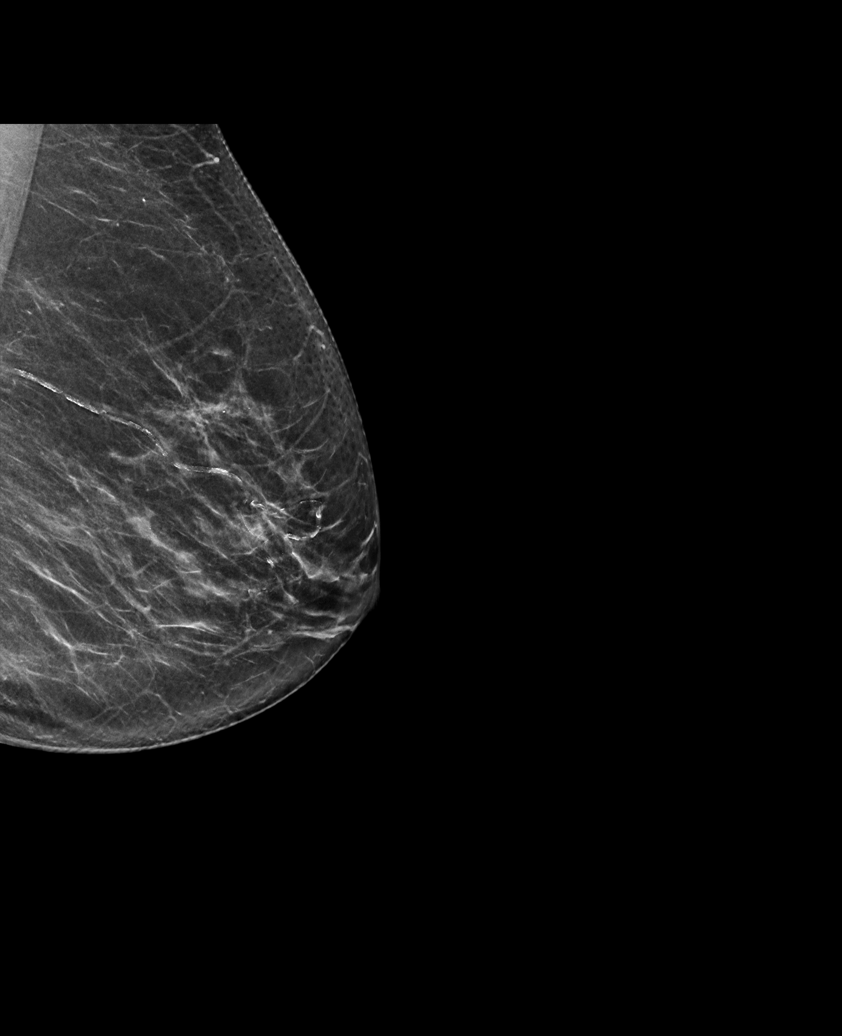

[L CC synth-2D]
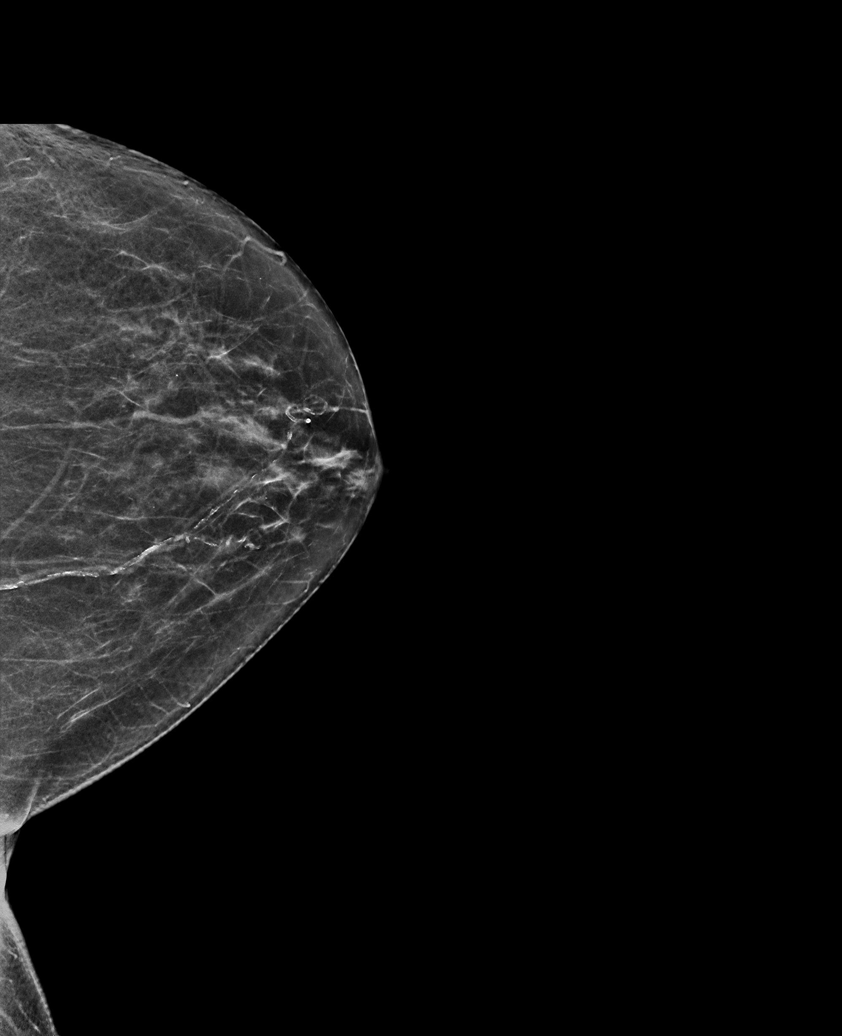

[R CC synth-2D]
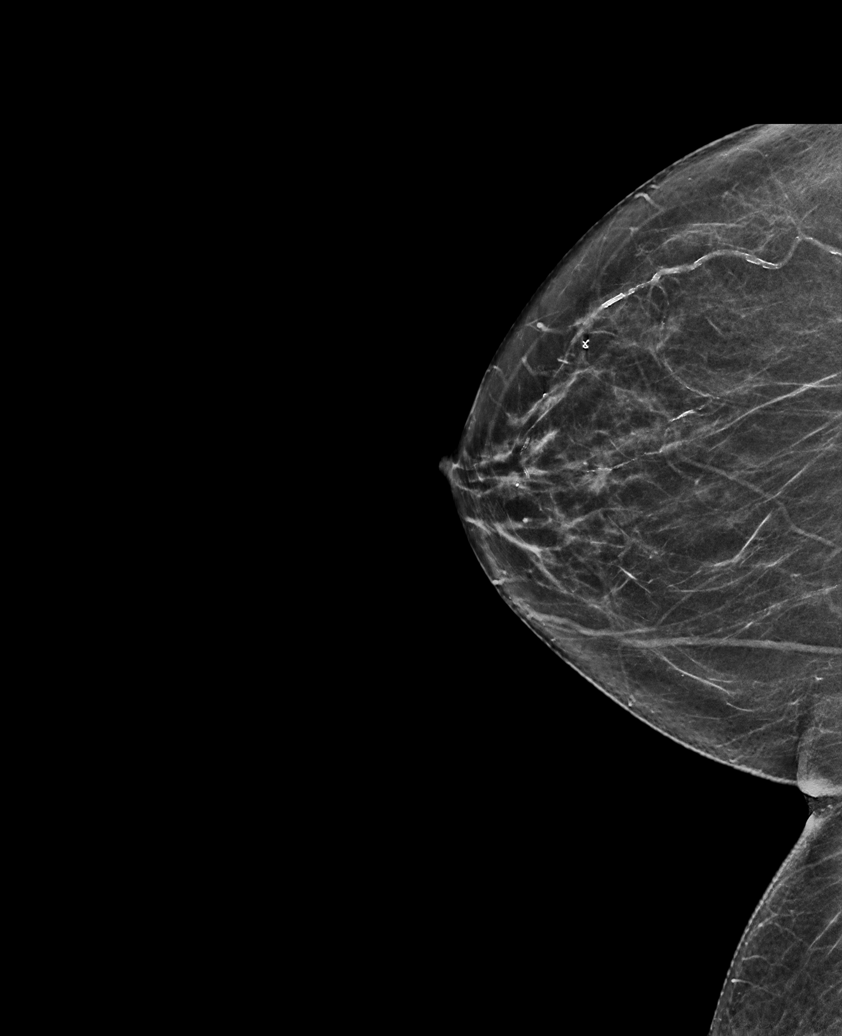

[R MLO synth-2D]
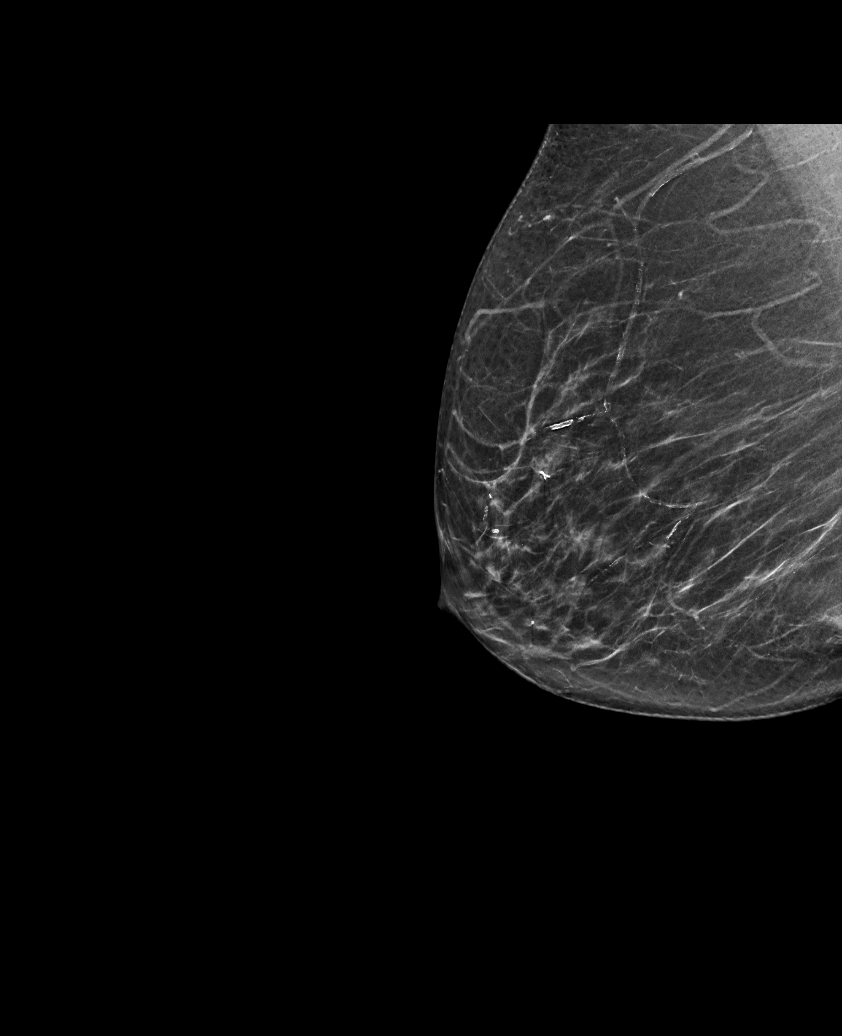

[R CC tomo · tomo slice 31/62.0]
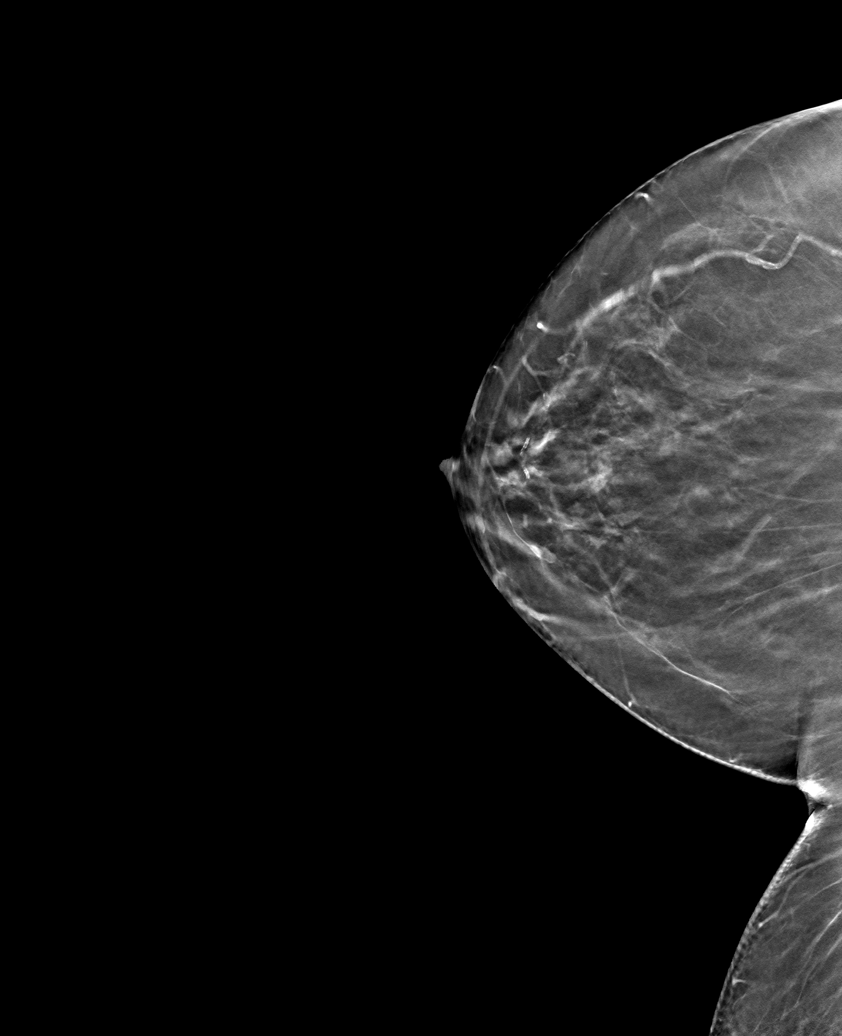

[6 of 30 positions shown; findings below may reference images not displayed]

ACR Breast Density Category b: There are scattered areas of
fibroglandular density.
FINDINGS: There are no findings suspicious for malignancy. Images were
processed with CAD.
IMPRESSION: No mammographic evidence of malignancy. A result letter of this
screening mammogram will be mailed directly to the patient.

RECOMMENDATION:
Screening mammogram in one year. (Code:[TQ])

BI-RADS CATEGORY  1: Negative.

## 2019-12-17 DIAGNOSIS — H4321 Crystalline deposits in vitreous body, right eye: Secondary | ICD-10-CM | POA: Diagnosis not present

## 2019-12-17 DIAGNOSIS — Z961 Presence of intraocular lens: Secondary | ICD-10-CM | POA: Diagnosis not present

## 2019-12-17 DIAGNOSIS — D3131 Benign neoplasm of right choroid: Secondary | ICD-10-CM | POA: Diagnosis not present

## 2019-12-17 DIAGNOSIS — H10413 Chronic giant papillary conjunctivitis, bilateral: Secondary | ICD-10-CM | POA: Diagnosis not present

## 2019-12-17 DIAGNOSIS — H35363 Drusen (degenerative) of macula, bilateral: Secondary | ICD-10-CM | POA: Diagnosis not present

## 2019-12-17 DIAGNOSIS — H04123 Dry eye syndrome of bilateral lacrimal glands: Secondary | ICD-10-CM | POA: Diagnosis not present

## 2019-12-22 ENCOUNTER — Other Ambulatory Visit: Payer: Self-pay

## 2019-12-22 ENCOUNTER — Encounter (HOSPITAL_COMMUNITY): Payer: Self-pay

## 2019-12-22 ENCOUNTER — Ambulatory Visit (HOSPITAL_COMMUNITY)
Admission: EM | Admit: 2019-12-22 | Discharge: 2019-12-22 | Disposition: A | Discharge status: Home / Self Care | Payer: PPO | Attending: Family Medicine | Admitting: Family Medicine

## 2019-12-22 DIAGNOSIS — R21 Rash and other nonspecific skin eruption: Secondary | ICD-10-CM

## 2019-12-22 DIAGNOSIS — W57XXXA Bitten or stung by nonvenomous insect and other nonvenomous arthropods, initial encounter: Secondary | ICD-10-CM

## 2019-12-22 MED ORDER — TRIAMCINOLONE ACETONIDE 0.1 % EX CREA: 1.0000 "application " | TOPICAL_CREAM | Freq: Two times a day (BID) | CUTANEOUS | 0 refills | Status: DC

## 2019-12-22 NOTE — ED Provider Notes (Signed)
[facility]   [id number] 12/22/19 Arrival Time: 3875  ASSESSMENT & PLAN:  1. Insect bite, unspecified site, initial encounter     No signs of skin infection or zoster. Discussed.  Begin using: Meds ordered this encounter  Medications  . triamcinolone cream (KENALOG) 0.1 %    Sig: Apply 1 application topically 2 (two) times daily.    Dispense:  30 g    Refill:  0    Will follow up with PCP or here if worsening or failing to improve as anticipated. Reviewed expectations re: course of current medical issues. Questions answered. Outlined signs and symptoms indicating need for more acute intervention. Patient verbalized understanding. After Visit Summary given.   SUBJECTIVE:  Hannah Bates is a 87 y.o. female who presents with a skin complaint. Left upper back. Questions insect bite yesterday with erythema and significant itching. OTC salve without much relief. No pain. Afebrile.  OBJECTIVE: Vitals:   12/22/19 1146  BP: (!) 151/68  Pulse: 80  Resp: 18  Temp: 98 F (36.7 C)  TempSrc: Oral  SpO2: 96%    General appearance: alert; no distress HEENT: [place]; AT Neck: supple with FROM Extremities: no edema; moves all extremities normally Skin: warm and dry; signs of infection: no; small patch of erythematous skin over left upper back with small central raised but soft area consistent with insect bite; no areas of fluctuance; warm Psychological: alert and cooperative; normal mood and affect  Allergies  Allergen Reactions  . Codeine     REACTION: Rash  . Sulfonamide Derivatives     REACTION: Rash  . Other Rash    Event monitor leads need....sensitive    Past Medical History:  Diagnosis Date  . Arthritis   . Osteoporosis    Social History   Socioeconomic History  . Marital status: Married    Spouse name: Not on file  . Number of children: Not on file  . Years of education: Not on file  . Highest education level: Not on file  Occupational  History  . Not on file  Tobacco Use  . Smoking status: Never Smoker  . Smokeless tobacco: Never Used  Substance and Sexual Activity  . Alcohol use: No    Alcohol/week: 0.0 standard drinks  . Drug use: No  . Sexual activity: Not on file  Other Topics Concern  . Not on file  Social History Narrative  . Not on file   Social Determinants of Health   Financial Resource Strain:   . Difficulty of Paying Living Expenses:   Food Insecurity:   . Worried About Charity fundraiser in the Last Year:   . Arboriculturist in the Last Year:   Transportation Needs:   . Film/video editor (Medical):   Marland Kitchen Lack of Transportation (Non-Medical):   Physical Activity:   . Days of Exercise per Week:   . Minutes of Exercise per Session:   Stress:   . Feeling of Stress :   Social Connections:   . Frequency of Communication with Friends and Family:   . Frequency of Social Gatherings with Friends and Family:   . Attends Religious Services:   . Active Member of Clubs or Organizations:   . Attends Archivist Meetings:   Marland Kitchen Marital Status:   Intimate Partner Violence:   . Fear of Current or Ex-Partner:   . Emotionally Abused:   Marland Kitchen Physically Abused:   . Sexually Abused:    Family History  Problem Relation Age of Onset  . Hyperlipidemia Mother   . Heart disease Father   . Hyperlipidemia Father   . Hypertension Father   . Lung disease Father        black lung- miner   . Cancer Sister   . Heart disease Sister   . Hyperlipidemia Sister   . Hypertension Sister   . Breast cancer Other    Past Surgical History:  Procedure Laterality Date  . BREAST CYST ASPIRATION Bilateral   . CHOLECYSTECTOMY    . EYE SURGERY    . FINGER GANGLION CYST EXCISION     rt thumb     Vanessa Kick, MD 12/22/19 1244

## 2019-12-22 NOTE — ED Triage Notes (Signed)
Pt presents to UC with itchy rash x 1 day. Pt think she was bitten by an insect.

## 2019-12-31 DIAGNOSIS — M25551 Pain in right hip: Secondary | ICD-10-CM | POA: Diagnosis not present

## 2020-01-01 ENCOUNTER — Other Ambulatory Visit: Payer: Self-pay | Admitting: Family Medicine

## 2020-01-01 ENCOUNTER — Ambulatory Visit
Admission: RE | Admit: 2020-01-01 | Discharge: 2020-01-01 | Disposition: A | Discharge status: Home / Self Care | Payer: PPO | Source: Ambulatory Visit | Attending: Family Medicine | Admitting: Family Medicine

## 2020-01-01 DIAGNOSIS — M25551 Pain in right hip: Secondary | ICD-10-CM

## 2020-01-01 IMAGING — DX DG HIP (WITH OR WITHOUT PELVIS) 2-3V*R*
2 series · 2 of 2 positions shown · non-contrast
Comparison: None.

CLINICAL DATA: Worsening right hip pain, history of osteoporosis

EXAM:
DG HIP (WITH OR WITHOUT PELVIS) 2-3V RIGHT

[dg hip unilat w or w/o pelvis 2-3 views  (1 of 2)]
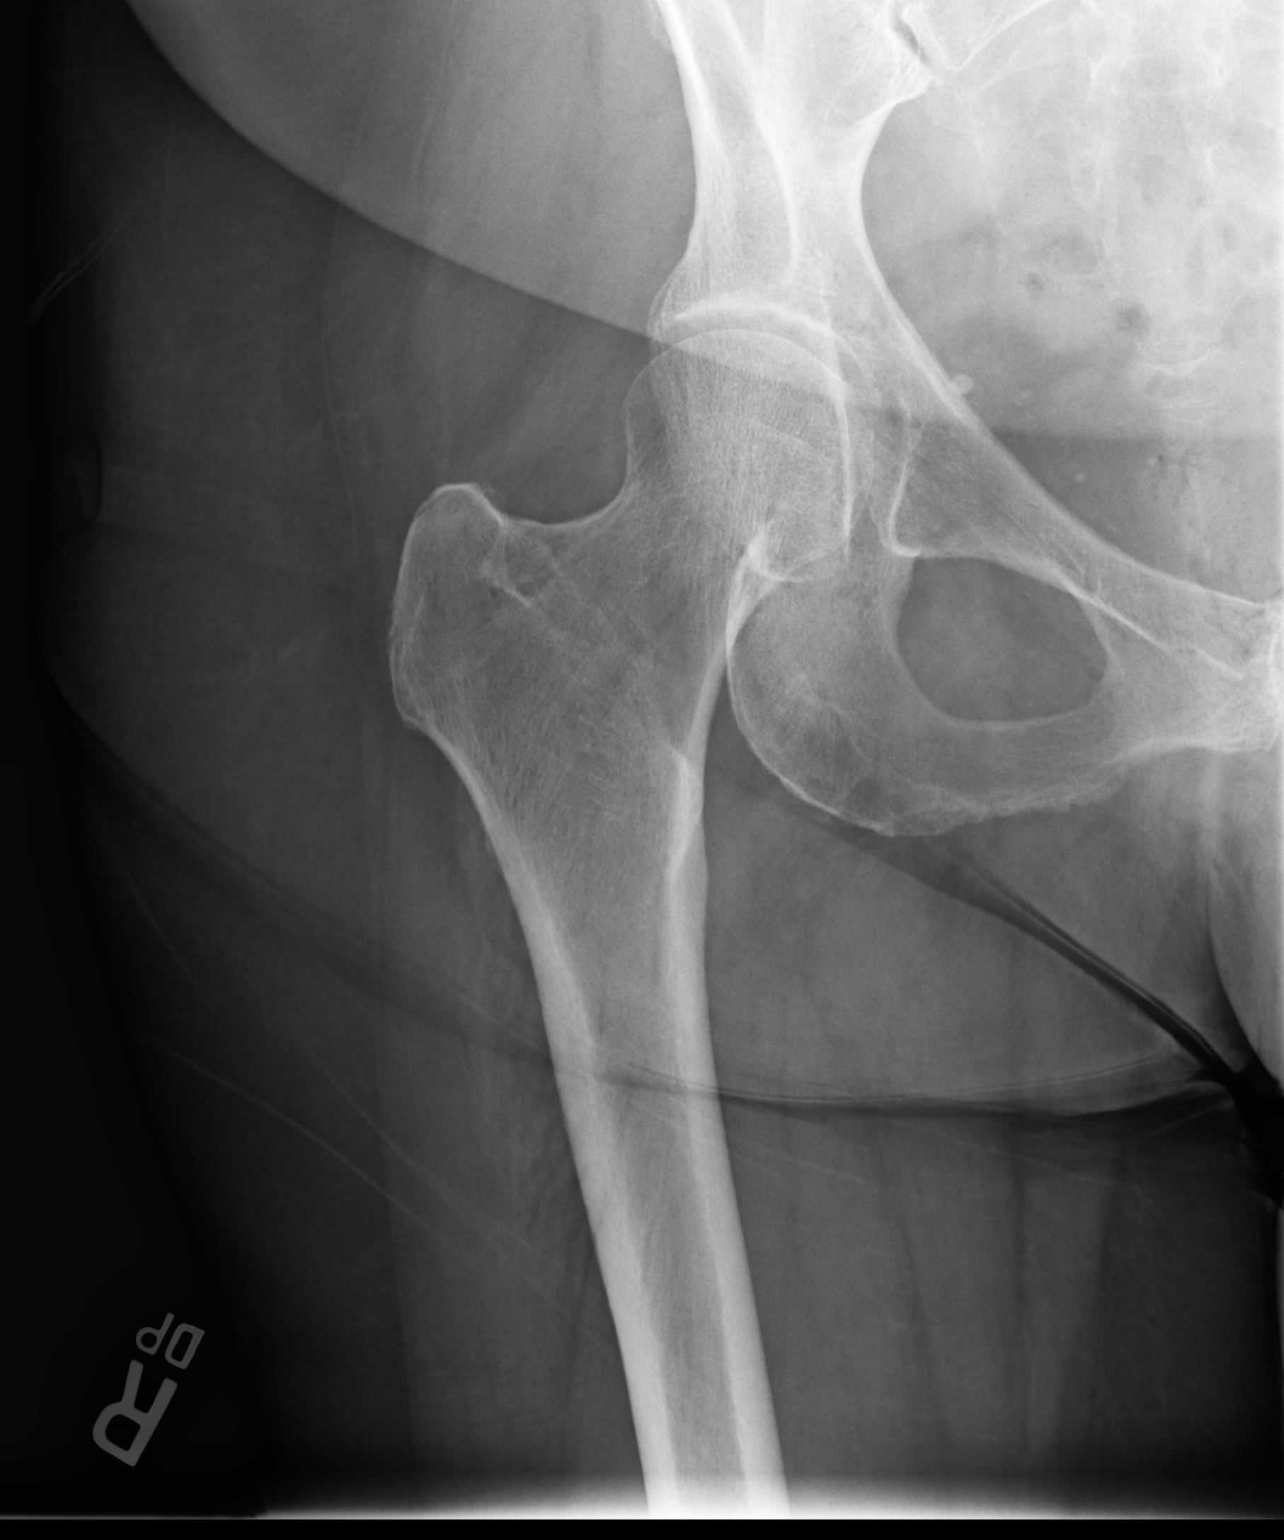

[dg hip unilat w or w/o pelvis 2-3 views  (2 of 2)]
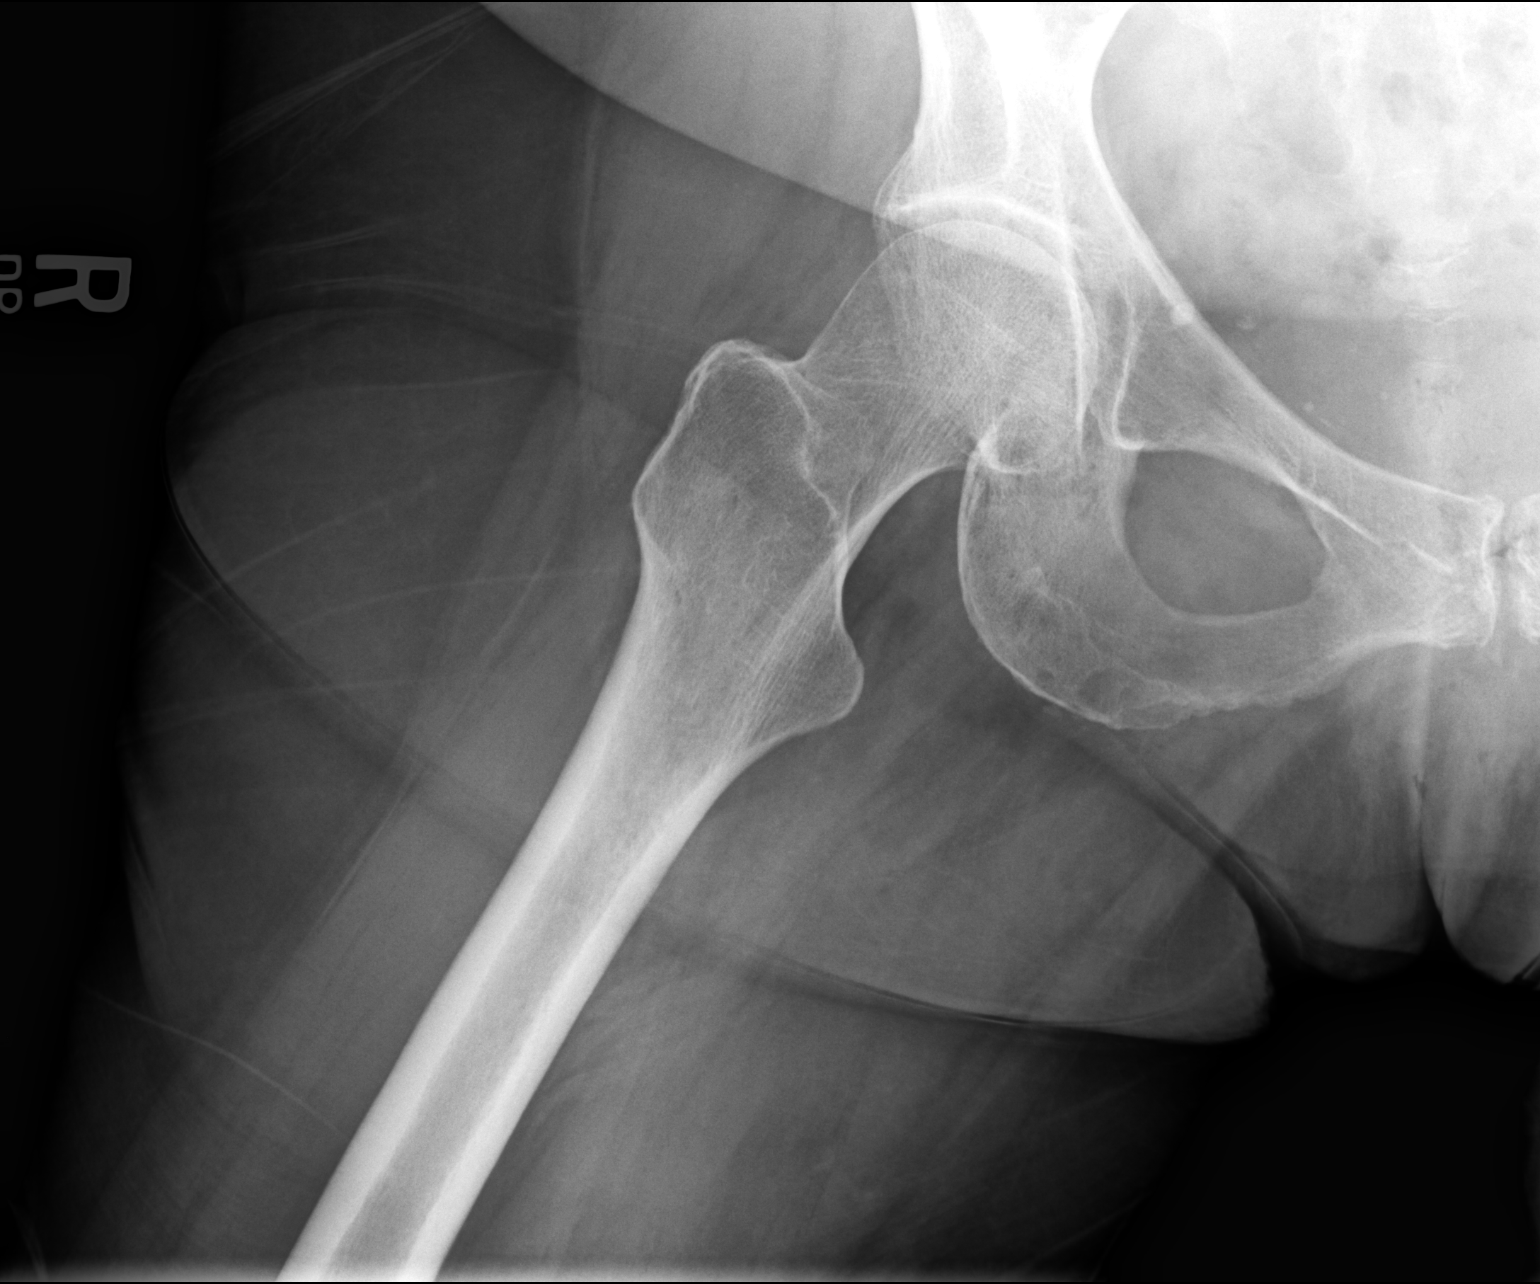

[2 of 2 positions shown; findings below may reference images not displayed]

FINDINGS: Alignment is anatomic. There is no acute fracture. Joint spaces are
preserved.
IMPRESSION: Negative.

## 2020-02-22 DIAGNOSIS — R5383 Other fatigue: Secondary | ICD-10-CM | POA: Diagnosis not present

## 2020-02-23 DIAGNOSIS — Z20822 Contact with and (suspected) exposure to covid-19: Secondary | ICD-10-CM | POA: Diagnosis not present

## 2020-02-25 DIAGNOSIS — M25551 Pain in right hip: Secondary | ICD-10-CM | POA: Diagnosis not present

## 2020-02-25 DIAGNOSIS — M7061 Trochanteric bursitis, right hip: Secondary | ICD-10-CM | POA: Diagnosis not present

## 2020-04-09 DIAGNOSIS — M7061 Trochanteric bursitis, right hip: Secondary | ICD-10-CM | POA: Diagnosis not present

## 2020-04-09 DIAGNOSIS — M25551 Pain in right hip: Secondary | ICD-10-CM | POA: Diagnosis not present

## 2020-04-14 DIAGNOSIS — Z0000 Encounter for general adult medical examination without abnormal findings: Secondary | ICD-10-CM | POA: Diagnosis not present

## 2020-04-14 DIAGNOSIS — R030 Elevated blood-pressure reading, without diagnosis of hypertension: Secondary | ICD-10-CM | POA: Diagnosis not present

## 2020-04-14 DIAGNOSIS — K219 Gastro-esophageal reflux disease without esophagitis: Secondary | ICD-10-CM | POA: Diagnosis not present

## 2020-04-14 DIAGNOSIS — R413 Other amnesia: Secondary | ICD-10-CM | POA: Diagnosis not present

## 2020-04-14 DIAGNOSIS — Z23 Encounter for immunization: Secondary | ICD-10-CM | POA: Diagnosis not present

## 2020-04-14 DIAGNOSIS — E7800 Pure hypercholesterolemia, unspecified: Secondary | ICD-10-CM | POA: Diagnosis not present

## 2020-04-14 DIAGNOSIS — M810 Age-related osteoporosis without current pathological fracture: Secondary | ICD-10-CM | POA: Diagnosis not present

## 2020-06-16 DIAGNOSIS — M7061 Trochanteric bursitis, right hip: Secondary | ICD-10-CM | POA: Diagnosis not present

## 2020-07-14 DIAGNOSIS — D225 Melanocytic nevi of trunk: Secondary | ICD-10-CM | POA: Diagnosis not present

## 2020-07-14 DIAGNOSIS — L814 Other melanin hyperpigmentation: Secondary | ICD-10-CM | POA: Diagnosis not present

## 2020-07-14 DIAGNOSIS — L821 Other seborrheic keratosis: Secondary | ICD-10-CM | POA: Diagnosis not present

## 2020-07-14 DIAGNOSIS — D1801 Hemangioma of skin and subcutaneous tissue: Secondary | ICD-10-CM | POA: Diagnosis not present

## 2020-09-09 DIAGNOSIS — N368 Other specified disorders of urethra: Secondary | ICD-10-CM | POA: Diagnosis not present

## 2020-09-09 DIAGNOSIS — R350 Frequency of micturition: Secondary | ICD-10-CM | POA: Diagnosis not present

## 2020-09-10 DIAGNOSIS — M7989 Other specified soft tissue disorders: Secondary | ICD-10-CM | POA: Diagnosis not present

## 2020-09-10 DIAGNOSIS — R238 Other skin changes: Secondary | ICD-10-CM | POA: Diagnosis not present

## 2020-09-16 ENCOUNTER — Other Ambulatory Visit: Payer: Self-pay | Admitting: Family Medicine

## 2020-09-16 DIAGNOSIS — Z1231 Encounter for screening mammogram for malignant neoplasm of breast: Secondary | ICD-10-CM

## 2020-09-23 ENCOUNTER — Other Ambulatory Visit: Payer: Self-pay | Admitting: Family Medicine

## 2020-09-23 DIAGNOSIS — M810 Age-related osteoporosis without current pathological fracture: Secondary | ICD-10-CM

## 2020-10-01 DIAGNOSIS — K5792 Diverticulitis of intestine, part unspecified, without perforation or abscess without bleeding: Secondary | ICD-10-CM | POA: Diagnosis not present

## 2020-10-09 ENCOUNTER — Other Ambulatory Visit: Payer: Self-pay

## 2020-10-10 ENCOUNTER — Ambulatory Visit: Payer: PPO | Admitting: Cardiology

## 2020-10-10 ENCOUNTER — Other Ambulatory Visit: Payer: Self-pay

## 2020-10-10 ENCOUNTER — Encounter: Payer: Self-pay | Admitting: Cardiology

## 2020-10-10 VITALS — BP 156/96 | HR 87 | Ht 60.0 in | Wt 149.4 lb

## 2020-10-10 DIAGNOSIS — I493 Ventricular premature depolarization: Secondary | ICD-10-CM | POA: Diagnosis not present

## 2020-10-10 DIAGNOSIS — R600 Localized edema: Secondary | ICD-10-CM

## 2020-10-10 NOTE — Addendum Note (Signed)
Addended by: Thompson Grayer on: 10/10/2020 08:58 AM   Modules accepted: Orders

## 2020-10-10 NOTE — Progress Notes (Addendum)
Cardiology Contult Note    Date:  10/10/2020   ID:  Hannah Bates, DOB 16-Aug-1933, MRN [id number]  PCP:  [name], MD  Cardiologist:  Fransico Him, MD   Chief Complaint  Patient presents with  . New Patient (Initial Visit)    LE edema    History of Present Illness:  Hannah Bates is a 87 y.o. female who is being seen today for the evaluation of LE edema and intermittent nausea at the request of [name], *.   She denies any chest pain or pressure, SOB, DOE, PND, orthopnea, dizziness, palpitations or syncope.  She tells me that recently she has had some problems with LE edema which is not new for her but had been intermittent in the past and now much worse than before.  The right leg swells more than the left.  The swelling is mainly in the right ankle and foot.  She has had issues with bursitis in her hip before.  She is compliant with her meds and is tolerating meds with no SE.    Past Medical History:  Diagnosis Date  . Arthritis   . Osteoporosis   . Palpitations   . PVC (premature ventricular contraction)     Past Surgical History:  Procedure Laterality Date  . BREAST CYST ASPIRATION Bilateral   . CHOLECYSTECTOMY    . EYE SURGERY    . FINGER GANGLION CYST EXCISION     rt thumb    Current Medications: Current Meds  Medication Sig  . Acetaminophen (TYLENOL PO) Take 1,000 mg by mouth daily as needed (pain/headache).   Marland Kitchen atorvastatin (LIPITOR) 20 MG tablet Take 20 mg by mouth daily.  . Biotin 10000 MCG TABS Take 1 tablet by mouth daily.  . calcium citrate (CALCITRATE - DOSED IN MG ELEMENTAL CALCIUM) 950 MG tablet Take 200 mg of elemental calcium by mouth daily.  . Cholecalciferol (VITAMIN D3) 5000 units CAPS Take 5,000 Units by mouth daily.  Marland Kitchen estradiol (ESTRACE) 0.1 MG/GM vaginal cream PLACE A PEA- SIZED AMOUNT IN THE VAGINA NIGHTLY FOR 2 WEEKS, THEN USE EVERY OTHER NIGHT  . famotidine (PEPCID) 20 MG tablet Take 20 mg by mouth  daily.  . Loratadine (CLARITIN PO) Take 1 tablet by mouth daily.  . Multiple Vitamins-Minerals (MULTIVITAMIN WITH MINERALS) tablet Take 1 tablet by mouth daily.  Marland Kitchen omeprazole (PRILOSEC) 20 MG capsule 1 capsule 30 minutes before morning meal  . triamcinolone cream (KENALOG) 0.1 % Apply 1 application topically 2 (two) times daily.    Allergies:   Codeine, Sulfonamide derivatives, and Other   Social History   Socioeconomic History  . Marital status: Married    Spouse name: Not on file  . Number of children: Not on file  . Years of education: Not on file  . Highest education level: Not on file  Occupational History  . Not on file  Tobacco Use  . Smoking status: Never Smoker  . Smokeless tobacco: Never Used  Substance and Sexual Activity  . Alcohol use: No    Alcohol/week: 0.0 standard drinks  . Drug use: No  . Sexual activity: Not on file  Other Topics Concern  . Not on file  Social History Narrative  . Not on file   Social Determinants of Health   Financial Resource Strain: Not on file  Food Insecurity: Not on file  Transportation Needs: Not on file  Physical Activity: Not on file  Stress: Not on file  Social Connections:  Not on file     Family History:  The patient's family history includes Breast cancer in an other family member; Cancer in her sister; Heart disease in her father and sister; Hyperlipidemia in her father, mother, and sister; Hypertension in her father and sister; Lung disease in her father.   ROS:   Please see the history of present illness.    ROS All other systems reviewed and are negative.  No flowsheet data found.     PHYSICAL EXAM:   VS:  BP (!) 156/96   Pulse 87   Ht 5' (1.524 m)   Wt 149 lb 6.4 oz (67.8 kg)   SpO2 97%   BMI 29.18 kg/m    GEN: Well nourished, well developed, in no acute distress  HEENT: normal  Neck: no JVD, carotid bruits, or masses Cardiac: RRR; no murmurs, rubs, or gallops.  Trace RLE ankle edema.  Intact distal  pulses bilaterally.  Respiratory:  clear to auscultation bilaterally, normal work of breathing GI: soft, nontender, nondistended, + BS MS: no deformity or atrophy  Skin: warm and dry, no rash Neuro:  Alert and Oriented x 3, Strength and sensation are intact Psych: euthymic mood, full affect  Wt Readings from Last 3 Encounters:  10/10/20 149 lb 6.4 oz (67.8 kg)  08/10/18 150 lb 12.8 oz (68.4 kg)  05/20/16 151 lb (68.5 kg)      Studies/Labs Reviewed:   EKG:  EKG is ordered today.  The ekg ordered today demonstrates NSR with no abnormalities  Recent Labs: No results found for requested labs within last 8760 hours.   Lipid Panel No results found for: CHOL, TRIG, HDL, CHOLHDL, VLDL, LDLCALC, LDLDIRECT  Additional studies/ records that were reviewed today include:  none    ASSESSMENT:    1. Leg edema   2. PVC's (premature ventricular contractions)      PLAN:  In order of problems listed above:  1.  LE edema -her right leg is visibly more swollen than the left in the ankle and foot but her left ankle swells some as well -recent TSH was normal at 2.78 -EKG is normal -she does admit to using table salt on occasion -I will get a 2D echo to assess LVF and RVF -check LE venous dopplers to rule out DVT  2.  PVCs -these are assymptomatic   Medication Adjustments/Labs and Tests Ordered: Current medicines are reviewed at length with the patient today.  Concerns regarding medicines are outlined above.  Medication changes, Labs and Tests ordered today are listed in the Patient Instructions below.  There are no Patient Instructions on file for this visit.   Signed, Fransico Him, MD  10/10/2020 8:50 AM    [facility] [place], [place], [place]  [address] Phone: [phone]; Fax: [phone]

## 2020-10-10 NOTE — Patient Instructions (Signed)
Medication Instructions:  Your physician recommends that you continue on your current medications as directed. Please refer to the Current Medication list given to you today.  *If you need a refill on your cardiac medications before your next appointment, please call your pharmacy*   Lab Work: none If you have labs (blood work) drawn today and your tests are completely normal, you will receive your results only by: Marland Kitchen MyChart Message (if you have MyChart) OR . A paper copy in the mail If you have any lab test that is abnormal or we need to change your treatment, we will call you to review the results.   Testing/Procedures: Your physician has requested that you have a lower  extremity venous duplex. This test is an ultrasound of the veins in the legs.. It looks at venous blood flow that carries blood from the heart to the legs. Allow one hour for a Lower Venous exam.  There are no restrictions or special instructions.   Your physician has requested that you have an echocardiogram. Echocardiography is a painless test that uses sound waves to create images of your heart. It provides your doctor with information about the size and shape of your heart and how well your heart's chambers and valves are working. This procedure takes approximately one hour. There are no restrictions for this procedure.    Follow-Up: At [facility], you and your health needs are our priority.  As part of our continuing mission to provide you with exceptional heart care, we have created designated Provider Care Teams.  These Care Teams include your primary Cardiologist (physician) and Advanced Practice Providers (APPs -  Physician Assistants and Nurse Practitioners) who all work together to provide you with the care you need, when you need it.  We recommend signing up for the patient portal called "MyChart".  Sign up information is provided on this After Visit Summary.  MyChart is used to connect with patients for  Virtual Visits (Telemedicine).  Patients are able to view lab/test results, encounter notes, upcoming appointments, etc.  Non-urgent messages can be sent to your provider as well.   To learn more about what you can do with MyChart, go to [email or web address].    Your next appointment:   As needed  The format for your next appointment:   In Person  Provider:   You may see Fransico Him, MD or one of the following Advanced Practice Providers on your designated Care Team:    Melina Copa, PA-C  Ermalinda Barrios, PA-C    Other Instructions

## 2020-10-13 DIAGNOSIS — M810 Age-related osteoporosis without current pathological fracture: Secondary | ICD-10-CM | POA: Diagnosis not present

## 2020-10-13 DIAGNOSIS — K219 Gastro-esophageal reflux disease without esophagitis: Secondary | ICD-10-CM | POA: Diagnosis not present

## 2020-10-13 DIAGNOSIS — E7800 Pure hypercholesterolemia, unspecified: Secondary | ICD-10-CM | POA: Diagnosis not present

## 2020-10-13 DIAGNOSIS — K5792 Diverticulitis of intestine, part unspecified, without perforation or abscess without bleeding: Secondary | ICD-10-CM | POA: Diagnosis not present

## 2020-10-13 DIAGNOSIS — N183 Chronic kidney disease, stage 3 unspecified: Secondary | ICD-10-CM | POA: Diagnosis not present

## 2020-10-14 ENCOUNTER — Ambulatory Visit (HOSPITAL_COMMUNITY)
Admission: RE | Admit: 2020-10-14 | Discharge: 2020-10-14 | Disposition: A | Discharge status: Home / Self Care | Payer: PPO | Source: Ambulatory Visit | Attending: Cardiovascular Disease | Admitting: Cardiovascular Disease

## 2020-10-14 ENCOUNTER — Other Ambulatory Visit: Payer: Self-pay

## 2020-10-14 DIAGNOSIS — R600 Localized edema: Secondary | ICD-10-CM | POA: Diagnosis not present

## 2020-10-31 DIAGNOSIS — N368 Other specified disorders of urethra: Secondary | ICD-10-CM | POA: Diagnosis not present

## 2020-11-10 ENCOUNTER — Ambulatory Visit: Payer: PPO

## 2020-11-10 ENCOUNTER — Other Ambulatory Visit: Payer: Self-pay

## 2020-11-10 ENCOUNTER — Ambulatory Visit
Admission: RE | Admit: 2020-11-10 | Discharge: 2020-11-10 | Disposition: A | Discharge status: Home / Self Care | Payer: PPO | Source: Ambulatory Visit | Attending: Family Medicine | Admitting: Family Medicine

## 2020-11-10 DIAGNOSIS — Z1231 Encounter for screening mammogram for malignant neoplasm of breast: Secondary | ICD-10-CM | POA: Diagnosis not present

## 2020-11-10 IMAGING — MG MM DIGITAL SCREENING BILAT W/ TOMO AND CAD
8 series · 8 of 24 positions shown · non-contrast
Comparison: Previous exam(s).

CLINICAL DATA: Screening.

EXAM:
DIGITAL SCREENING BILATERAL MAMMOGRAM WITH TOMOSYNTHESIS AND CAD
TECHNIQUE: Bilateral screening digital craniocaudal and mediolateral oblique
mammograms were obtained. Bilateral screening digital breast
tomosynthesis was performed. The images were evaluated with
computer-aided detection.

[R CC synth-2D]
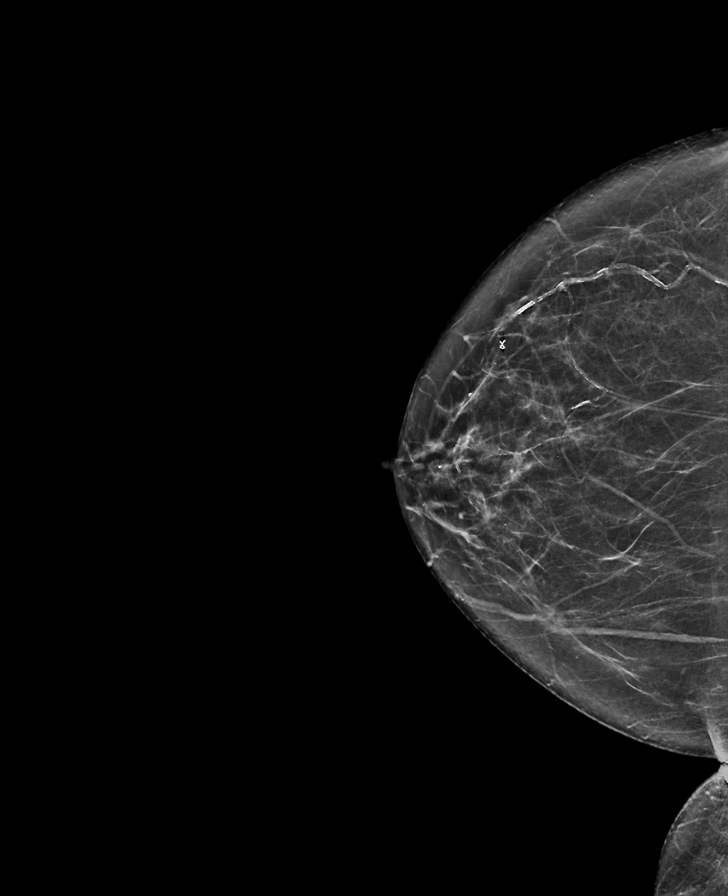

[L CC synth-2D]
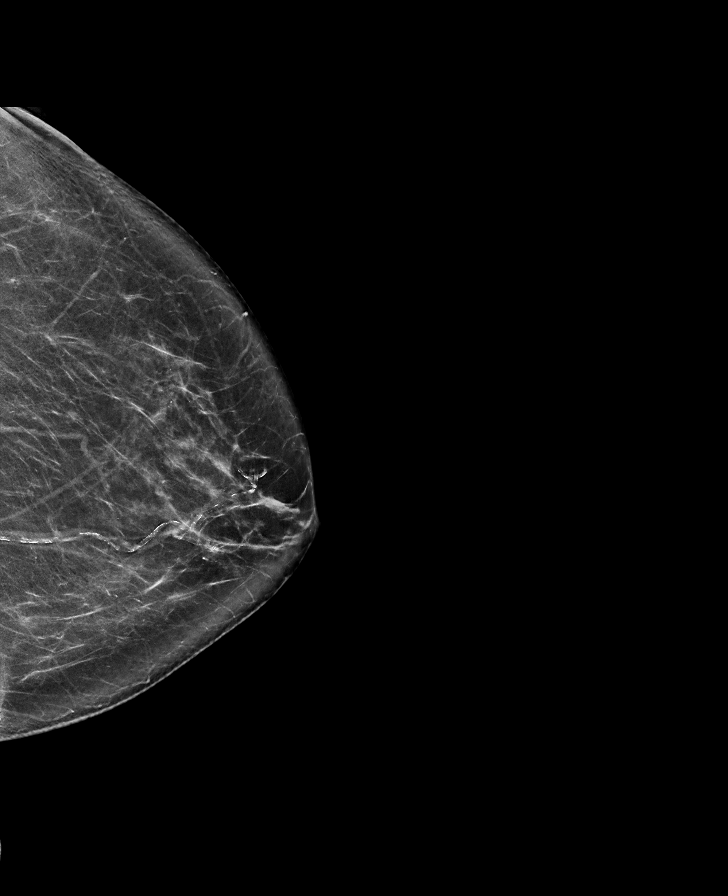

[R MLO synth-2D]
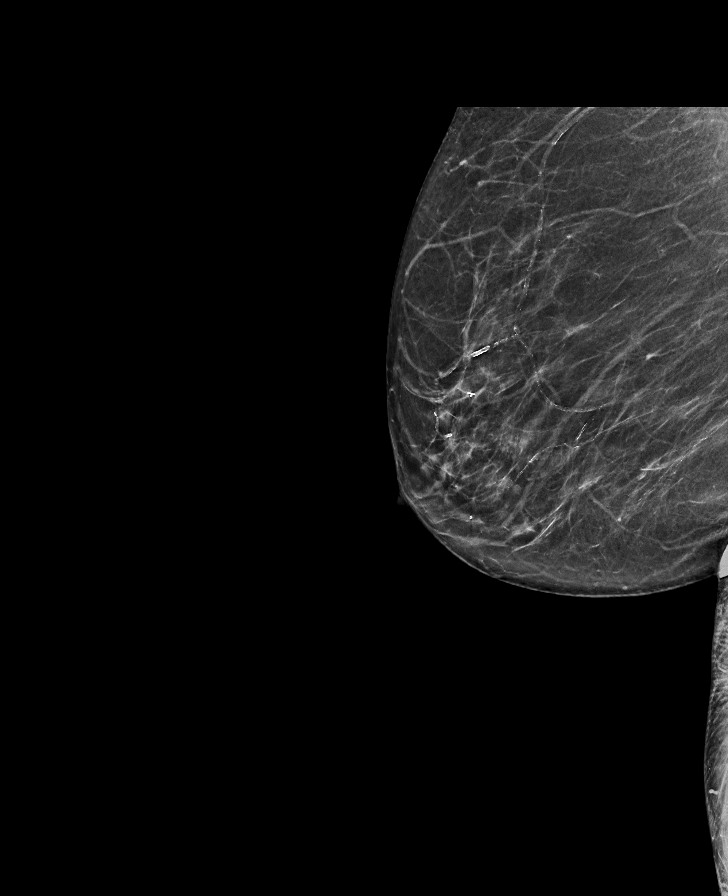

[L MLO synth-2D]
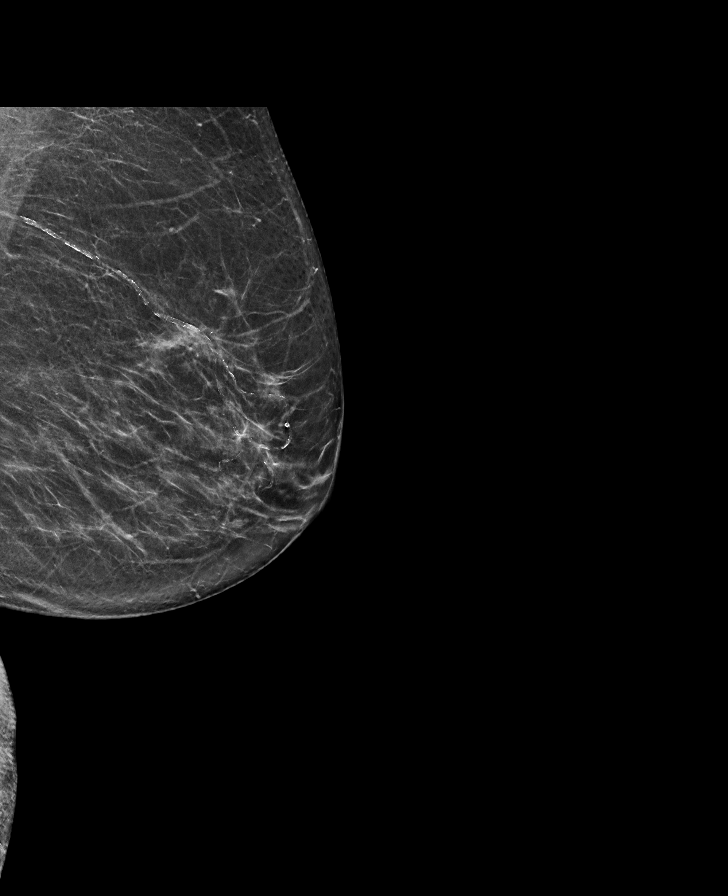

[R CC tomo · tomo slice 34/67.0]
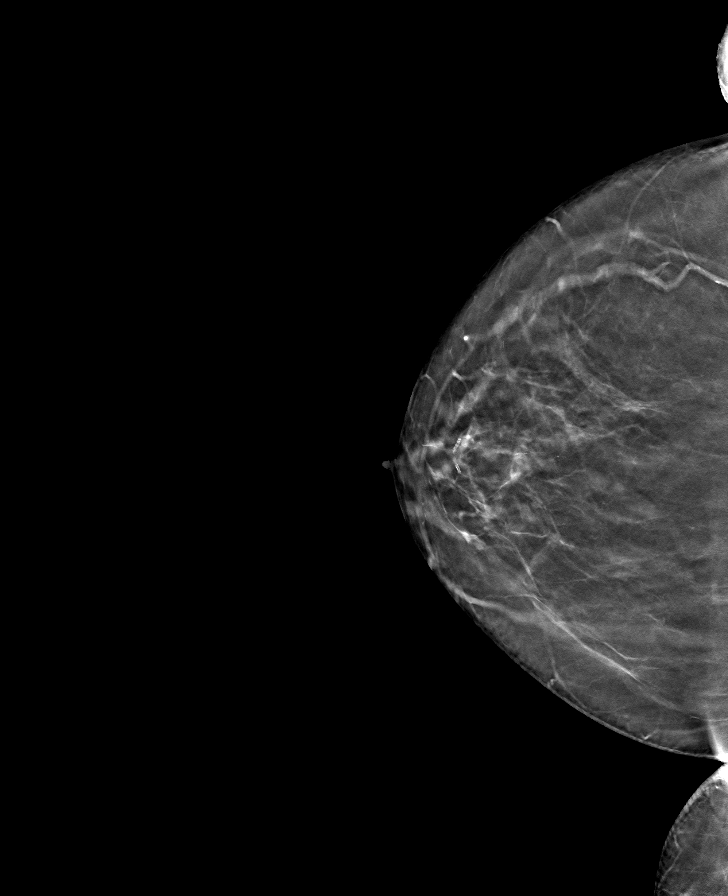

[R MLO tomo · tomo slice 35/69.0]
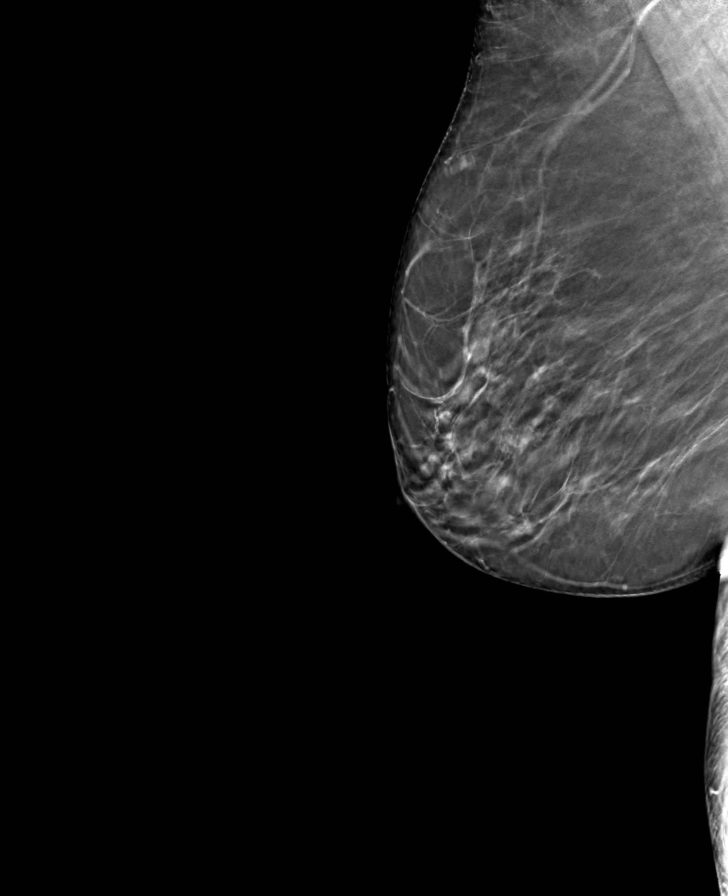

[L CC tomo · tomo slice 40/79.0]
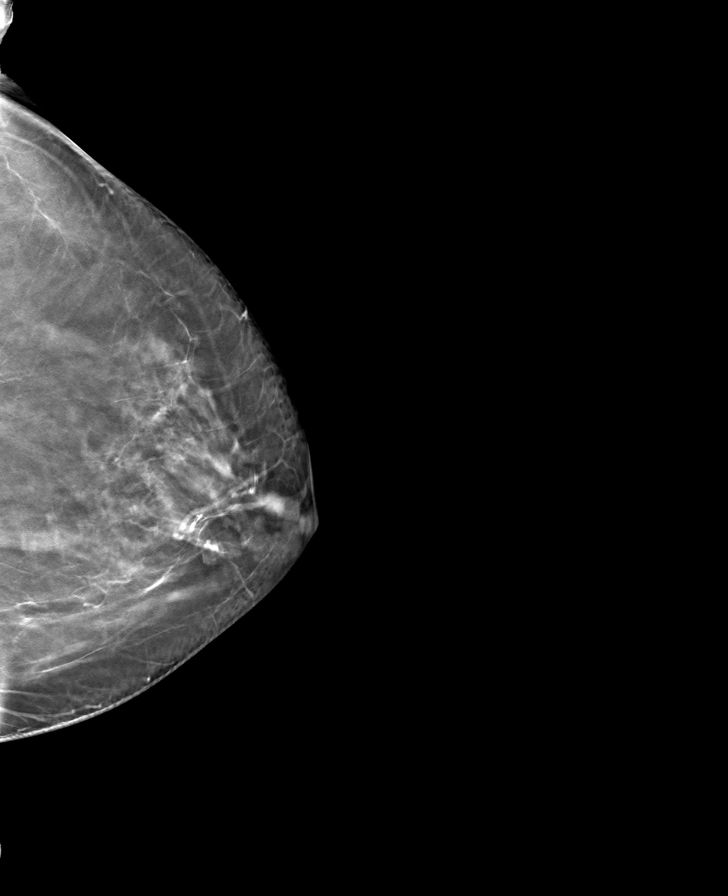

[L MLO tomo · tomo slice 35/70.0]
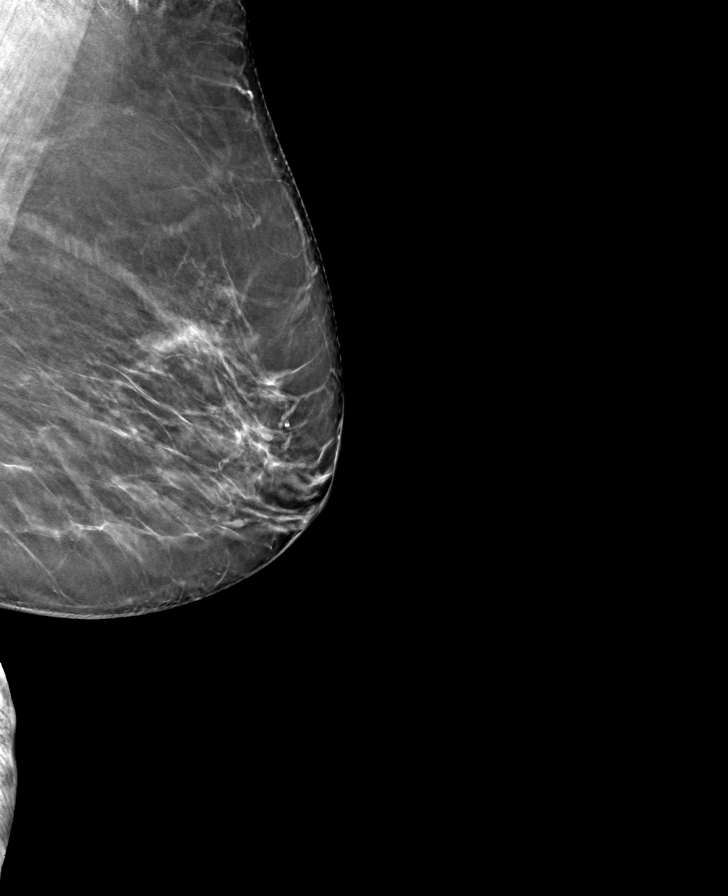

[8 of 24 positions shown; findings below may reference images not displayed]

ACR Breast Density Category b: There are scattered areas of
fibroglandular density.
FINDINGS: In the left breast, possible distortion warrants further evaluation.
In the right breast, no findings suspicious for malignancy.
IMPRESSION: Further evaluation is suggested for possible distortion in the left
breast.

RECOMMENDATION:
Diagnostic mammogram and possibly ultrasound of the left breast.
(Code:[E4])

The patient will be contacted regarding the findings, and additional
imaging will be scheduled.

BI-RADS CATEGORY  0: Incomplete. Need additional imaging evaluation
and/or prior mammograms for comparison.

## 2020-11-11 ENCOUNTER — Other Ambulatory Visit: Payer: Self-pay | Admitting: Family Medicine

## 2020-11-11 ENCOUNTER — Ambulatory Visit (HOSPITAL_COMMUNITY): Payer: PPO | Attending: Cardiology

## 2020-11-11 ENCOUNTER — Other Ambulatory Visit (HOSPITAL_COMMUNITY): Payer: PPO

## 2020-11-11 DIAGNOSIS — R600 Localized edema: Secondary | ICD-10-CM | POA: Insufficient documentation

## 2020-11-11 DIAGNOSIS — R928 Other abnormal and inconclusive findings on diagnostic imaging of breast: Secondary | ICD-10-CM

## 2020-11-11 LAB — ECHOCARDIOGRAM COMPLETE
Area-P 1/2: 4.89 cm2
S' Lateral: 2.9 cm

## 2020-11-12 ENCOUNTER — Telehealth: Payer: Self-pay

## 2020-11-12 DIAGNOSIS — R600 Localized edema: Secondary | ICD-10-CM

## 2020-11-12 NOTE — Telephone Encounter (Signed)
The patient has been notified of the result and verbalized understanding.  All questions (if any) were answered. Antonieta Iba, RN 11/12/2020 1:13 PM   Patient will come in next week for lab work.

## 2020-11-12 NOTE — Telephone Encounter (Signed)
-----   Message from Sueanne Margarita, MD sent at 11/11/2020  5:27 PM EDT ----- Echo showed normal heart function with increased stiffness of heart muscle and mildly thickened AV.  Please have her come in for BNP for leg edema

## 2020-11-18 ENCOUNTER — Other Ambulatory Visit: Payer: PPO

## 2020-11-18 ENCOUNTER — Other Ambulatory Visit: Payer: Self-pay

## 2020-11-18 DIAGNOSIS — R600 Localized edema: Secondary | ICD-10-CM | POA: Diagnosis not present

## 2020-11-19 LAB — PRO B NATRIURETIC PEPTIDE: NT-Pro BNP: 151 pg/mL (ref 0–738)

## 2020-12-02 ENCOUNTER — Ambulatory Visit
Admission: RE | Admit: 2020-12-02 | Discharge: 2020-12-02 | Disposition: A | Discharge status: Home / Self Care | Payer: PPO | Source: Ambulatory Visit | Attending: Family Medicine | Admitting: Family Medicine

## 2020-12-02 ENCOUNTER — Other Ambulatory Visit: Payer: Self-pay

## 2020-12-02 ENCOUNTER — Other Ambulatory Visit: Payer: Self-pay | Admitting: Family Medicine

## 2020-12-02 DIAGNOSIS — R928 Other abnormal and inconclusive findings on diagnostic imaging of breast: Secondary | ICD-10-CM

## 2020-12-02 DIAGNOSIS — N6489 Other specified disorders of breast: Secondary | ICD-10-CM

## 2020-12-02 IMAGING — US US BREAST*L* LIMITED INC AXILLA
1 series · 10 of 10 positions shown · non-contrast
Comparison: Previous exam(s).

CLINICAL DATA: Patient recalled from screening for left breast
distortion.

EXAM:
DIGITAL DIAGNOSTIC UNILATERAL LEFT MAMMOGRAM WITH TOMOSYNTHESIS AND
CAD; ULTRASOUND LEFT BREAST LIMITED
TECHNIQUE: Left digital diagnostic mammography and breast tomosynthesis was
performed. The images were evaluated with computer-aided detection.;
Targeted ultrasound examination of the left breast was performed

[Series 1: us breast*left* limited inc axilla · 0.06mm/px · 10 of 10 slices shown]
[im 1/10]
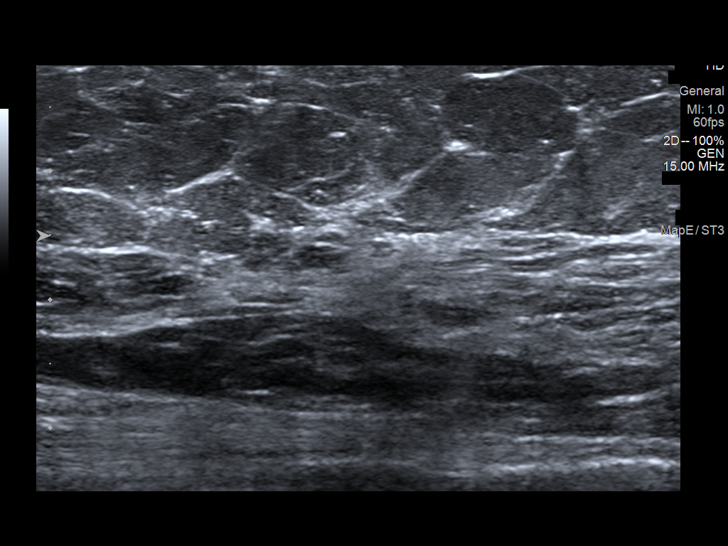
[im 2/10]
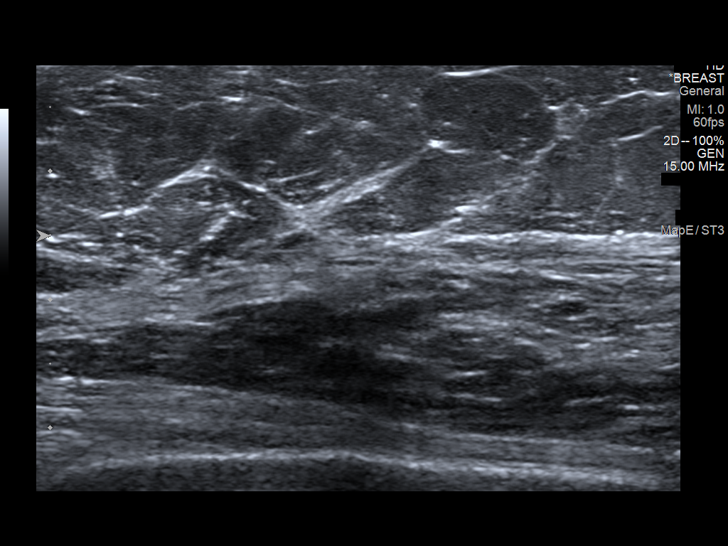
[im 3/10]
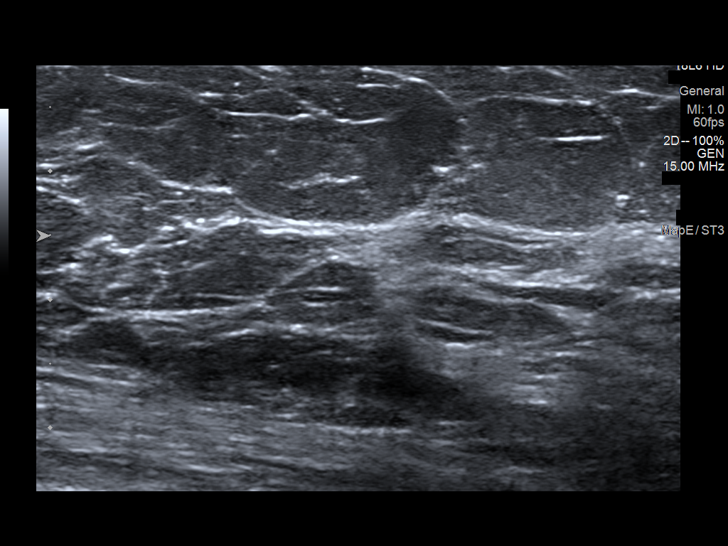
[im 4/10]
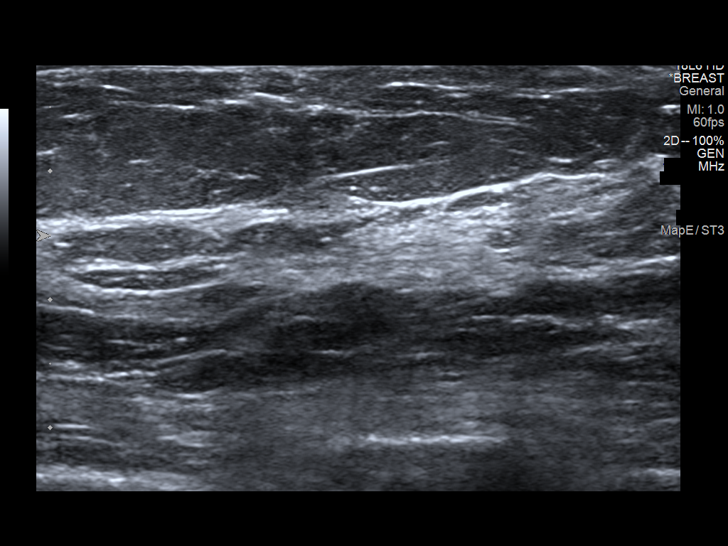
[im 5/10]
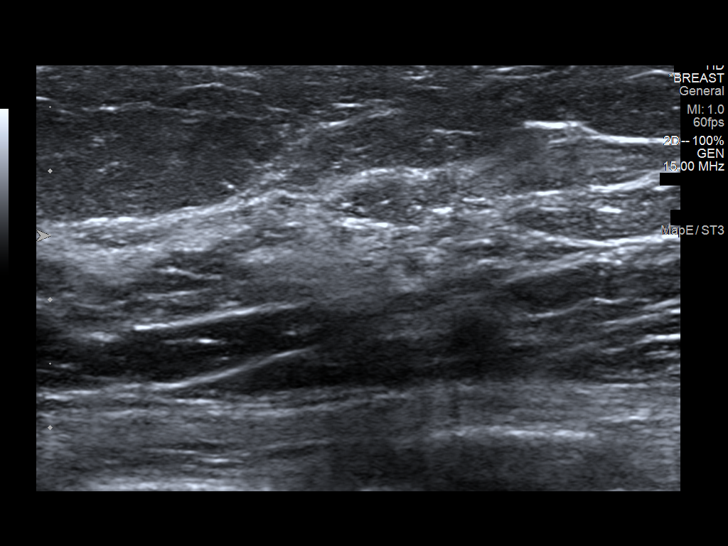
[im 6/10]
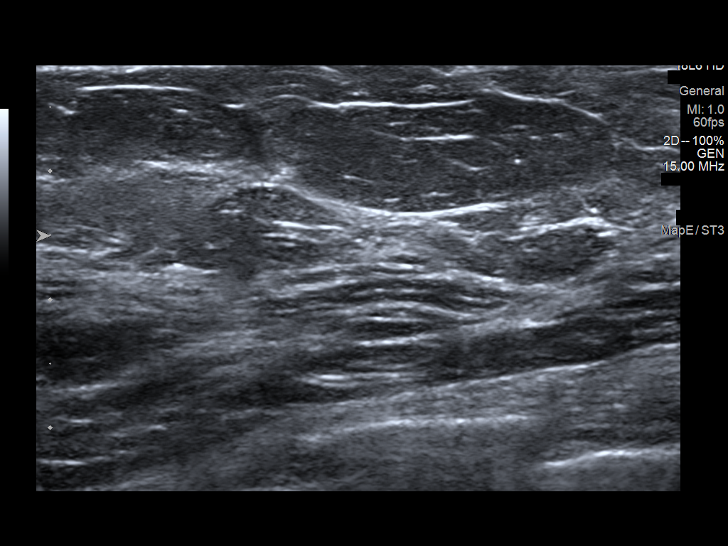
[im 7/10]
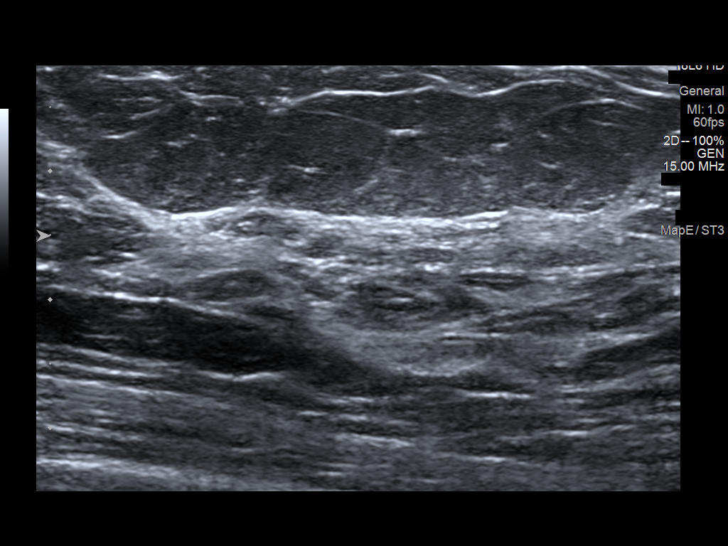
[im 8/10]
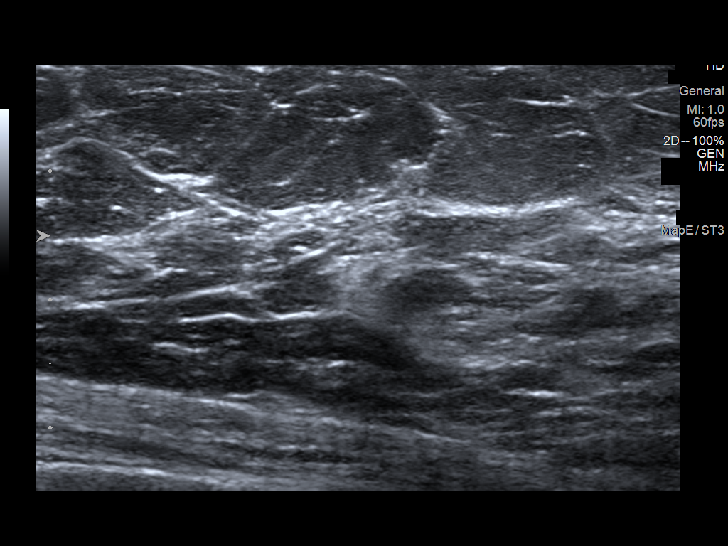
[im 9/10]
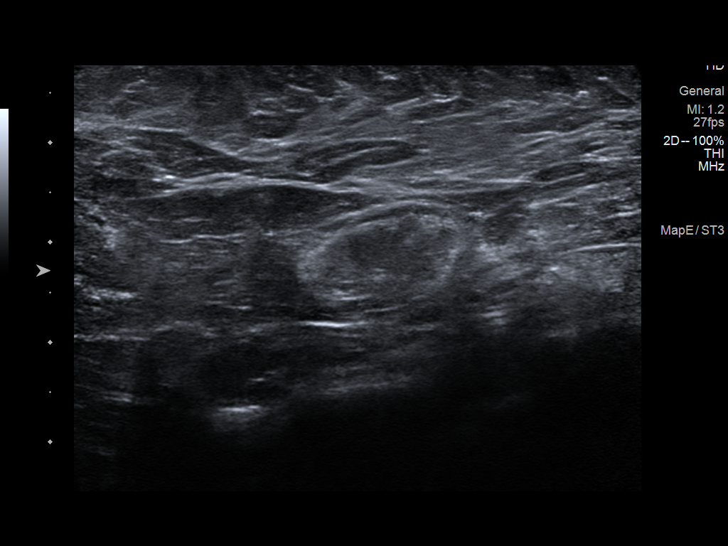
[im 10/10]
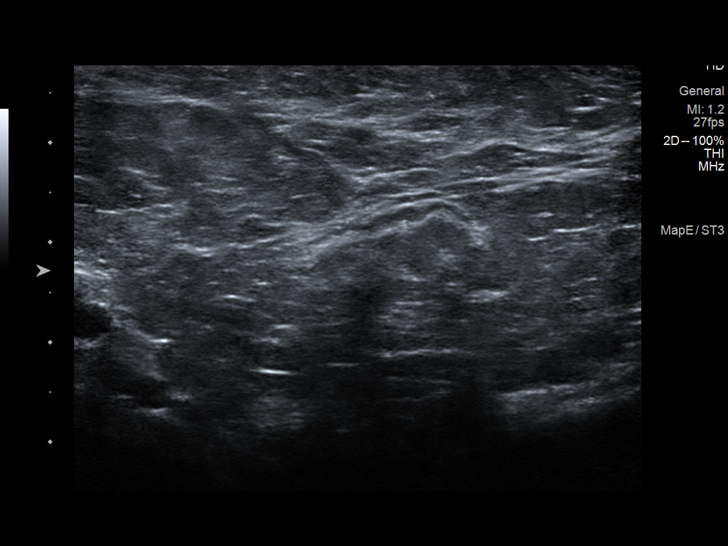

[10 of 10 positions shown; findings below may reference images not displayed]

ACR Breast Density Category c: The breast tissue is heterogeneously
dense, which may obscure small masses.
FINDINGS: Additional imaging demonstrates persistent subtle distortion within
the upper-outer left breast.

Targeted ultrasound is performed, showing no definite sonographic
correlate for left breast subtle distortion. No left axillary
adenopathy.
IMPRESSION: Persistent subtle distortion upper-outer left breast without
sonographic correlate.

RECOMMENDATION:
Stereotactic guided core needle biopsy left breast distortion.

I have discussed the findings and recommendations with the patient.
If applicable, a reminder letter will be sent to the patient
regarding the next appointment.

BI-RADS CATEGORY  4: Suspicious.

## 2020-12-02 IMAGING — MG MM DIGITAL DIAGNOSTIC UNILAT*L* W/ TOMO W/ CAD
6 series · 6 of 18 positions shown · non-contrast
Comparison: Previous exam(s).

CLINICAL DATA: Patient recalled from screening for left breast
distortion.

EXAM:
DIGITAL DIAGNOSTIC UNILATERAL LEFT MAMMOGRAM WITH TOMOSYNTHESIS AND
CAD; ULTRASOUND LEFT BREAST LIMITED
TECHNIQUE: Left digital diagnostic mammography and breast tomosynthesis was
performed. The images were evaluated with computer-aided detection.;
Targeted ultrasound examination of the left breast was performed

[L MLO synth-2D]
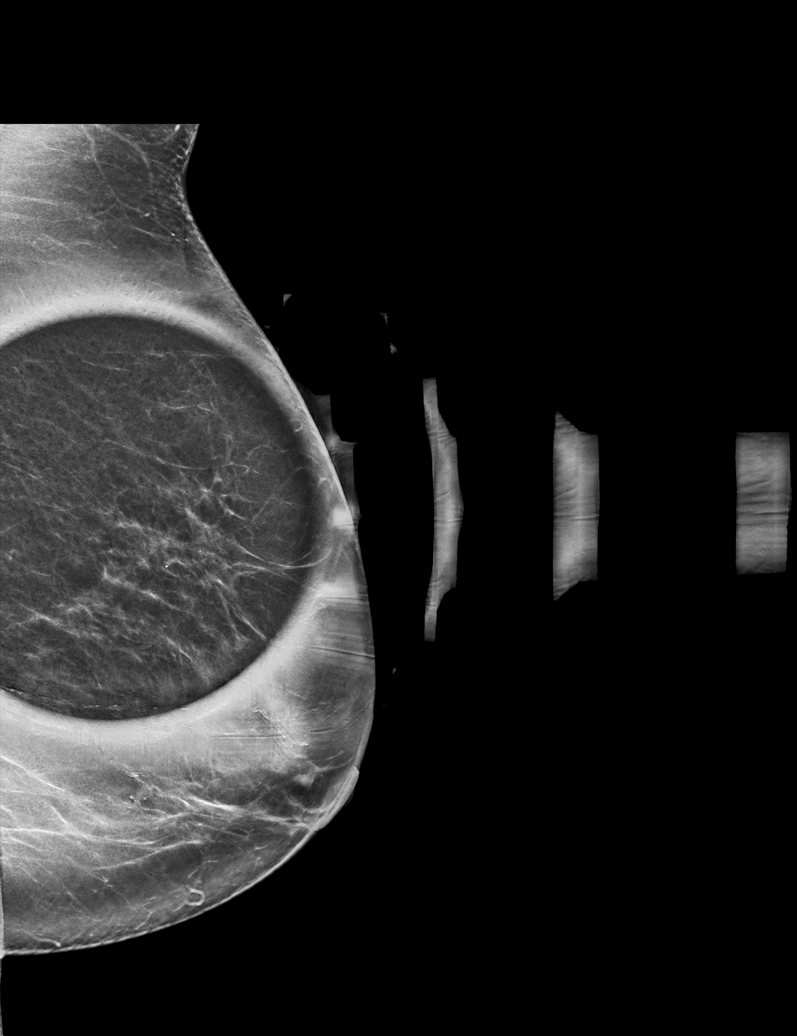

[L ML synth-2D]
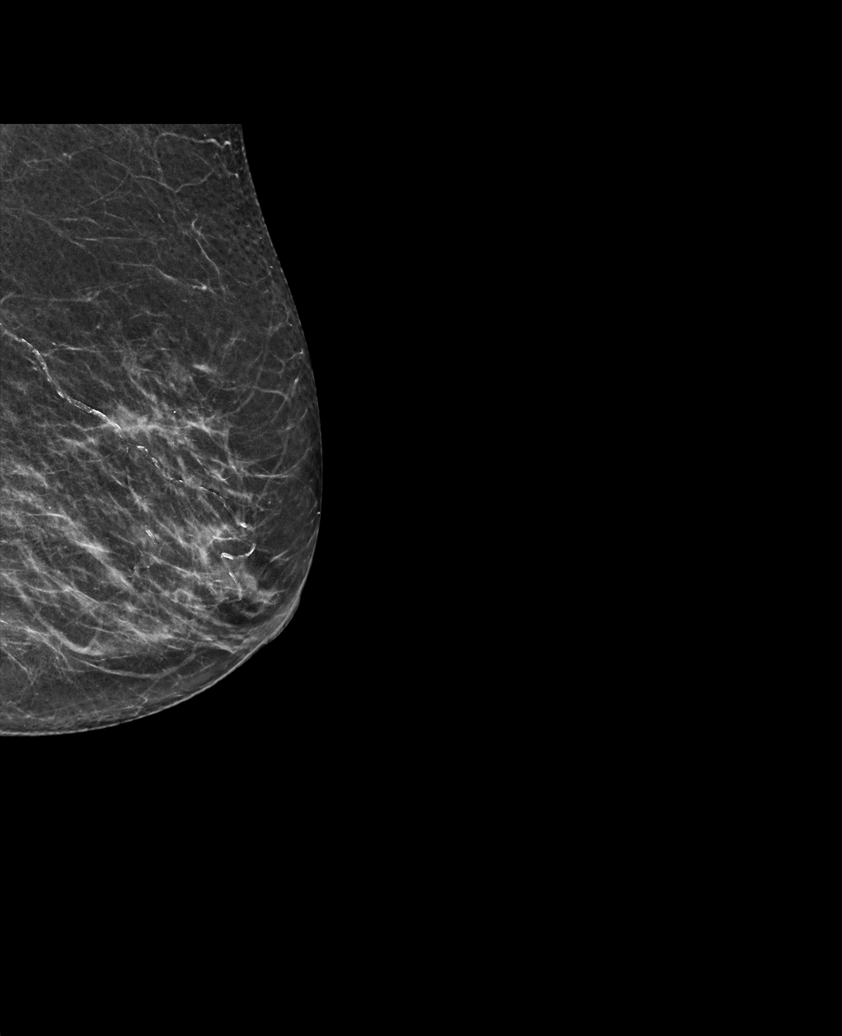

[L CC synth-2D]
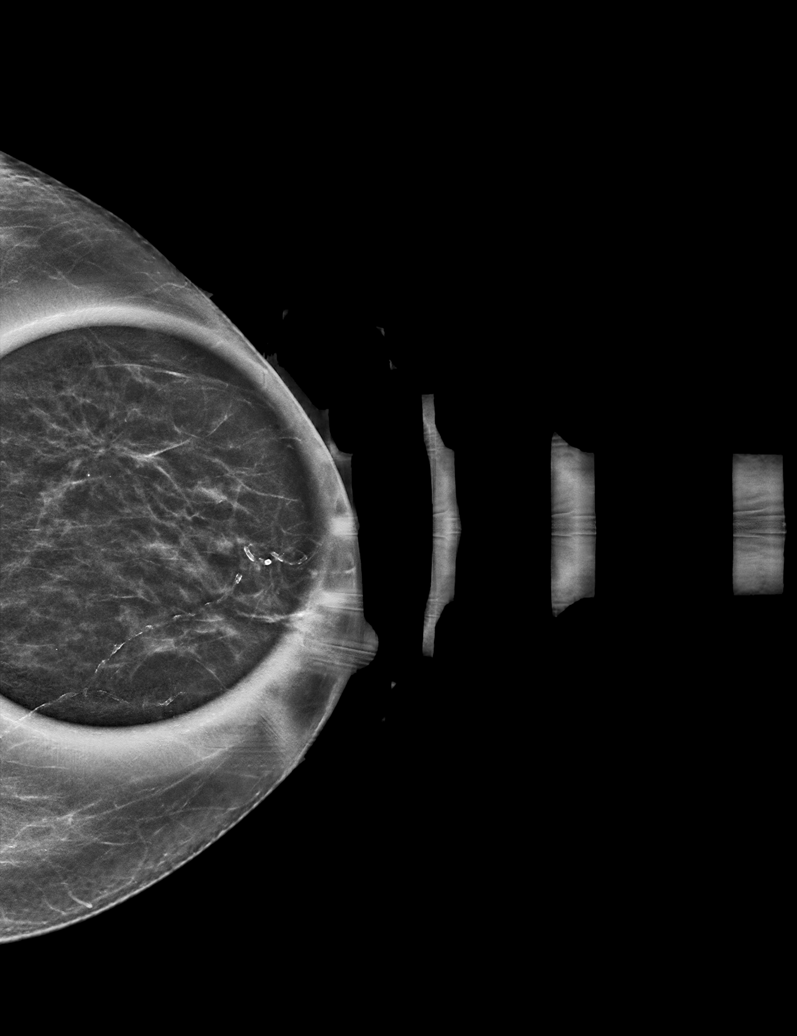

[L MLO tomo · tomo slice 28/55.0]
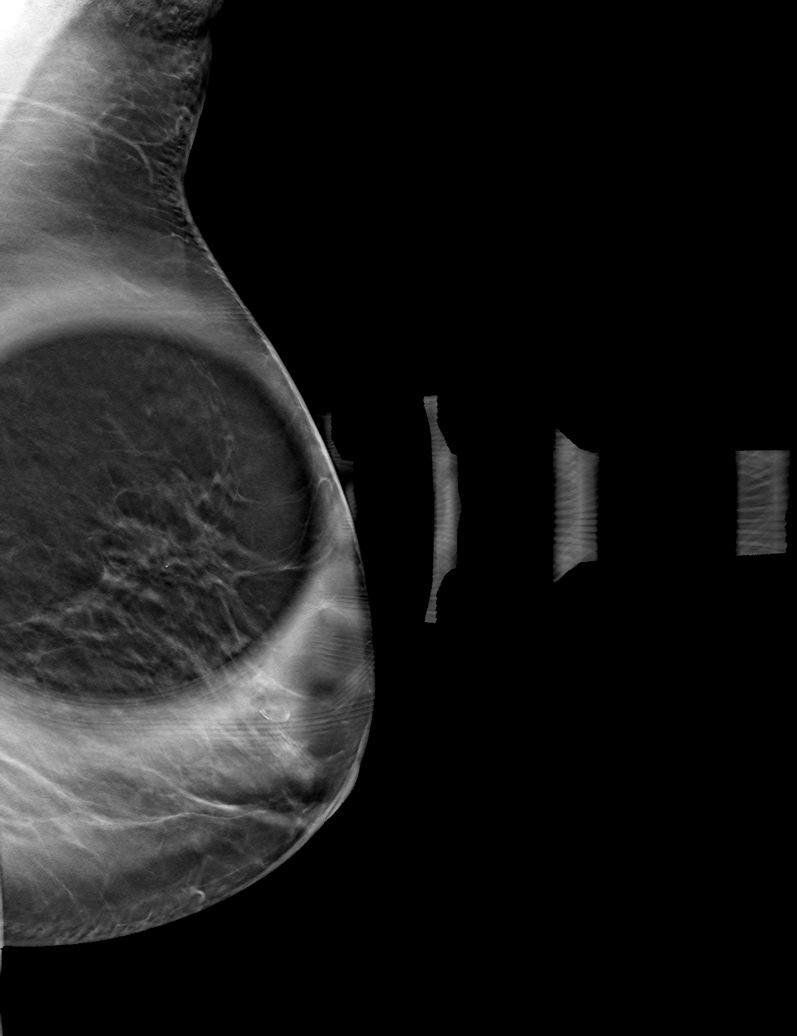

[L ML tomo · tomo slice 30/59.0]
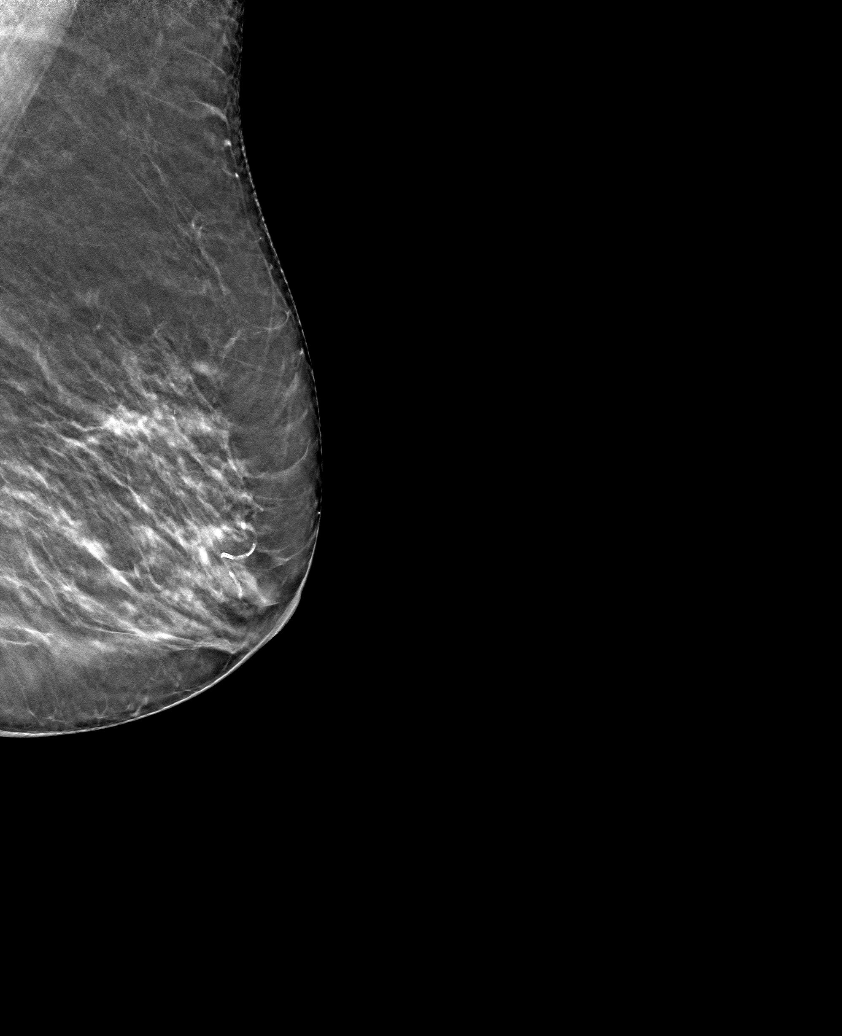

[L CC tomo · tomo slice 27/52.0]
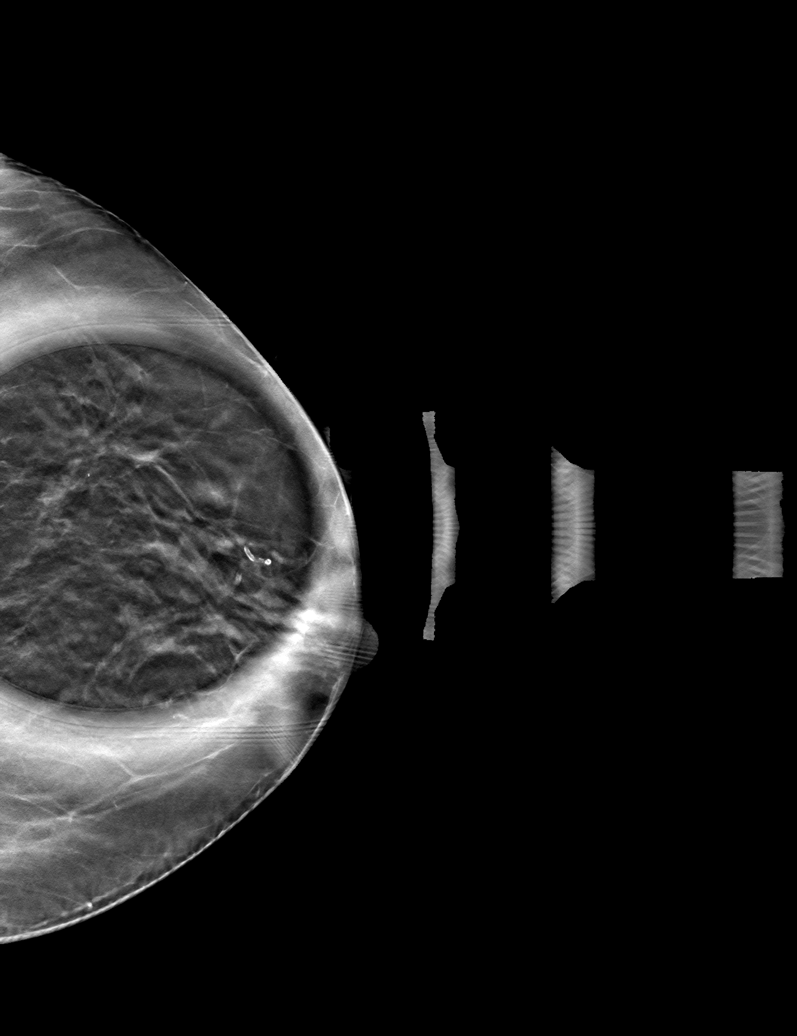

[6 of 18 positions shown; findings below may reference images not displayed]

ACR Breast Density Category c: The breast tissue is heterogeneously
dense, which may obscure small masses.
FINDINGS: Additional imaging demonstrates persistent subtle distortion within
the upper-outer left breast.

Targeted ultrasound is performed, showing no definite sonographic
correlate for left breast subtle distortion. No left axillary
adenopathy.
IMPRESSION: Persistent subtle distortion upper-outer left breast without
sonographic correlate.

RECOMMENDATION:
Stereotactic guided core needle biopsy left breast distortion.

I have discussed the findings and recommendations with the patient.
If applicable, a reminder letter will be sent to the patient
regarding the next appointment.

BI-RADS CATEGORY  4: Suspicious.

## 2020-12-05 ENCOUNTER — Ambulatory Visit
Admission: RE | Admit: 2020-12-05 | Discharge: 2020-12-05 | Disposition: A | Discharge status: Home / Self Care | Payer: PPO | Source: Ambulatory Visit | Attending: Family Medicine | Admitting: Family Medicine

## 2020-12-05 ENCOUNTER — Other Ambulatory Visit: Payer: Self-pay

## 2020-12-05 DIAGNOSIS — N6489 Other specified disorders of breast: Secondary | ICD-10-CM

## 2020-12-05 DIAGNOSIS — R928 Other abnormal and inconclusive findings on diagnostic imaging of breast: Secondary | ICD-10-CM | POA: Diagnosis not present

## 2020-12-05 IMAGING — MG MM BREAST BX W LOC DEV 1ST LESION IMAGE BX SPEC STEREO GUIDE*L*
8 of 14 series · 8 of 22 positions shown · non-contrast
Comparison: Previous exams.
COMPARISON: Previous exams.

Addendum:
CLINICAL DATA: 86-year-old female for tissue sampling of
UPPER-OUTER LEFT breast distortion.

EXAM:
LEFT BREAST STEREOTACTIC CORE NEEDLE BIOPSY

[L (1 of 8)]
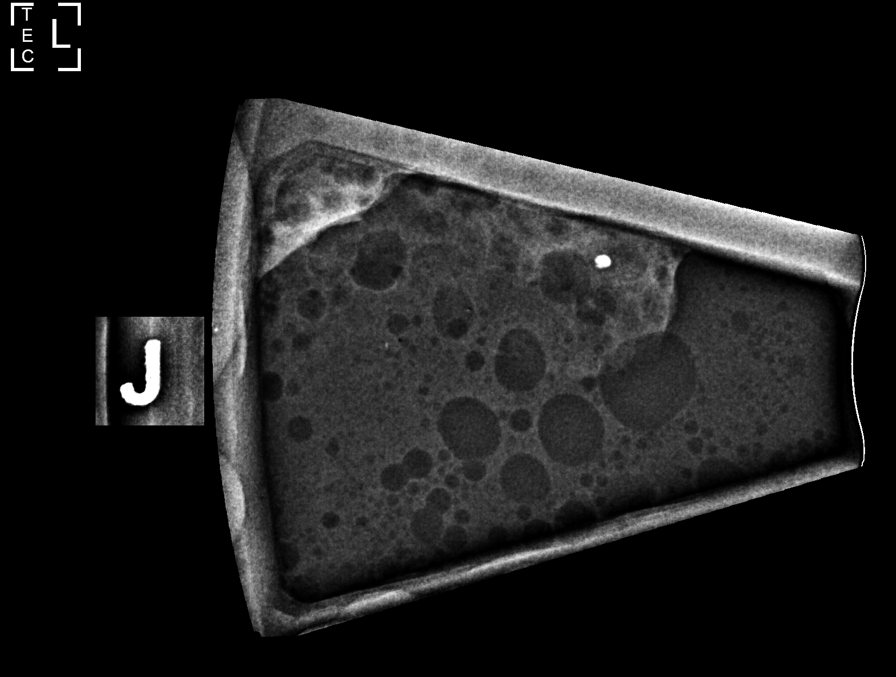

[L (2 of 8)]
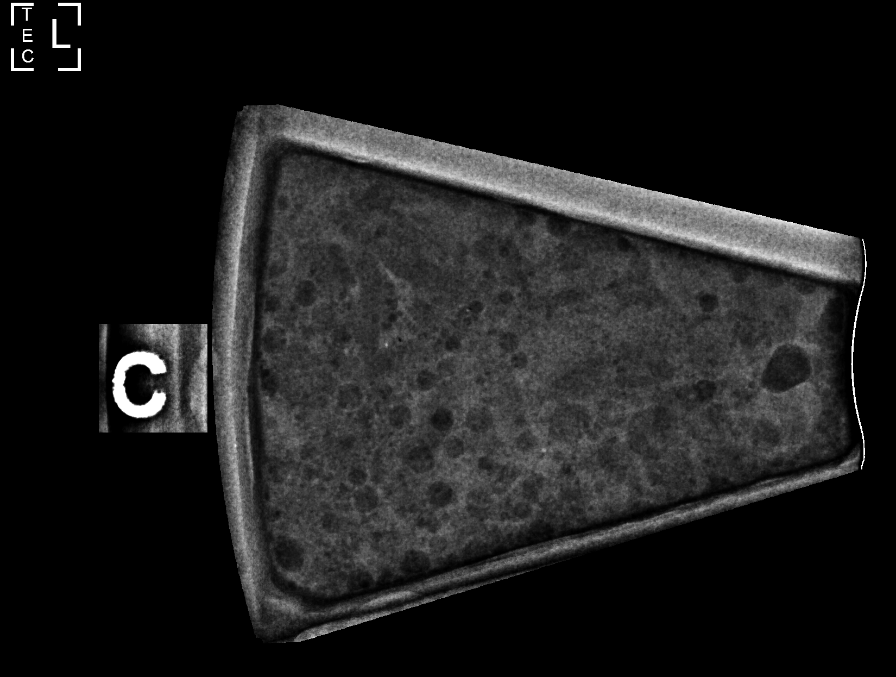

[L (3 of 8)]
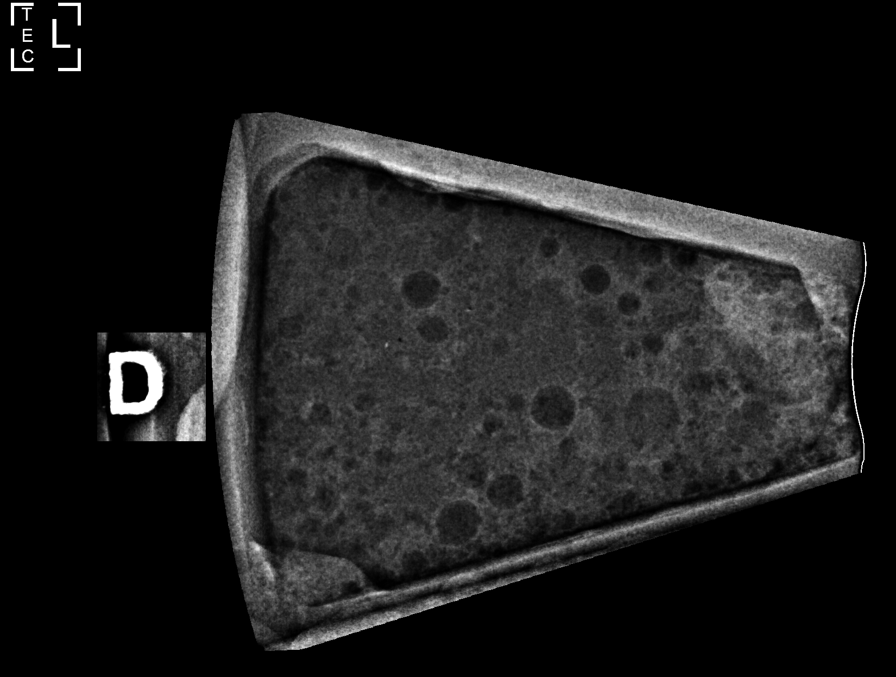

[L (4 of 8)]
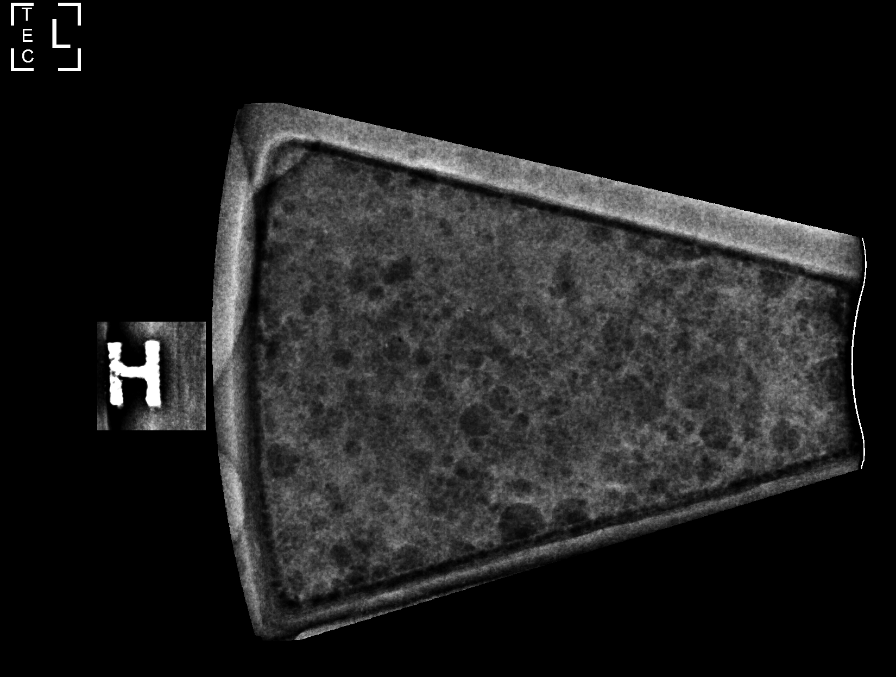

[L (5 of 8)]
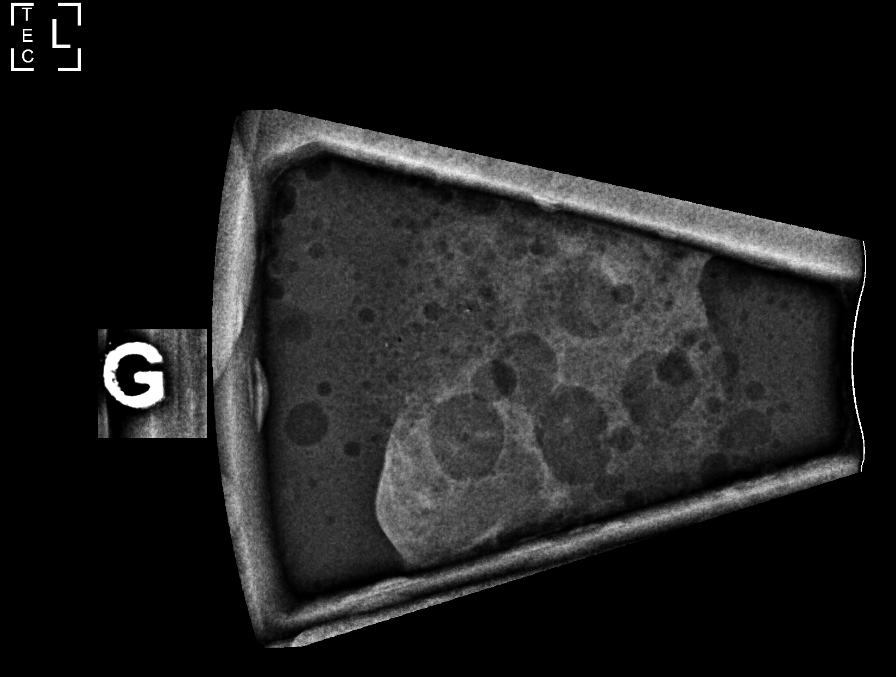

[L (6 of 8)]
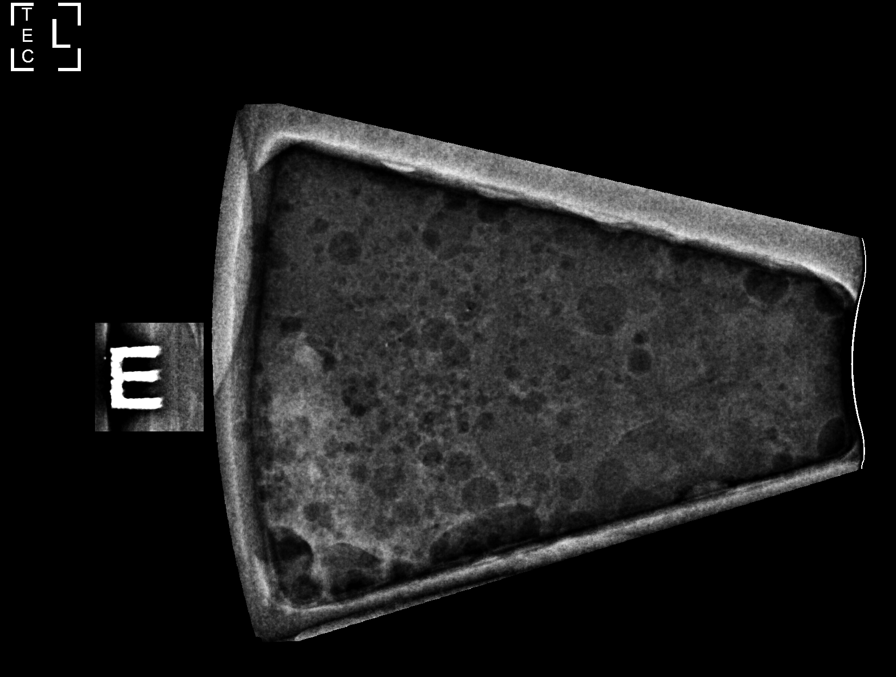

[L (7 of 8)]
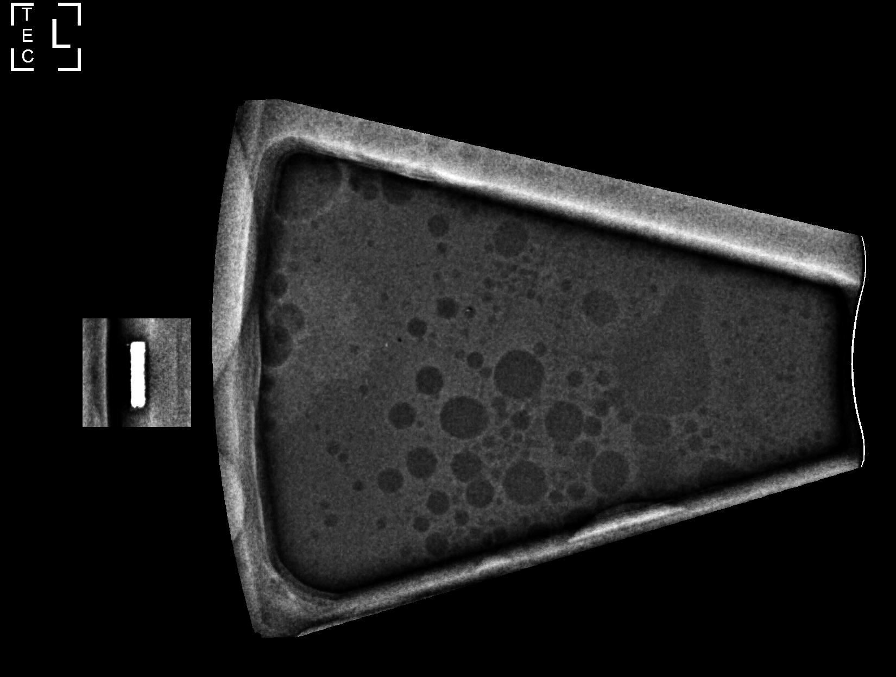

[L (8 of 8)]
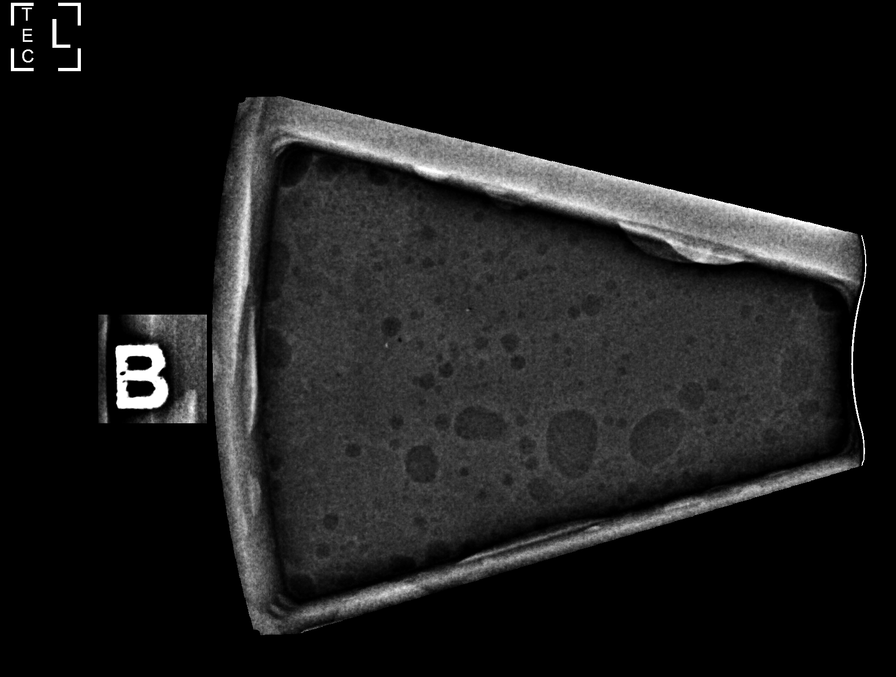

[8 of 22 positions shown; findings below may reference images not displayed]



Using sterile technique and 1% Lidocaine as local anesthetic, under
stereotactic guidance, a 9 gauge vacuum assisted device was used to
perform core needle biopsy of UPPER-OUTER LEFT breast distortion
using a SUPERIOR approach. Specimen radiograph was performed.

Lesion quadrant: UPPER-OUTER LEFT breast

At the conclusion of the procedure, an X tissue marker clip was
deployed into the biopsy cavity. Follow-up 2-view mammogram was
performed and dictated separately.
IMPRESSION: Stereotactic-guided biopsy of UPPER OUTER LEFT breast distortion. No
apparent complications.

ADDENDUM:
Pathology revealed COMPLEX SCLEROSING LESION WITH FOCAL APOCRINE
ATYPIA of the LEFT breast, upper outer. This was found to be
concordant by Dr. FRIED, with surgical consultation for excision
recommended.

Pathology results were discussed with the patient by telephone. The
patient reported doing well after the biopsy with tenderness at the
site. Post biopsy instructions and care were reviewed and questions
were answered. The patient was encouraged to call The [REDACTED]

Surgical consultation has been arranged with Dr. FRIED at
[REDACTED] on [DATE].

Pathology results reported by FRIED RN on [DATE].



Using sterile technique and 1% Lidocaine as local anesthetic, under
stereotactic guidance, a 9 gauge vacuum assisted device was used to
perform core needle biopsy of UPPER-OUTER LEFT breast distortion
using a SUPERIOR approach. Specimen radiograph was performed.

Lesion quadrant: UPPER-OUTER LEFT breast

At the conclusion of the procedure, an X tissue marker clip was
deployed into the biopsy cavity. Follow-up 2-view mammogram was
performed and dictated separately.
IMPRESSION: Stereotactic-guided biopsy of UPPER OUTER LEFT breast distortion. No
apparent complications.

## 2020-12-05 IMAGING — MG MM BREAST LOCALIZATION CLIP
4 series · 4 of 12 positions shown · non-contrast
Comparison: Previous exam(s).

CLINICAL DATA: Evaluate X biopsy marker placement following
stereotactic guided LEFT breast biopsy.

EXAM:
DIAGNOSTIC LEFT MAMMOGRAM POST STEREOTACTIC BIOPSY

[L CC synth-2D]
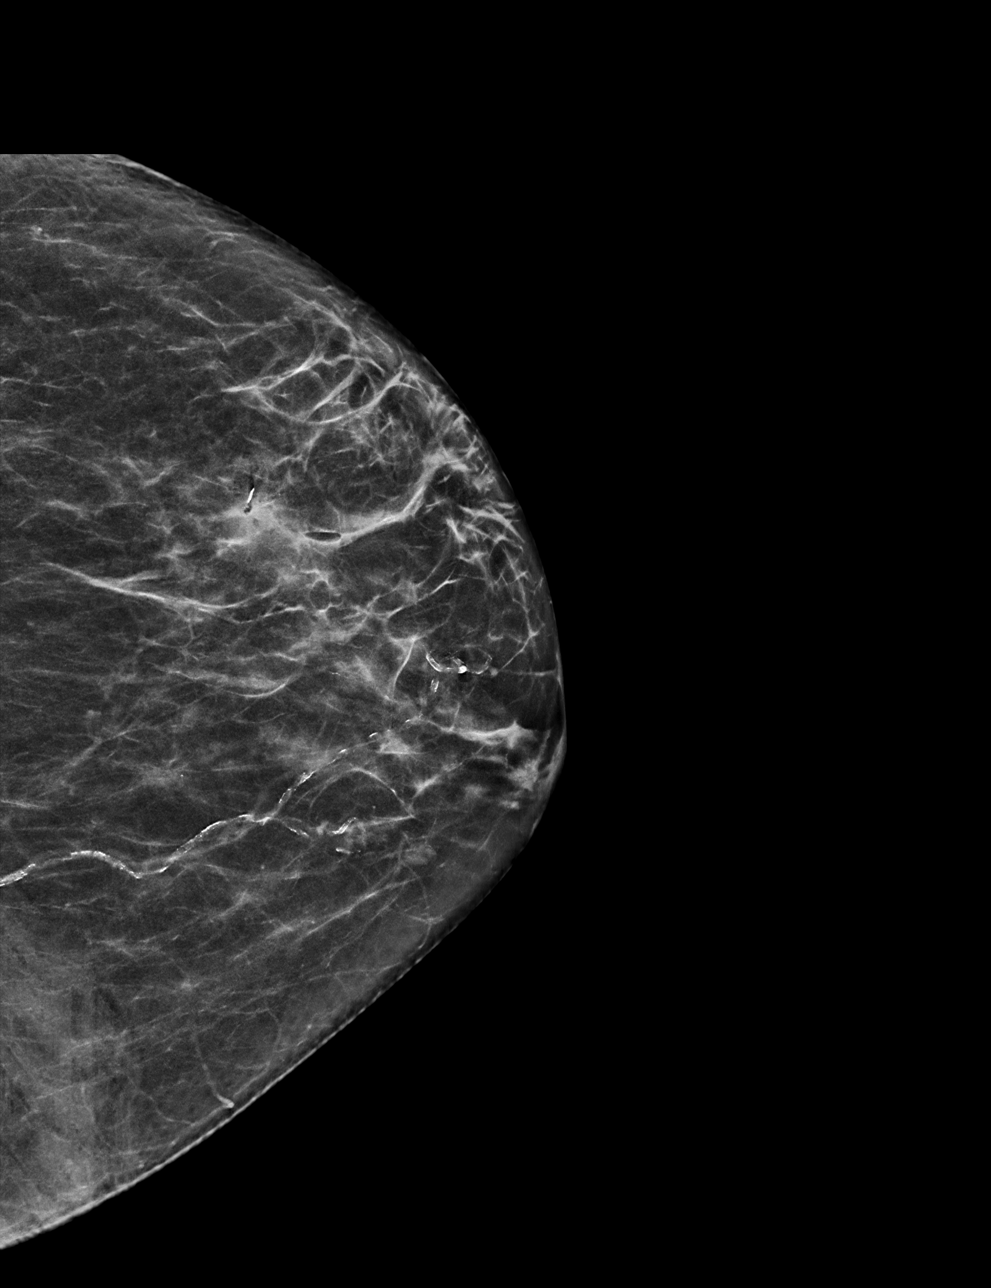

[L ML synth-2D]
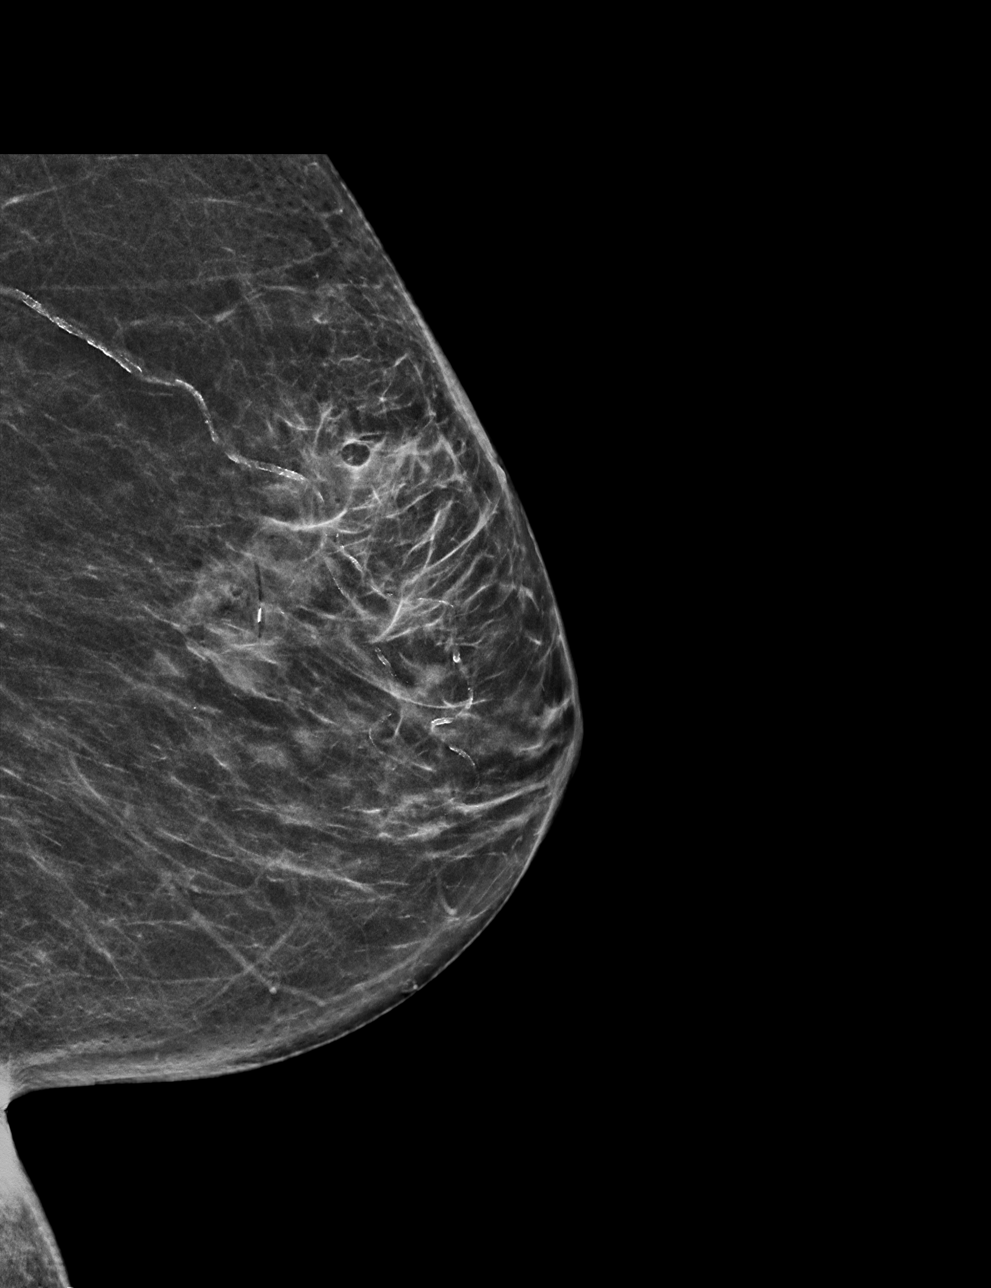

[L CC tomo · tomo slice 29/58.0]
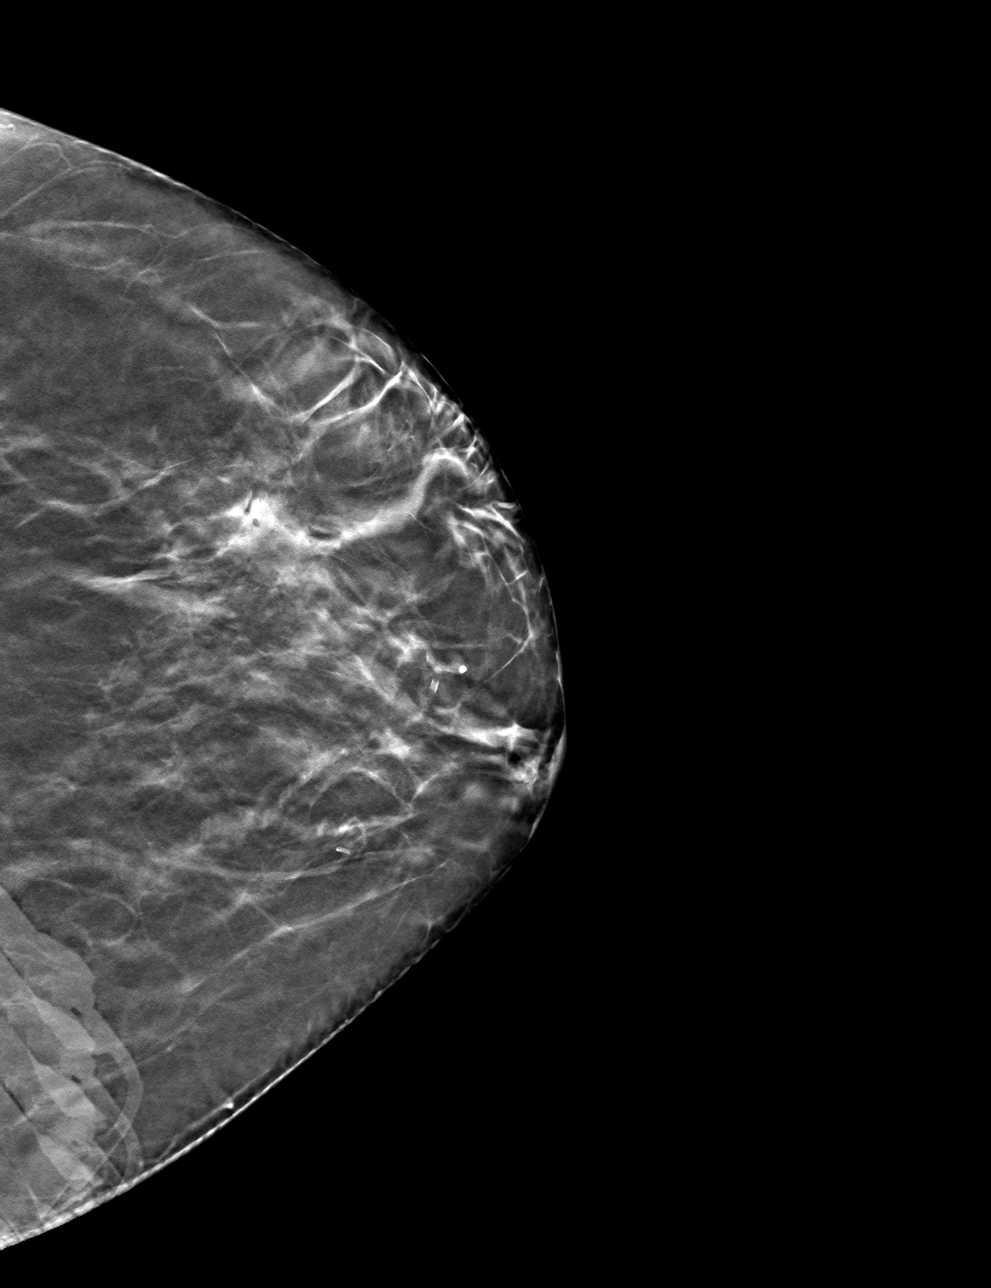

[L ML tomo · tomo slice 30/59.0]
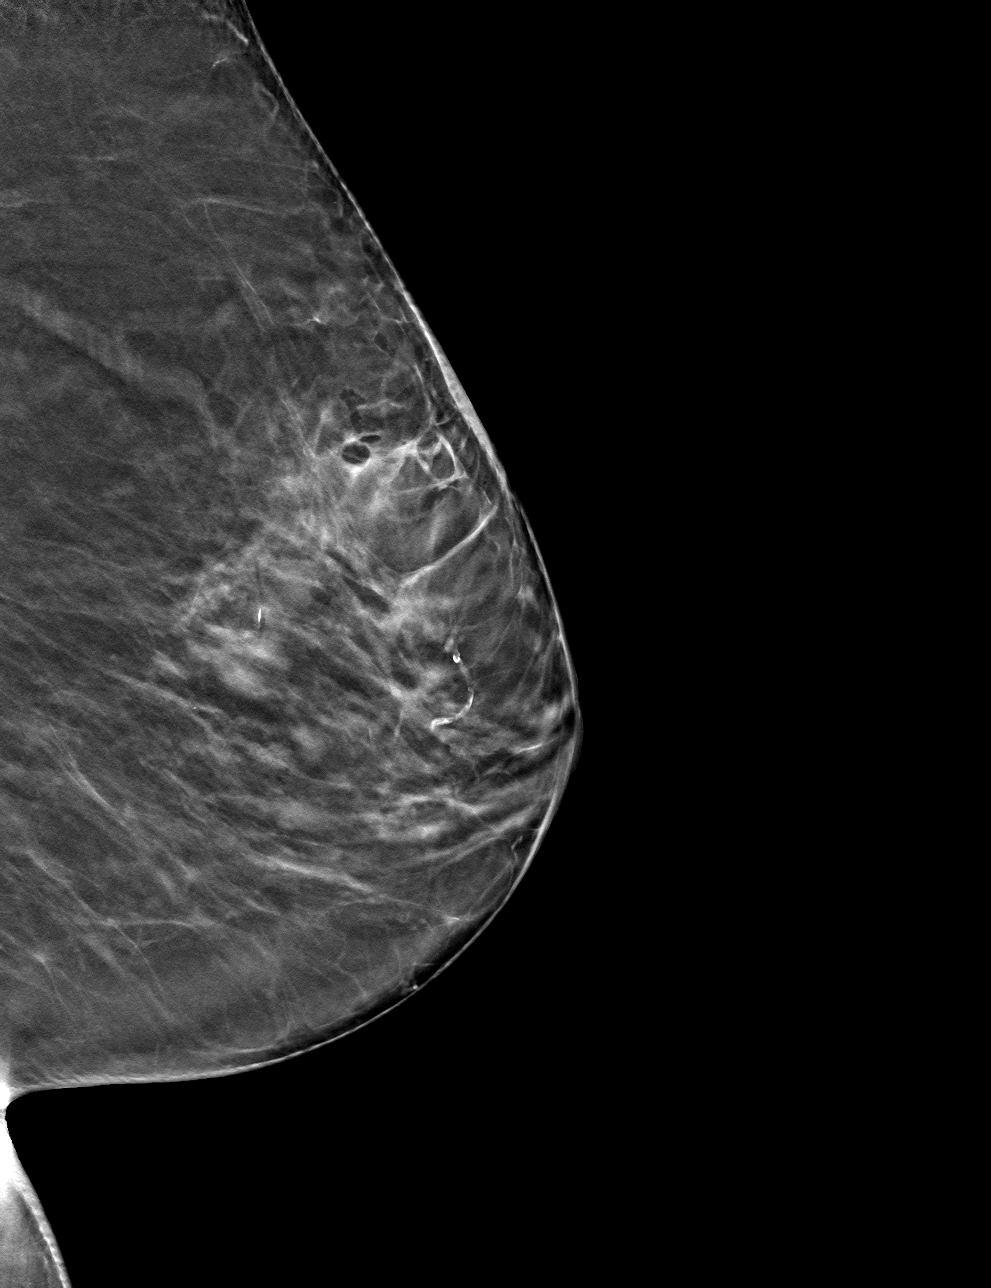

[4 of 12 positions shown; findings below may reference images not displayed]

FINDINGS: Mammographic images were obtained following 3D/stereotactic guided
biopsy of UPPER OUTER LEFT breast distortion. The X biopsy marking
clip is in expected position at the site of biopsy.
IMPRESSION: Appropriate positioning of the X shaped biopsy marking clip at the
site of biopsy in the UPPER OUTER LEFT breast.

Final Assessment: Post Procedure Mammograms for Marker Placement

## 2021-01-16 ENCOUNTER — Ambulatory Visit: Payer: Self-pay | Admitting: Surgery

## 2021-01-16 DIAGNOSIS — N6489 Other specified disorders of breast: Secondary | ICD-10-CM | POA: Diagnosis not present

## 2021-01-27 DIAGNOSIS — U071 COVID-19: Secondary | ICD-10-CM | POA: Diagnosis not present

## 2021-02-03 ENCOUNTER — Emergency Department (HOSPITAL_BASED_OUTPATIENT_CLINIC_OR_DEPARTMENT_OTHER): Payer: PPO

## 2021-02-03 ENCOUNTER — Encounter (HOSPITAL_BASED_OUTPATIENT_CLINIC_OR_DEPARTMENT_OTHER): Payer: Self-pay | Admitting: Urology

## 2021-02-03 ENCOUNTER — Inpatient Hospital Stay (HOSPITAL_BASED_OUTPATIENT_CLINIC_OR_DEPARTMENT_OTHER)
Admission: EM | Admit: 2021-02-03 | Discharge: 2021-02-05 | DRG: 091 | Disposition: A | Discharge status: Home / Self Care | Payer: PPO | Attending: Internal Medicine | Admitting: Internal Medicine

## 2021-02-03 ENCOUNTER — Other Ambulatory Visit: Payer: Self-pay

## 2021-02-03 DIAGNOSIS — I10 Essential (primary) hypertension: Secondary | ICD-10-CM | POA: Diagnosis not present

## 2021-02-03 DIAGNOSIS — G08 Intracranial and intraspinal phlebitis and thrombophlebitis: Principal | ICD-10-CM

## 2021-02-03 DIAGNOSIS — Z83438 Family history of other disorder of lipoprotein metabolism and other lipidemia: Secondary | ICD-10-CM

## 2021-02-03 DIAGNOSIS — Z885 Allergy status to narcotic agent status: Secondary | ICD-10-CM | POA: Diagnosis not present

## 2021-02-03 DIAGNOSIS — E785 Hyperlipidemia, unspecified: Secondary | ICD-10-CM | POA: Diagnosis present

## 2021-02-03 DIAGNOSIS — G319 Degenerative disease of nervous system, unspecified: Secondary | ICD-10-CM | POA: Diagnosis not present

## 2021-02-03 DIAGNOSIS — Z9049 Acquired absence of other specified parts of digestive tract: Secondary | ICD-10-CM | POA: Diagnosis not present

## 2021-02-03 DIAGNOSIS — M810 Age-related osteoporosis without current pathological fracture: Secondary | ICD-10-CM | POA: Diagnosis not present

## 2021-02-03 DIAGNOSIS — D6859 Other primary thrombophilia: Secondary | ICD-10-CM | POA: Diagnosis not present

## 2021-02-03 DIAGNOSIS — I676 Nonpyogenic thrombosis of intracranial venous system: Secondary | ICD-10-CM | POA: Diagnosis not present

## 2021-02-03 DIAGNOSIS — Z882 Allergy status to sulfonamides status: Secondary | ICD-10-CM | POA: Diagnosis not present

## 2021-02-03 DIAGNOSIS — Z79899 Other long term (current) drug therapy: Secondary | ICD-10-CM

## 2021-02-03 DIAGNOSIS — I493 Ventricular premature depolarization: Secondary | ICD-10-CM | POA: Diagnosis not present

## 2021-02-03 DIAGNOSIS — K219 Gastro-esophageal reflux disease without esophagitis: Secondary | ICD-10-CM | POA: Diagnosis not present

## 2021-02-03 DIAGNOSIS — U071 COVID-19: Secondary | ICD-10-CM | POA: Clinically undetermined

## 2021-02-03 DIAGNOSIS — R519 Headache, unspecified: Secondary | ICD-10-CM

## 2021-02-03 DIAGNOSIS — I8290 Acute embolism and thrombosis of unspecified vein: Secondary | ICD-10-CM | POA: Diagnosis present

## 2021-02-03 LAB — RESP PANEL BY RT-PCR (FLU A&B, COVID) ARPGX2
Influenza A by PCR: NEGATIVE
Influenza B by PCR: NEGATIVE
SARS Coronavirus 2 by RT PCR: POSITIVE — AB

## 2021-02-03 LAB — CBC WITH DIFFERENTIAL/PLATELET
Abs Immature Granulocytes: 0.03 10*3/uL (ref 0.00–0.07)
Basophils Absolute: 0 10*3/uL (ref 0.0–0.1)
Basophils Relative: 0 %
Eosinophils Absolute: 0.1 10*3/uL (ref 0.0–0.5)
Eosinophils Relative: 1 %
HCT: 44.4 % (ref 36.0–46.0)
Hemoglobin: 15 g/dL (ref 12.0–15.0)
Immature Granulocytes: 0 %
Lymphocytes Relative: 28 %
Lymphs Abs: 2.9 10*3/uL (ref 0.7–4.0)
MCH: 32.8 pg (ref 26.0–34.0)
MCHC: 33.8 g/dL (ref 30.0–36.0)
MCV: 97.2 fL (ref 80.0–100.0)
Monocytes Absolute: 0.7 10*3/uL (ref 0.1–1.0)
Monocytes Relative: 6 %
Neutro Abs: 6.8 10*3/uL (ref 1.7–7.7)
Neutrophils Relative %: 65 %
Platelets: 286 10*3/uL (ref 150–400)
RBC: 4.57 MIL/uL (ref 3.87–5.11)
RDW: 12.9 % (ref 11.5–15.5)
WBC: 10.6 10*3/uL — ABNORMAL HIGH (ref 4.0–10.5)
nRBC: 0 % (ref 0.0–0.2)

## 2021-02-03 LAB — HEPATIC FUNCTION PANEL
ALT: 16 U/L (ref 0–44)
AST: 23 U/L (ref 15–41)
Albumin: 4 g/dL (ref 3.5–5.0)
Alkaline Phosphatase: 44 U/L (ref 38–126)
Bilirubin, Direct: 0.1 mg/dL (ref 0.0–0.2)
Indirect Bilirubin: 0.5 mg/dL (ref 0.3–0.9)
Total Bilirubin: 0.6 mg/dL (ref 0.3–1.2)
Total Protein: 7 g/dL (ref 6.5–8.1)

## 2021-02-03 LAB — BASIC METABOLIC PANEL
Anion gap: 14 (ref 5–15)
BUN: 18 mg/dL (ref 8–23)
CO2: 21 mmol/L — ABNORMAL LOW (ref 22–32)
Calcium: 8.9 mg/dL (ref 8.9–10.3)
Chloride: 103 mmol/L (ref 98–111)
Creatinine, Ser: 0.88 mg/dL (ref 0.44–1.00)
GFR, Estimated: >60 mL/min (ref >60)
Glucose, Bld: 142 mg/dL — ABNORMAL HIGH (ref 70–99)
Potassium: 3.5 mmol/L (ref 3.5–5.1)
Sodium: 138 mmol/L (ref 135–145)

## 2021-02-03 LAB — APTT: aPTT: 26 seconds (ref 24–36)

## 2021-02-03 LAB — PROTIME-INR
INR: 0.9 (ref 0.8–1.2)
Prothrombin Time: 12.1 seconds (ref 11.4–15.2)

## 2021-02-03 IMAGING — CT CT HEAD W/O CM
4 series · 16 of 47 positions shown, 18 images · non-contrast
Comparison: None.

CLINICAL DATA: Pain, headache today.

EXAM:
CT HEAD WITHOUT CONTRAST
TECHNIQUE: Contiguous axial images were obtained from the base of the skull
through the vertex without intravenous contrast.

[Series 2: head wo · axial · 0.42mm/px · z∈[-281,-166]mm · 7 of 31 slices shown, 9 images]
[im 4/31  brain]
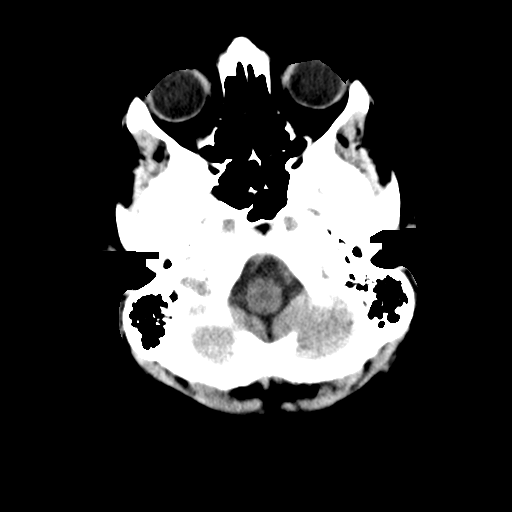
[im 4/31  bone]
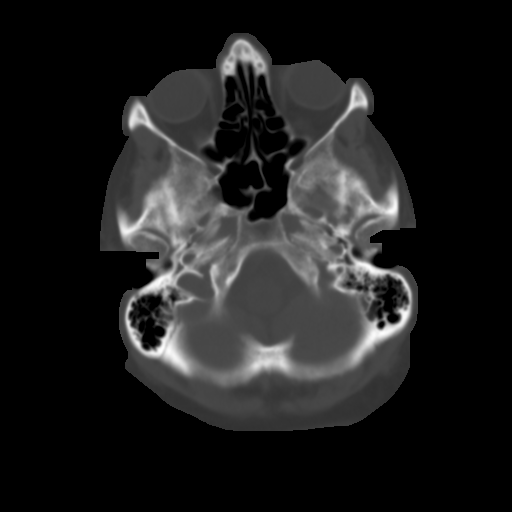
[im 8/31  brain]
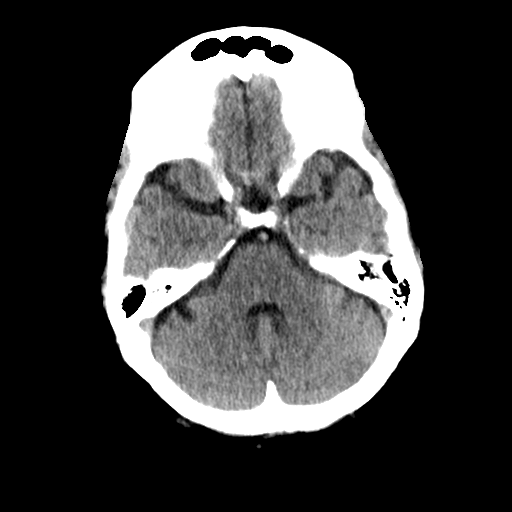
[im 12/31  brain]
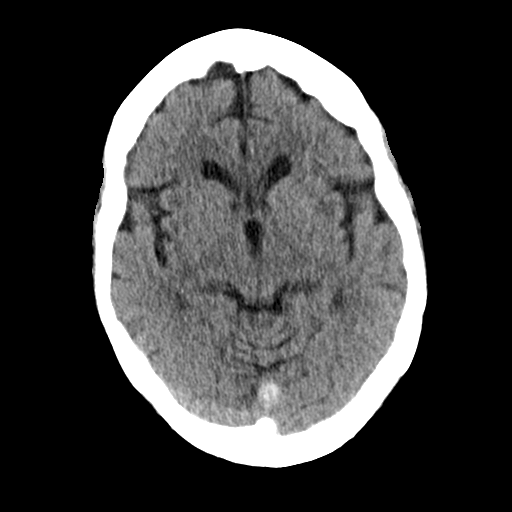
[im 16/31  brain]
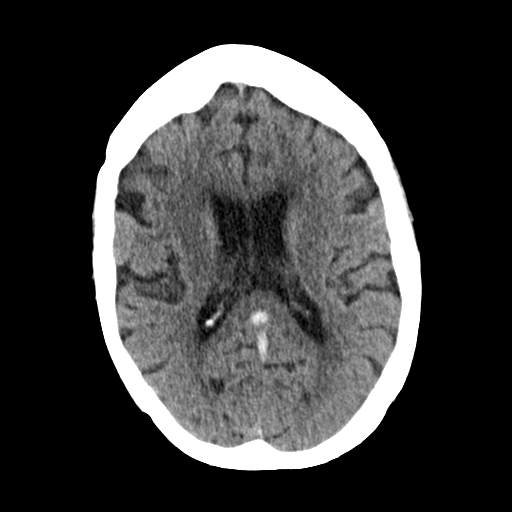
[im 19/31  brain]
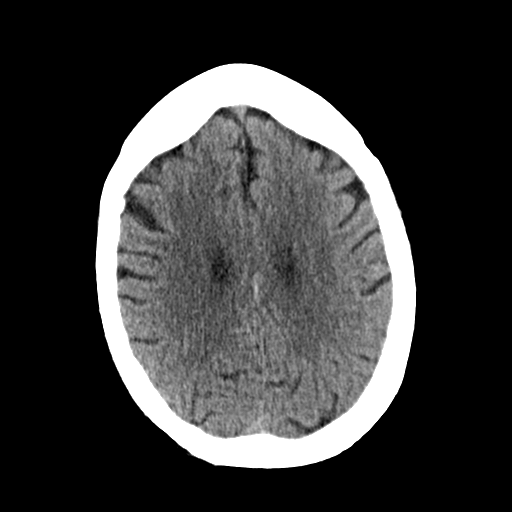
[im 19/31  bone]
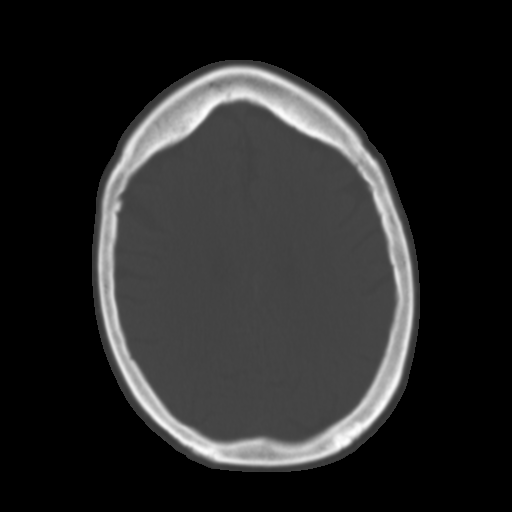
[im 23/31  brain]
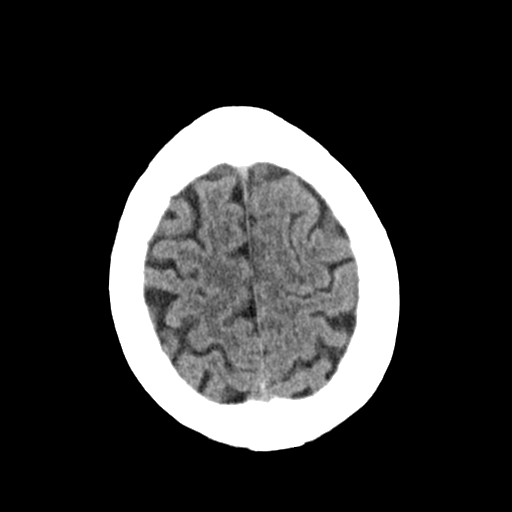
[im 27/31  brain]
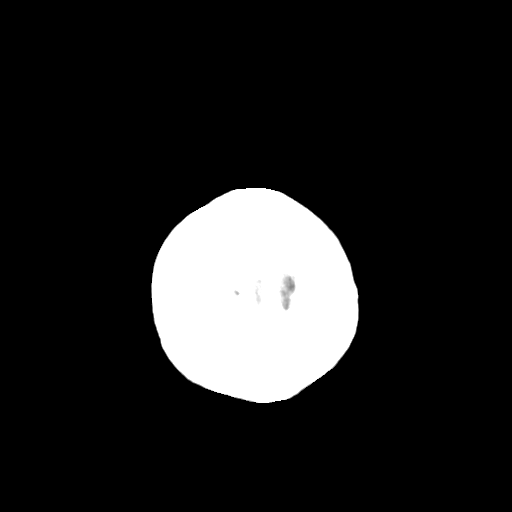

[Series 3: head bone · axial · 0.42mm/px · z∈[-282,-252]mm · 3 of 76 slices shown]
[im 8/76  bone]
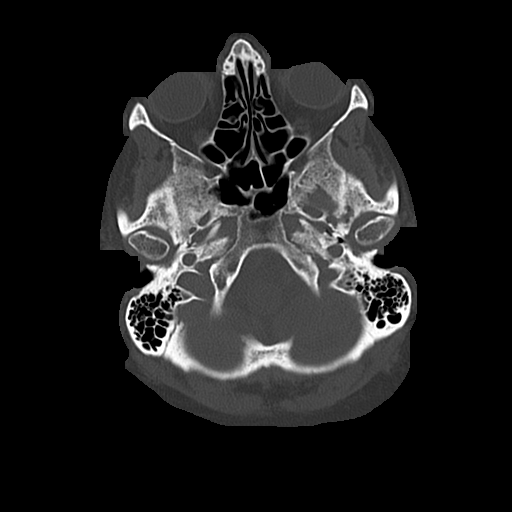
[im 16/76  bone]
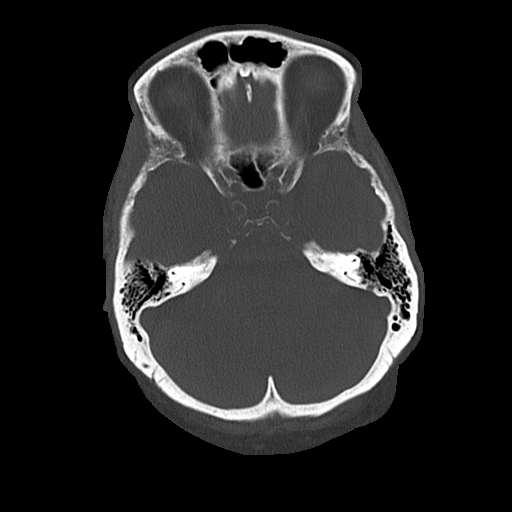
[im 23/76  bone]
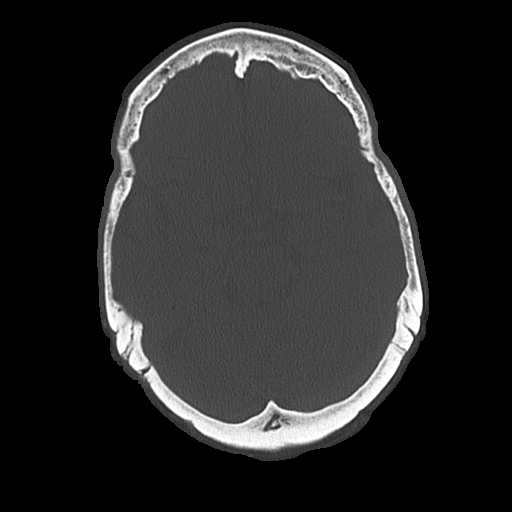

[Series 4: coronal soft · coronal · 0.30mm/px · 3 of 65 slices shown]
[im 22/65  brain]
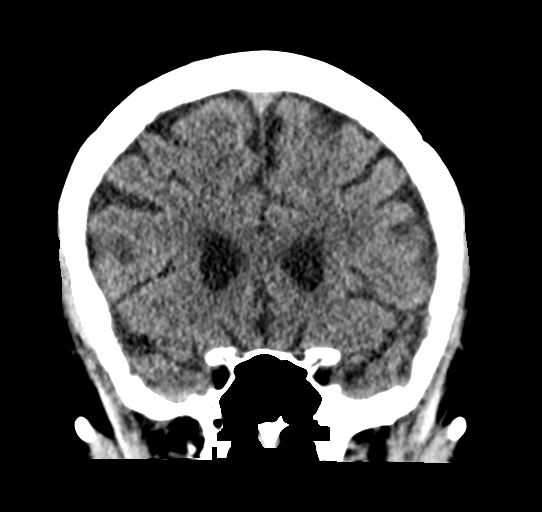
[im 29/65  brain]
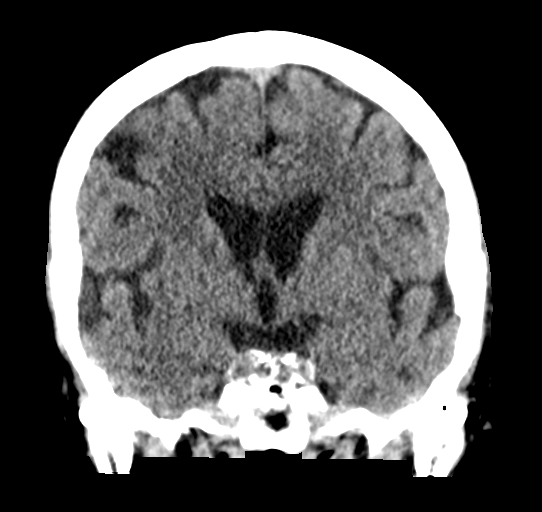
[im 36/65  brain]
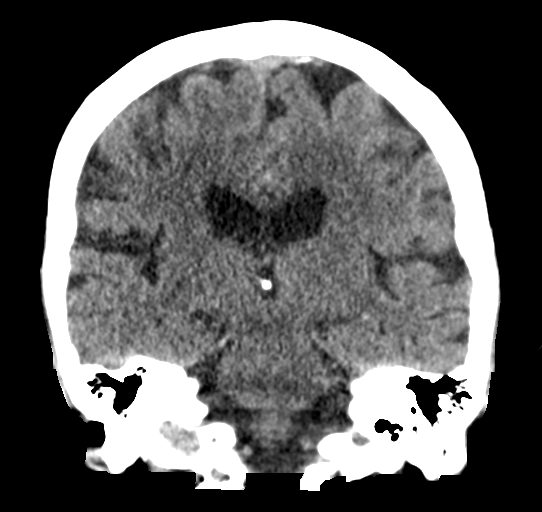

[Series 5: sagittal soft · sagittal · 0.30mm/px · 3 of 56 slices shown]
[im 19/56  brain]
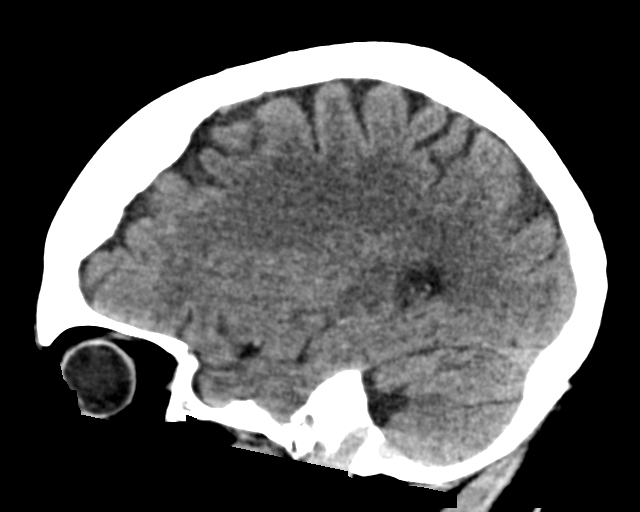
[im 28/56  brain]
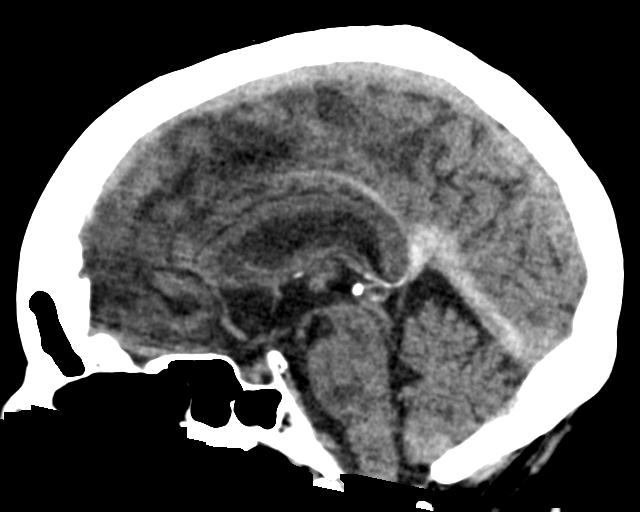
[im 37/56  brain]
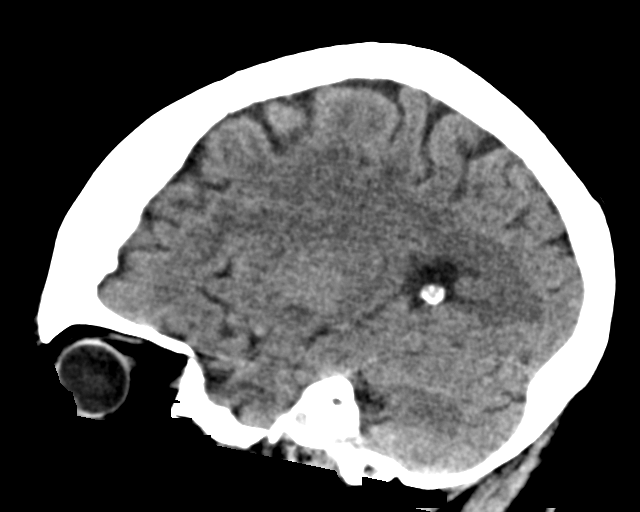

[16 of 47 positions shown; findings below may reference images not displayed]

FINDINGS: Brain: There is thickening and hyperdensity involving the straight
sinus suspicious for thrombus. No definite hemorrhage. Normal brain
volume for age. Minimal periventricular and deep chronic small
vessel ischemia. No evidence of acute infarct. No hydrocephalus. No
midline shift.

Vascular: Thickening and hyperdensity involving the straight sinus
suspicious for thrombus. No evidence of hyperdense MCA. Mild carotid
atherosclerosis at the skull base.

Skull: No fracture or focal lesion.

Sinuses/Orbits: Paranasal sinuses and mastoid air cells are clear.
The visualized orbits are unremarkable. Bilateral cataract resection

Other: None.
IMPRESSION: Thickening and hyperdensity involving the straight sinus suspicious
for thrombus. No visualized venous infarct. Recommend further
evaluation with CTV.

## 2021-02-03 IMAGING — CT CT VENOGRAM HEAD
3 of 7 series · 16 of 47 positions shown · IV contrast (APPLIED)
Comparison: Head CT [DATE]

CLINICAL DATA: Headache with abnormal head CT

EXAM:
CT VENOGRAM HEAD
TECHNIQUE: Venographic phase images of the brain were obtained following the
administration of intravenous contrast. Multiplanar reformats and
maximum intensity projections were generated.
CONTRAST:  75mL OMNIPAQUE IOHEXOL 350 MG/ML SOLN

[Series 4: ax thin · axial · 0.33mm/px · z∈[-301,-149]mm · 10 of 193 slices shown]
[im 11/193  brain]
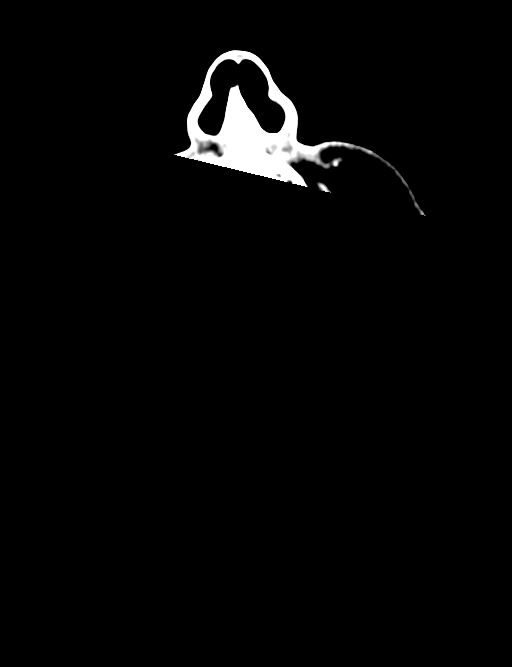
[im 33/193  bone]
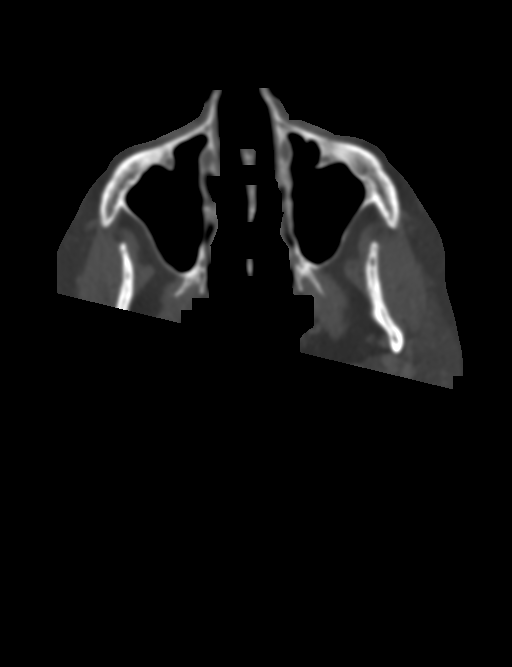
[im 54/193  brain]
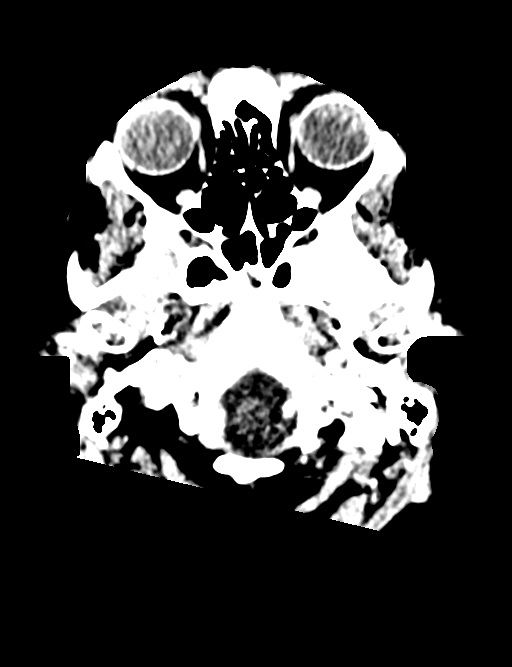
[im 65/193  bone]
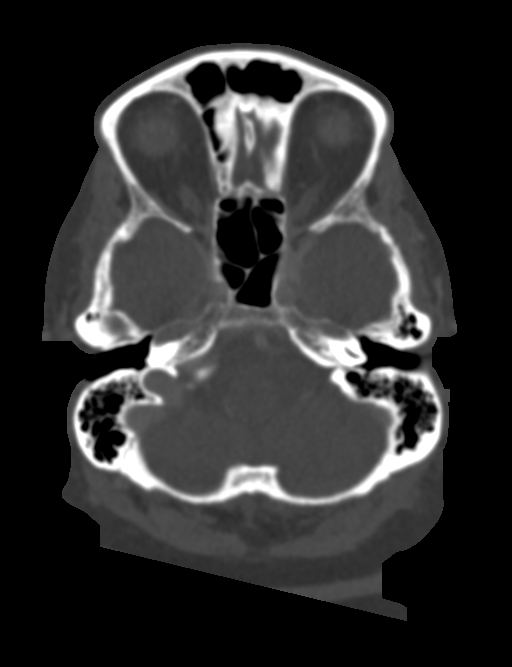
[im 86/193  brain]
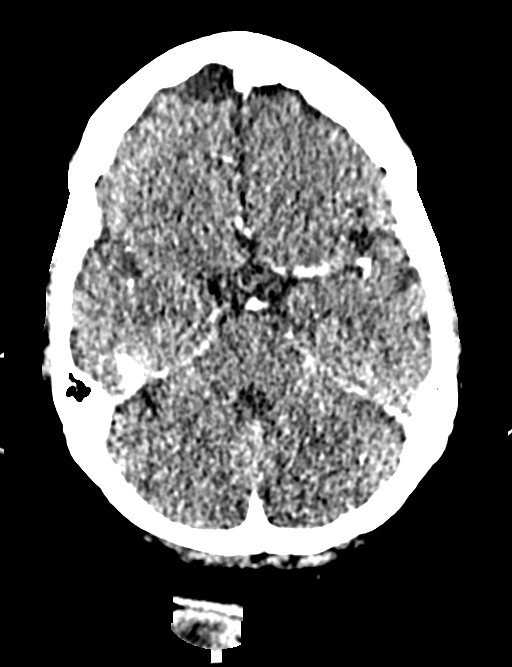
[im 107/193  bone]
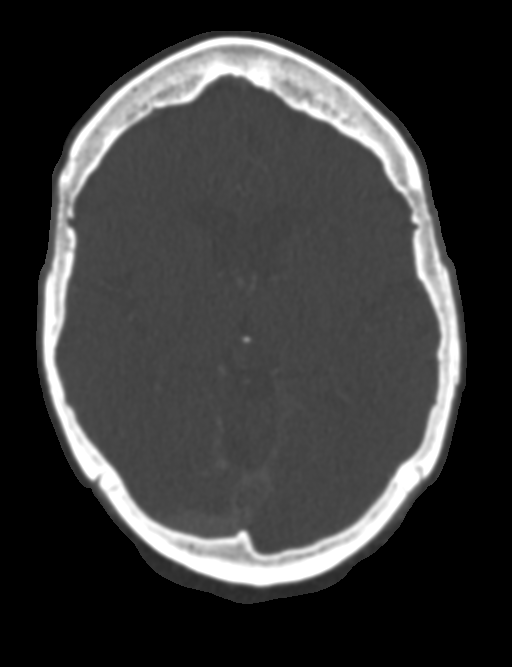
[im 129/193  brain]
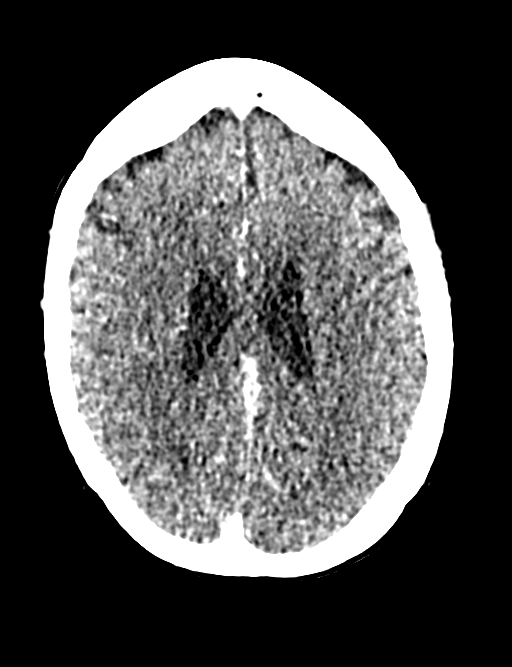
[im 139/193  bone]
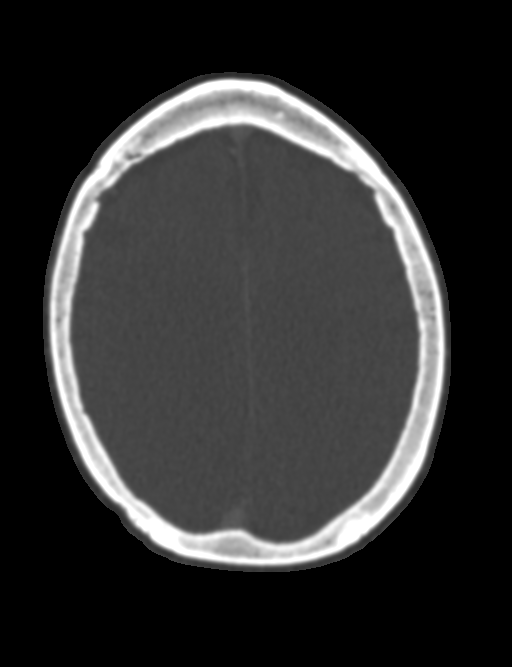
[im 161/193  brain]
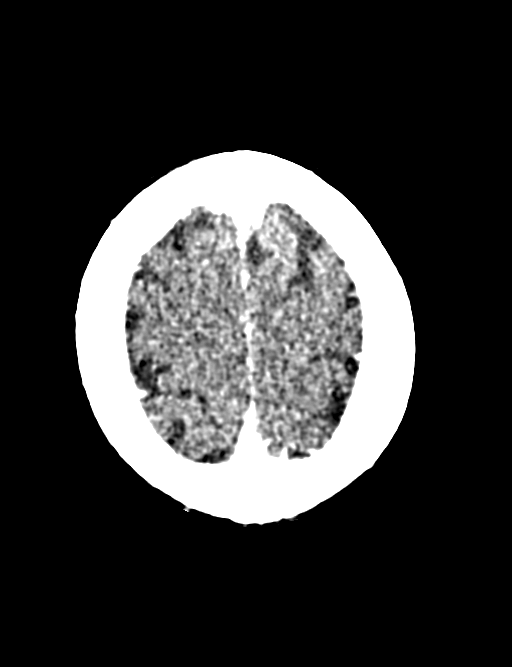
[im 182/193  bone]
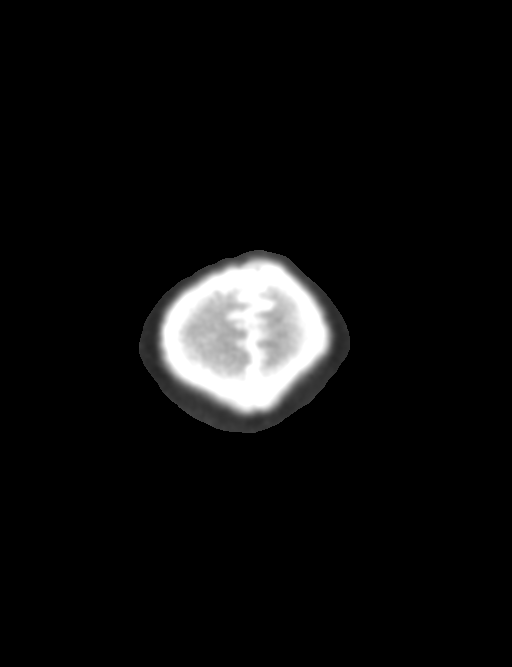

[Series 5: cor thin · coronal · 0.35mm/px · 3 of 193 slices shown]
[im 58/193  brain]
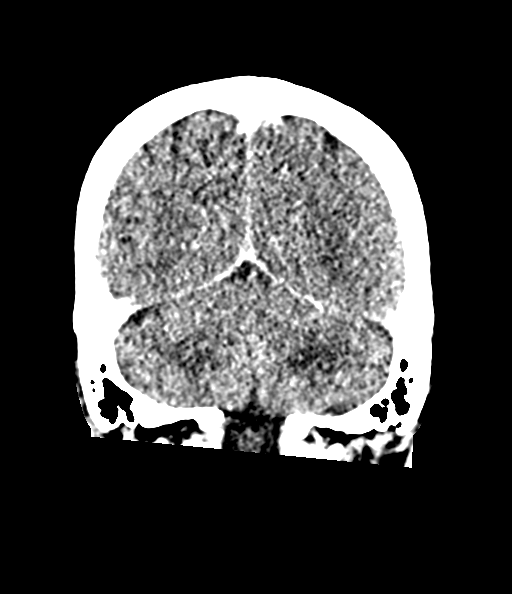
[im 83/193  brain]
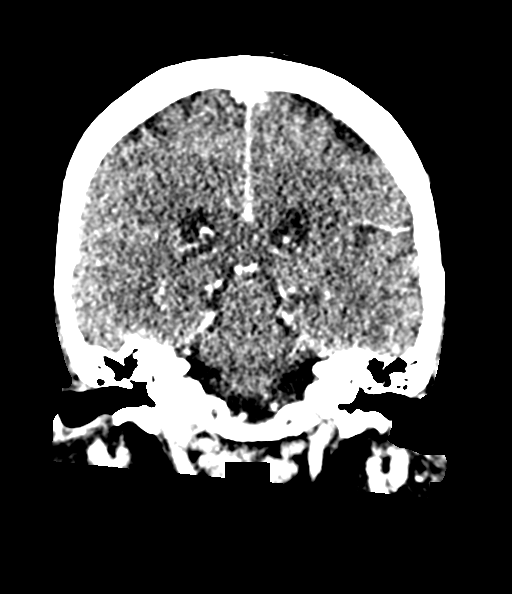
[im 109/193  brain]
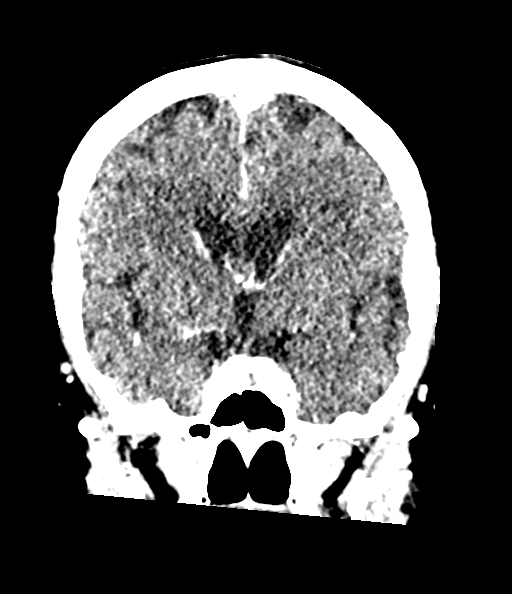

[Series 6: sag thin · sagittal · 0.38mm/px · 3 of 169 slices shown]
[im 34/169  brain]
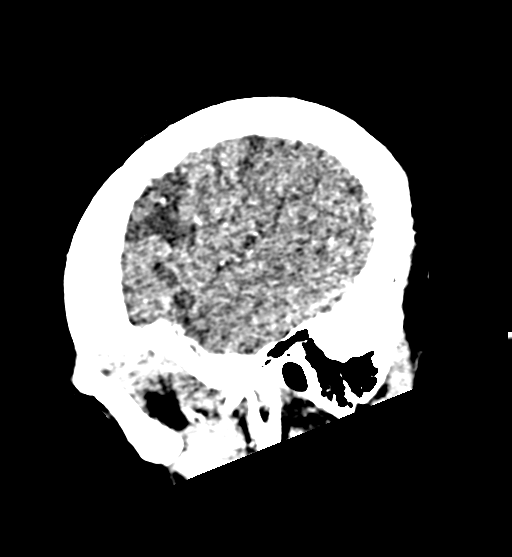
[im 68/169  brain]
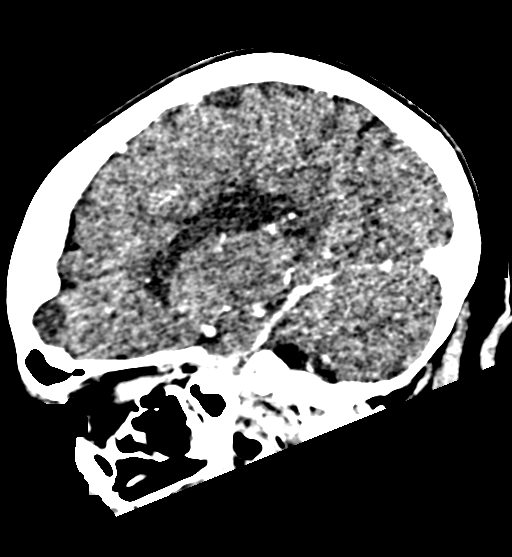
[im 101/169  brain]
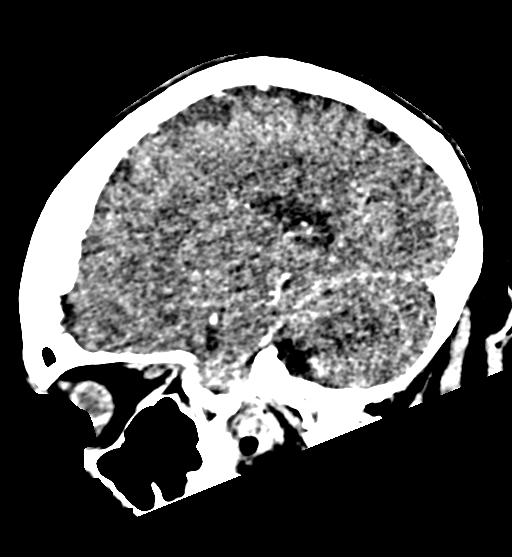

[16 of 47 positions shown; findings below may reference images not displayed]

FINDINGS: Superior sagittal sinus: Normal.

Straight sinus: Thrombosed

Inferior sagittal sinus, vein of ASSEGAAI and internal cerebral veins:
The vein of ASSEGAAI is thrombosed. Internal cerebral veins and
inferior sagittal sinus are patent

Transverse sinuses: Normal.

Sigmoid sinuses: Normal.

Visualized jugular veins: Normal.
IMPRESSION: Thrombosis of the straight sinus and vein of ASSEGAAI.

## 2021-02-03 MED ORDER — HEPARIN (PORCINE) 25000 UT/250ML-% IV SOLN
600.0000 [IU]/h | INTRAVENOUS | Status: AC
  Administered 2021-02-03: 750 [IU]/h via INTRAVENOUS
  Filled 2021-02-03: qty 250

## 2021-02-03 MED ORDER — ONDANSETRON HCL 4 MG/2ML IJ SOLN
4.0000 mg | Freq: Once | INTRAMUSCULAR | Status: AC
  Administered 2021-02-03: 4 mg via INTRAVENOUS
  Filled 2021-02-03: qty 2

## 2021-02-03 MED ORDER — SODIUM CHLORIDE 0.9 % IV BOLUS
500.0000 mL | Freq: Once | INTRAVENOUS | Status: AC
  Administered 2021-02-03: 500 mL via INTRAVENOUS

## 2021-02-03 MED ORDER — IOHEXOL 350 MG/ML SOLN
75.0000 mL | Freq: Once | INTRAVENOUS | Status: AC | PRN
  Administered 2021-02-03: 75 mL via INTRAVENOUS

## 2021-02-03 MED ORDER — FENTANYL CITRATE (PF) 100 MCG/2ML IJ SOLN
50.0000 ug | Freq: Once | INTRAMUSCULAR | Status: AC
  Administered 2021-02-03: 50 ug via INTRAVENOUS
  Filled 2021-02-03: qty 2

## 2021-02-03 NOTE — Plan of Care (Signed)
[facility] will assume care on arrival to accepting facility. Until arrival, care as per EDP. However, [facility] available 24/7 for questions and assistance.  Nursing staff please page [facility] Admits and Consults ([phone]) as soon as the patient arrives the hospital.   

## 2021-02-03 NOTE — ED Provider Notes (Signed)
[facility] DEPT Provider Note   CSN: [id number] Arrival date & time: 02/03/21  2058     History Chief Complaint  Patient presents with   Nausea   Headache   Covid Positive    Hannah Bates is a 87 y.o. female.  Patient just finished taking antiviral for COVID diagnosis.  Diagnosed with COVID 7 days ago.  Developed headache this afternoon with nausea and vomiting.  She has some diarrhea earlier in the course of her COVID diagnosis.  She denies any vision changes, abdominal pain, weakness, numbness.  The history is provided by the patient.  Headache Pain location:  Frontal Quality:  Dull Onset quality:  Gradual Duration:  5 hours Timing:  Constant Progression:  Unchanged Chronicity:  New Relieved by:  Nothing Worsened by:  Nothing Associated symptoms: nausea and vomiting   Associated symptoms: no abdominal pain, no back pain, no cough, no ear pain, no eye pain, no fever, no seizures and no sore throat       Past Medical History:  Diagnosis Date   Arthritis    Osteoporosis    Palpitations    PVC (premature ventricular contraction)     Patient Active Problem List   Diagnosis Date Noted   Asbestos exposure 08/10/2018   PVC's (premature ventricular contractions) 05/19/2016   Palpitations 03/30/2016   GERD 02/10/2007   OSTEOARTHRITIS 02/10/2007   OSTEOPOROSIS 02/10/2007    Past Surgical History:  Procedure Laterality Date   BREAST CYST ASPIRATION Bilateral    CHOLECYSTECTOMY     EYE SURGERY     FINGER GANGLION CYST EXCISION     rt thumb     OB History   No obstetric history on file.     Family History  Problem Relation Age of Onset   Hyperlipidemia Mother    Heart disease Father    Hyperlipidemia Father    Hypertension Father    Lung disease Father        black lung- miner    Cancer Sister    Heart disease Sister    Hyperlipidemia Sister    Hypertension Sister    Breast cancer Other     Social History    Tobacco Use   Smoking status: Never   Smokeless tobacco: Never  Substance Use Topics   Alcohol use: No    Alcohol/week: 0.0 standard drinks   Drug use: No    Home Medications Prior to Admission medications   Medication Sig Start Date End Date Taking? Authorizing Provider  Acetaminophen (TYLENOL PO) Take 1,000 mg by mouth daily as needed (pain/headache).     [provider]  atorvastatin (LIPITOR) 20 MG tablet Take 20 mg by mouth daily.    [provider]  Biotin 10000 MCG TABS Take 1 tablet by mouth daily.    [provider]  calcium citrate (CALCITRATE - DOSED IN MG ELEMENTAL CALCIUM) 950 MG tablet Take 200 mg of elemental calcium by mouth daily.    [provider]  Cholecalciferol (VITAMIN D3) 5000 units CAPS Take 5,000 Units by mouth daily.    [provider]  estradiol (ESTRACE) 0.1 MG/GM vaginal cream PLACE A PEA- SIZED AMOUNT IN THE VAGINA NIGHTLY FOR 2 WEEKS, THEN USE EVERY OTHER NIGHT 09/09/20   [provider]  famotidine (PEPCID) 20 MG tablet Take 20 mg by mouth daily.    [provider]  Loratadine (CLARITIN PO) Take 1 tablet by mouth daily.    [provider]  Multiple Vitamins-Minerals (  MULTIVITAMIN WITH MINERALS) tablet Take 1 tablet by mouth daily.    [provider]  omeprazole (PRILOSEC) 20 MG capsule 1 capsule 30 minutes before morning meal 01/13/15   [provider]  triamcinolone cream (KENALOG) 0.1 % Apply 1 application topically 2 (two) times daily. 12/22/19   Vanessa Kick, MD    Allergies    Codeine, Sulfonamide derivatives, and Other  Review of Systems   Review of Systems  Constitutional:  Negative for chills and fever.  HENT:  Negative for ear pain and sore throat.   Eyes:  Negative for pain and visual disturbance.  Respiratory:  Negative for cough and shortness of breath.   Cardiovascular:  Negative for chest pain and palpitations.  Gastrointestinal:  Positive for  nausea and vomiting. Negative for abdominal pain.  Genitourinary:  Negative for dysuria and hematuria.  Musculoskeletal:  Negative for arthralgias and back pain.  Skin:  Negative for color change and rash.  Neurological:  Positive for headaches. Negative for seizures and syncope.  All other systems reviewed and are negative.  Physical Exam Updated Vital Signs BP (!) 158/67   Pulse 67   Temp 97.7 F (36.5 C) (Oral)   Resp 18   Ht 5' (1.524 m)   Wt 67.8 kg   SpO2 99%   BMI 29.19 kg/m   Physical Exam Vitals and nursing note reviewed.  Constitutional:      General: She is not in acute distress.    Appearance: She is well-developed.  HENT:     Head: Normocephalic and atraumatic.  Eyes:     General: No visual field deficit.    Conjunctiva/sclera: Conjunctivae normal.  Cardiovascular:     Rate and Rhythm: Normal rate and regular rhythm.     Heart sounds: Normal heart sounds. No murmur heard. Pulmonary:     Effort: Pulmonary effort is normal. No respiratory distress.     Breath sounds: Normal breath sounds.  Abdominal:     Palpations: Abdomen is soft.     Tenderness: There is no abdominal tenderness.  Musculoskeletal:     Cervical back: Neck supple.  Skin:    General: Skin is warm and dry.  Neurological:     Mental Status: She is alert and oriented to person, place, and time.     Cranial Nerves: No cranial nerve deficit, dysarthria or facial asymmetry.     Sensory: No sensory deficit.     Motor: No weakness.     Coordination: Coordination normal.     Comments: 5+ out of 5 strength throughout, normal sensation, no drift, normal finger-nose-finger, normal speech  Psychiatric:        Mood and Affect: Mood normal.    ED Results / Procedures / Treatments   Labs (all labs ordered are listed, but only abnormal results are displayed) Labs Reviewed  CBC WITH DIFFERENTIAL/PLATELET - Abnormal; Notable for the following components:      Result Value   WBC 10.6 (*)    All  other components within normal limits  BASIC METABOLIC PANEL - Abnormal; Notable for the following components:   CO2 21 (*)    Glucose, Bld 142 (*)    All other components within normal limits  RESP PANEL BY RT-PCR (FLU A&B, COVID) [id number]  HEPATIC FUNCTION PANEL    EKG None  Radiology CT HEAD WO CONTRAST (5MM)  Result Date: 02/03/2021 CLINICAL DATA:  Pain, headache today. EXAM: CT HEAD WITHOUT CONTRAST TECHNIQUE: Contiguous axial images were obtained from the base of  the skull through the vertex without intravenous contrast. COMPARISON:  None. FINDINGS: Brain: There is thickening and hyperdensity involving the straight sinus suspicious for thrombus. No definite hemorrhage. Normal brain volume for age. Minimal periventricular and deep chronic small vessel ischemia. No evidence of acute infarct. No hydrocephalus. No midline shift. Vascular: Thickening and hyperdensity involving the straight sinus suspicious for thrombus. No evidence of hyperdense MCA. Mild carotid atherosclerosis at the skull base. Skull: No fracture or focal lesion. Sinuses/Orbits: Paranasal sinuses and mastoid air cells are clear. The visualized orbits are unremarkable. Bilateral cataract resection Other: None. IMPRESSION: Thickening and hyperdensity involving the straight sinus suspicious for thrombus. No visualized venous infarct. Recommend further evaluation with CTV. Electronically Signed   By: Keith Rake M.D.   On: 02/03/2021 21:51    Procedures .Critical Care  Date/Time: 02/03/2021 10:47 PM Performed by: Lennice Sites, DO Authorized by: Lennice Sites, DO   Critical care provider statement:    Critical care time (minutes):  40   Critical care was necessary to treat or prevent imminent or life-threatening deterioration of the following conditions:  CNS failure or compromise (cerebral venous thrombosis)   Critical care was time spent personally by me on the following activities:  Blood draw for specimens,  development of treatment plan with patient or surrogate, discussions with consultants, discussions with primary provider, evaluation of patient's response to treatment, examination of patient, obtaining history from patient or surrogate, ordering and performing treatments and interventions, ordering and review of laboratory studies, ordering and review of radiographic studies, re-evaluation of patient's condition, pulse oximetry and review of old charts   Care discussed with: admitting provider     Medications Ordered in ED Medications  ondansetron (ZOFRAN) injection 4 mg (4 mg Intravenous Given 02/03/21 2122)  sodium chloride 0.9 % bolus 500 mL (500 mLs Intravenous New Bag/Given 02/03/21 2118)  fentaNYL (SUBLIMAZE) injection 50 mcg (50 mcg Intravenous Given 02/03/21 2207)  iohexol (OMNIPAQUE) 350 MG/ML injection 75 mL (75 mLs Intravenous Contrast Given 02/03/21 2220)    ED Course  I have reviewed the triage vital signs and the nursing notes.  Pertinent labs & imaging results that were available during my care of the patient were reviewed by me and considered in my medical decision making (see chart for details).    MDM Rules/Calculators/A&P                           Stoney Bang is here with headache, nausea, vomiting.  Diagnosed with COVID 7 days ago.  Finished a course of paxlovid, had some diarrhea during this time but developed headache, nausea, vomiting this afternoon.  Denies any abdominal pain.  Denies any numbness, weakness, speech changes, vision changes.  Neurologically she appears intact.  Abdomen is soft.  Denies any respiratory symptoms, chest pain, cough, sputum production.  Will get head CT and basic labs and focus on symptom management with IV fluids and IV Zofran.  Suspect sequelae of COVID and may be even antiviral.  Radiology called me on the phone to discuss head CT.  Overall concern for venous sinus thrombosis.  Suspect that this is involving the straight sinus given  thickening and hyperdensity.  There is no obvious infarcts.  No head bleed.  We will get CT venogram.  Have already talked with Dr. Leonel Ramsay with neurology who anticipates starting anticoagulation following CT venogram.  CT venogram confirms thrombosis of the straight sinus and vein of [place].  Patient to be  started on heparin bolus and infusion.  Dr. Leonel Ramsay with neurology is aware.  Patient to be admitted to hospitalist at [facility].  Hemodynamically stable throughout my care.  This chart was dictated using voice recognition software.  Despite best efforts to proofread,  errors can occur which can change the documentation meaning.   Final Clinical Impression(s) / ED Diagnoses Final diagnoses:  Nonintractable headache, unspecified chronicity pattern, unspecified headache type  Cerebral venous thrombosis of straight sinus    Rx / DC Orders ED Discharge Orders     None        Lennice Sites, DO 02/03/21 2250

## 2021-02-03 NOTE — Progress Notes (Signed)
ANTICOAGULATION CONSULT NOTE - Initial Consult  Pharmacy Consult for heparin Indication:  CVST  Allergies  Allergen Reactions   Codeine     REACTION: Rash   Sulfonamide Derivatives     REACTION: Rash   Other Rash    Event monitor leads need....sensitive    Patient Measurements: Height: 5' (152.4 cm) Weight: 67.8 kg (149 lb 7.6 oz) IBW/kg (Calculated) : 45.5 Heparin Dosing Weight: 60.2 kg  Vital Signs: Temp: 97.7 F (36.5 C) (08/02 2108) Temp Source: Oral (08/02 2108) BP: 158/67 (08/02 2200) Pulse Rate: 67 (08/02 2200)  Labs: Recent Labs    02/03/21 2122  HGB 15.0  HCT 44.4  PLT 286  CREATININE 0.88    Estimated Creatinine Clearance: 40.9 mL/min (by C-G formula based on SCr of 0.88 mg/dL).   Medical History: Past Medical History:  Diagnosis Date   Arthritis    Osteoporosis    Palpitations    PVC (premature ventricular contraction)    Assessment: 87 yo F with COVID dx from 7 days ago - with new onset headache + N/V. CT head with venous sinus thrombosis - no bleed. No PTA AC. CT venogram confirmed thrombosis. Heparin consult for anticoagulation.  Goal of Therapy:  Heparin level 0.3-0.5 units/mL Monitor platelets by anticoagulation protocol: Yes   Plan:  Start heparin infusion at 750 units/hr Check anti-Xa level in 8 hours and daily while on heparin Continue to monitor H&H and platelets  Joetta Manners, PharmD, [facility] Emergency Medicine Clinical Pharmacist ED RPh Phone: [place]: [phone]

## 2021-02-03 NOTE — ED Triage Notes (Signed)
Pt tested Pos for COVID 7 days ago, took paxlovid, finished yesterday.  Pt states HA started today at 1500, N/V started 1 hour aog.  Pt state negative covid test today.

## 2021-02-04 ENCOUNTER — Other Ambulatory Visit: Payer: Self-pay

## 2021-02-04 ENCOUNTER — Inpatient Hospital Stay (HOSPITAL_COMMUNITY): Payer: PPO

## 2021-02-04 DIAGNOSIS — U071 COVID-19: Secondary | ICD-10-CM | POA: Diagnosis present

## 2021-02-04 DIAGNOSIS — G08 Intracranial and intraspinal phlebitis and thrombophlebitis: Principal | ICD-10-CM

## 2021-02-04 DIAGNOSIS — E785 Hyperlipidemia, unspecified: Secondary | ICD-10-CM | POA: Diagnosis present

## 2021-02-04 DIAGNOSIS — K219 Gastro-esophageal reflux disease without esophagitis: Secondary | ICD-10-CM | POA: Diagnosis present

## 2021-02-04 DIAGNOSIS — Z83438 Family history of other disorder of lipoprotein metabolism and other lipidemia: Secondary | ICD-10-CM | POA: Diagnosis not present

## 2021-02-04 DIAGNOSIS — Z885 Allergy status to narcotic agent status: Secondary | ICD-10-CM | POA: Diagnosis not present

## 2021-02-04 DIAGNOSIS — D6859 Other primary thrombophilia: Secondary | ICD-10-CM | POA: Diagnosis present

## 2021-02-04 DIAGNOSIS — Z79899 Other long term (current) drug therapy: Secondary | ICD-10-CM | POA: Diagnosis not present

## 2021-02-04 DIAGNOSIS — Z882 Allergy status to sulfonamides status: Secondary | ICD-10-CM | POA: Diagnosis not present

## 2021-02-04 DIAGNOSIS — I8290 Acute embolism and thrombosis of unspecified vein: Secondary | ICD-10-CM | POA: Diagnosis present

## 2021-02-04 DIAGNOSIS — M810 Age-related osteoporosis without current pathological fracture: Secondary | ICD-10-CM | POA: Diagnosis present

## 2021-02-04 DIAGNOSIS — I10 Essential (primary) hypertension: Secondary | ICD-10-CM | POA: Diagnosis present

## 2021-02-04 DIAGNOSIS — Z9049 Acquired absence of other specified parts of digestive tract: Secondary | ICD-10-CM | POA: Diagnosis not present

## 2021-02-04 DIAGNOSIS — I493 Ventricular premature depolarization: Secondary | ICD-10-CM | POA: Diagnosis present

## 2021-02-04 LAB — CBC
HCT: 42 % (ref 36.0–46.0)
Hemoglobin: 13.9 g/dL (ref 12.0–15.0)
MCH: 32.4 pg (ref 26.0–34.0)
MCHC: 33.1 g/dL (ref 30.0–36.0)
MCV: 97.9 fL (ref 80.0–100.0)
Platelets: 259 10*3/uL (ref 150–400)
RBC: 4.29 MIL/uL (ref 3.87–5.11)
RDW: 12.7 % (ref 11.5–15.5)
WBC: 8.1 10*3/uL (ref 4.0–10.5)
nRBC: 0 % (ref 0.0–0.2)

## 2021-02-04 LAB — BRAIN NATRIURETIC PEPTIDE: B Natriuretic Peptide: 95.2 pg/mL (ref 0.0–100.0)

## 2021-02-04 LAB — HEPARIN LEVEL (UNFRACTIONATED): Heparin Unfractionated: 0.94 IU/mL — ABNORMAL HIGH (ref 0.30–0.70)

## 2021-02-04 LAB — GLUCOSE, CAPILLARY: Glucose-Capillary: 114 mg/dL — ABNORMAL HIGH (ref 70–99)

## 2021-02-04 IMAGING — MR MR HEAD WO/W CM
9 of 12 series · 35 of 48 positions shown · IV contrast (gadavist)
Comparison: CT venogram head [DATE]

CLINICAL DATA: Suspect dural venous sinus thrombosis.  Headache

EXAM:
MRI HEAD WITHOUT AND WITH CONTRAST
MR VENOGRAM HEAD WITHOUT AND WITH CONTRAST
TECHNIQUE: Multiplanar, multi-echo pulse sequences of the brain and surrounding
structures were acquired without and with intravenous contrast.
Angiographic images of the intracranial venous structures were
acquired using MRV technique without and with intravenous contrast.
CONTRAST:  6mL GADAVIST GADOBUTROL 1 MMOL/ML IV SOLN

[Series 5: DWI · axial · 3.0mm · 1.09mm/px · z∈[-76,+74]mm · 9 of 102 slices shown (1 of 4)]
[im 1/102]
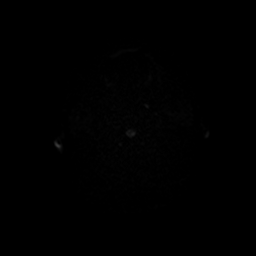
[im 13/102]
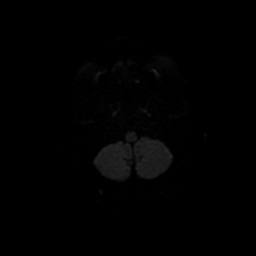
[im 26/102]
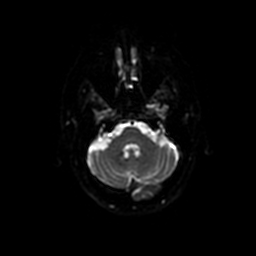
[im 38/102]
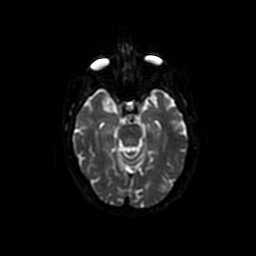
[im 51/102]
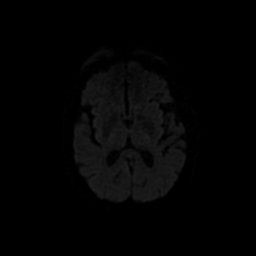
[im 64/102]
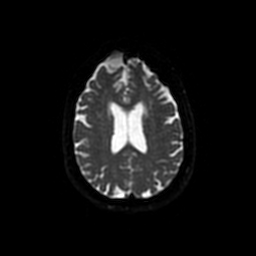
[im 76/102]
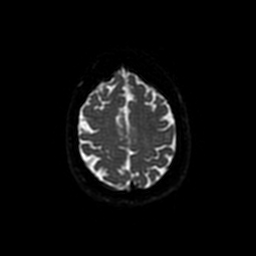
[im 89/102]
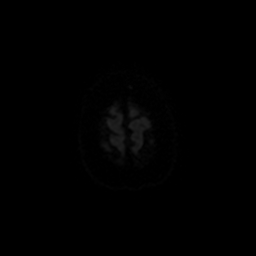
[im 102/102]
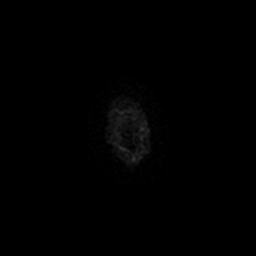

[Series 6: DWI · coronal · 5.0mm · 1.09mm/px · 7 of 80 slices shown (2 of 4)]
[im 1/80]
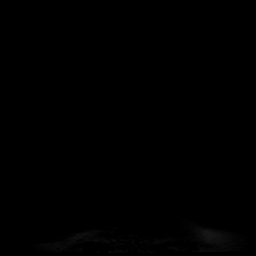
[im 14/80]
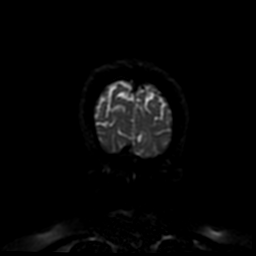
[im 27/80]
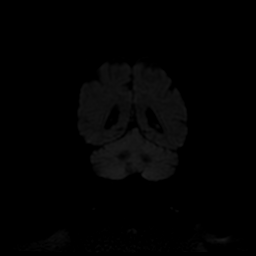
[im 40/80]
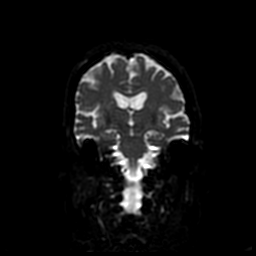
[im 53/80]
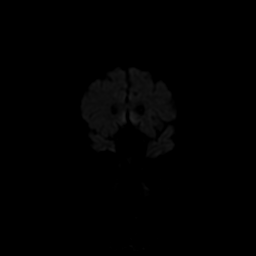
[im 66/80]
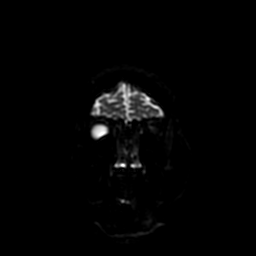
[im 80/80]
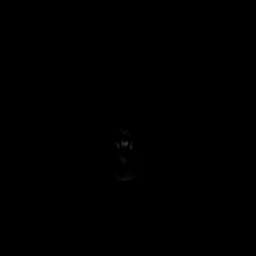

[Series 8: T2 · axial · 5.0mm · 0.43mm/px · z∈[-74,+64]mm · 2 of 24 slices shown]
[im 1/24]
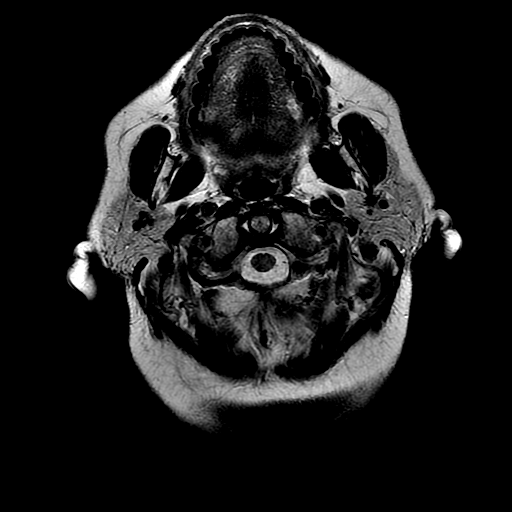
[im 24/24]
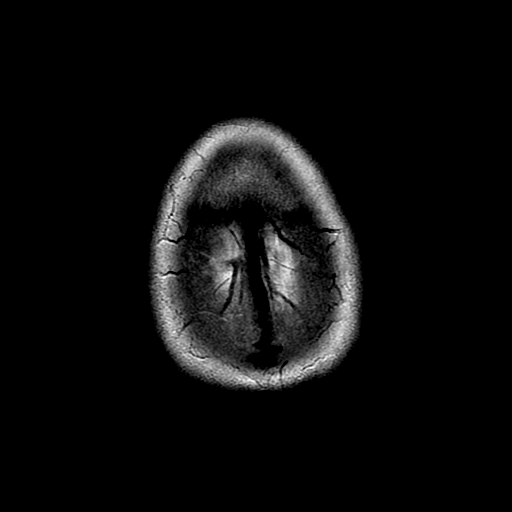

[Series 9: FLAIR · axial · 3.0mm · 0.43mm/px · z∈[-74,+64]mm · 2 of 24 slices shown]
[im 1/24]
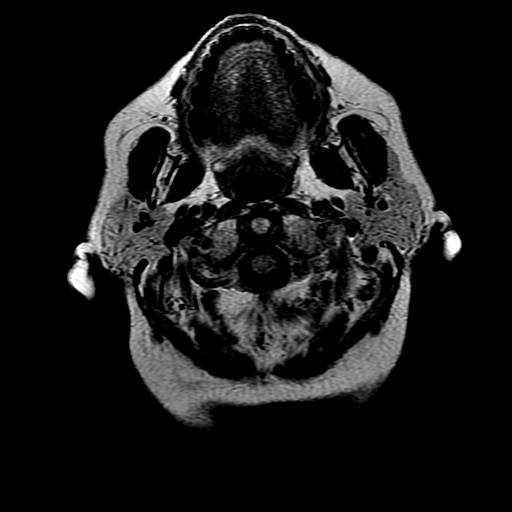
[im 24/24]
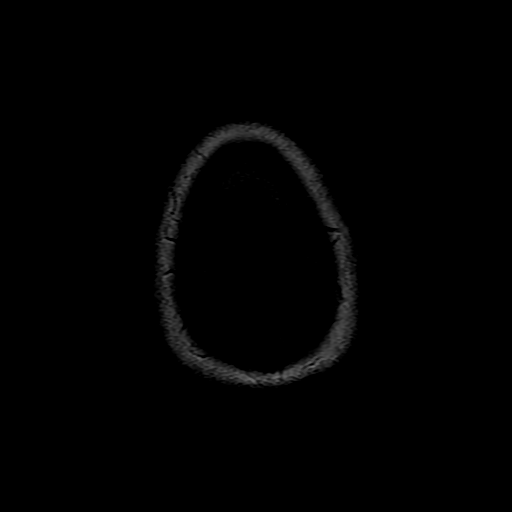

[Series 12: T2 post-contrast · coronal · 5.0mm · 0.39mm/px · 2 of 24 slices shown]
[im 1/24]
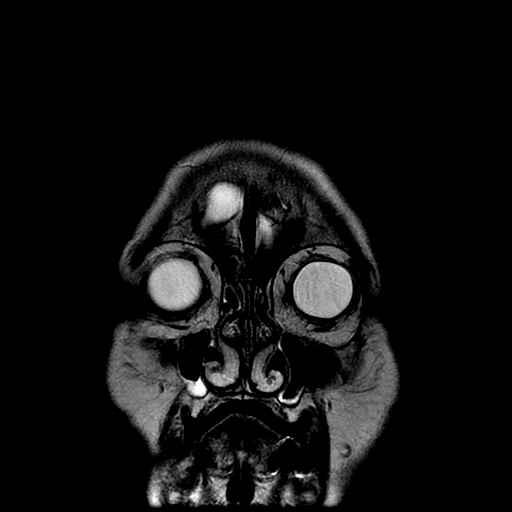
[im 24/24]
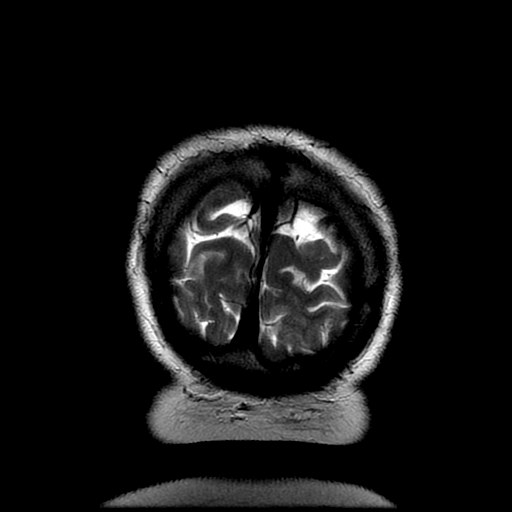

[Series 13: T1 post-contrast · axial · 3.0mm · 0.47mm/px · z∈[-79,+68]mm · 4 of 50 slices shown (1 of 2)]
[im 1/50]
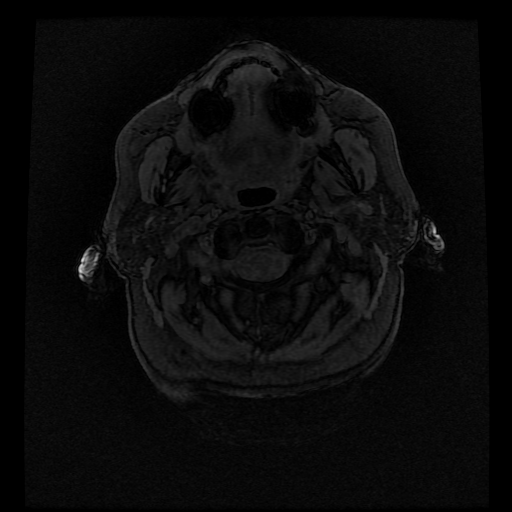
[im 17/50]
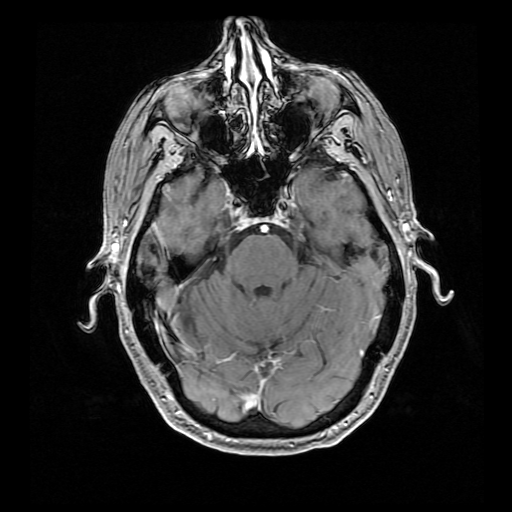
[im 33/50]
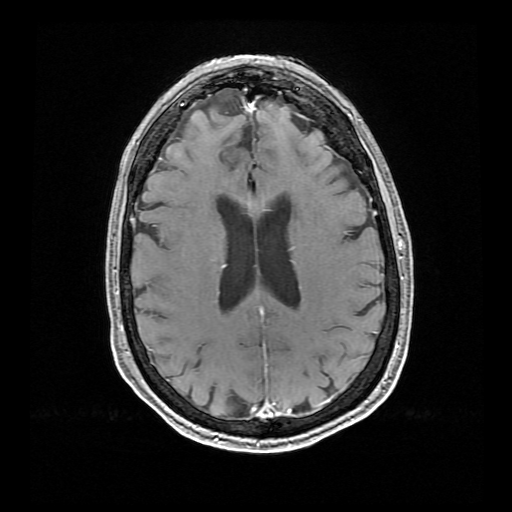
[im 50/50]
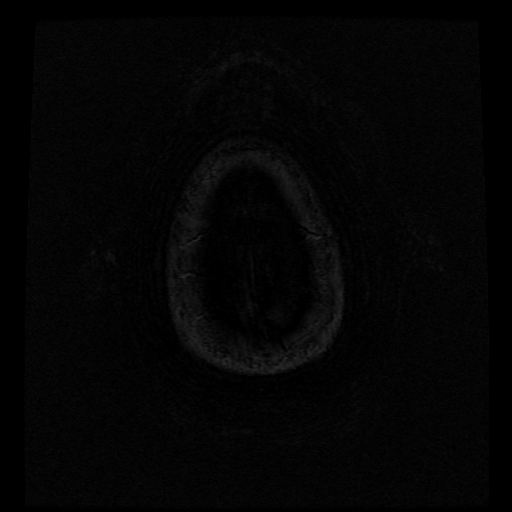

[Series 14: T1 post-contrast · coronal · 5.0mm · 0.39mm/px · 2 of 24 slices shown (2 of 2)]
[im 1/24]
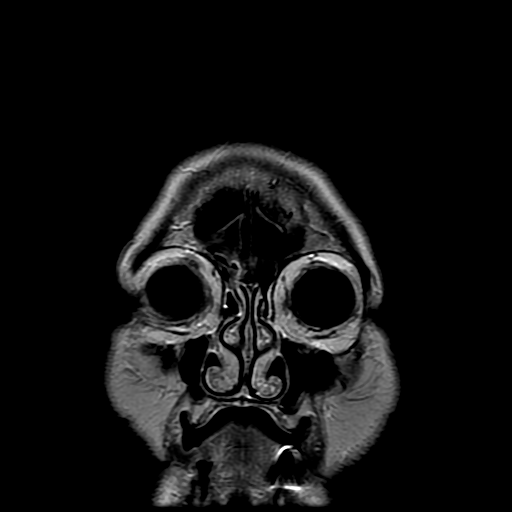
[im 24/24]
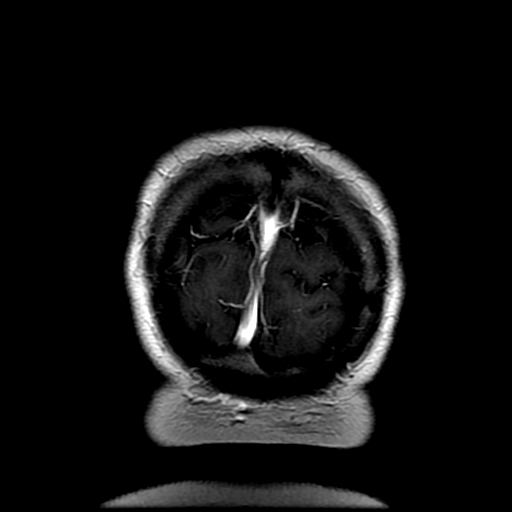

[Series 500: DWI · axial · 3.0mm · 1.09mm/px · z∈[-76,+74]mm · 4 of 51 slices shown (3 of 4)]
[im 1/51]
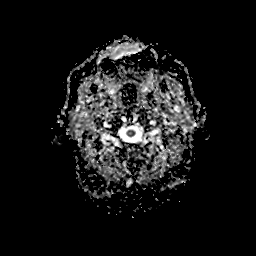
[im 17/51]
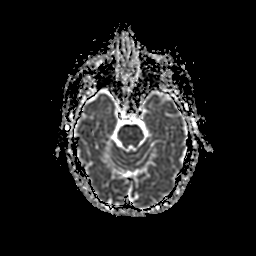
[im 34/51]
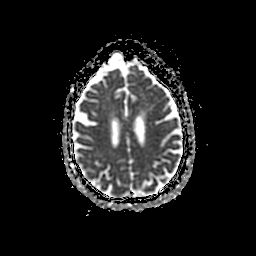
[im 51/51]
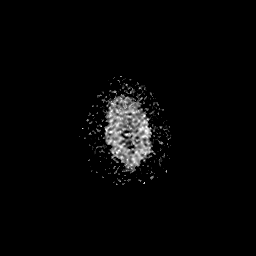

[Series 600: DWI · coronal · 5.0mm · 1.09mm/px · 3 of 39 slices shown (4 of 4)]
[im 1/39]
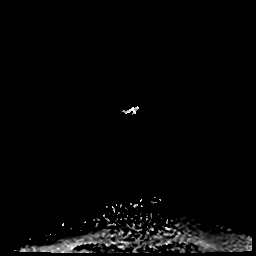
[im 20/39]
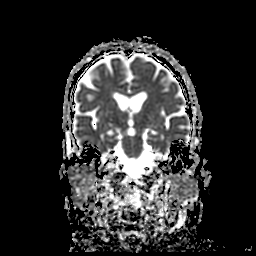
[im 39/39]
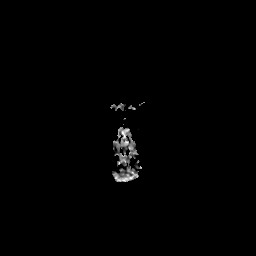

[35 of 48 positions shown; findings below may reference images not displayed]

FINDINGS: MRI HEAD WITHOUT AND WITH CONTRAST

Brain: Mild cerebral atrophy. Negative for hydrocephalus. Negative
for acute infarct

Mild patchy white matter hyperintensity on FLAIR. Brainstem and
cerebellum intact. Negative for hemorrhage or mass.

Vascular: Filling defect in the straight sinus compatible with
venous thrombosis. Normal arterial flow voids.

Skull and upper cervical spine: No focal skeletal lesion.

Sinuses/Orbits: Negative

Other: None

MR VENOGRAM HEAD WITHOUT AND WITH CONTRAST

Filling defect compatible with thrombosis in the vein of JIM and
straight sinus. Relatively large amount of thrombus which appears
nonocclusive. Superior sagittal sinus widely patent. Transverse
sinus and sigmoid sinus patent bilaterally.
IMPRESSION: 1. Thrombus in the vein of JIM and straight sinus as identified on
CT venogram. Remaining dural venous sinuses are patent
2. Negative for acute infarct.

## 2021-02-04 IMAGING — MR MR [PERSON_NAME] HEAD
4 of 5 series · 19 of 48 positions shown · IV contrast (Yes   MULTIHANCE)
Comparison: CT venogram head [DATE]

CLINICAL DATA: Suspect dural venous sinus thrombosis.  Headache

EXAM:
MRI HEAD WITHOUT AND WITH CONTRAST
MR VENOGRAM HEAD WITHOUT AND WITH CONTRAST
TECHNIQUE: Multiplanar, multi-echo pulse sequences of the brain and surrounding
structures were acquired without and with intravenous contrast.
Angiographic images of the intracranial venous structures were
acquired using MRV technique without and with intravenous contrast.
CONTRAST:  6mL GADAVIST GADOBUTROL 1 MMOL/ML IV SOLN

[Series 3: sag inhance(25 venc) · sagittal · 1.6mm · 0.47mm/px · 10 of 399 slices shown]
[im 19/399]
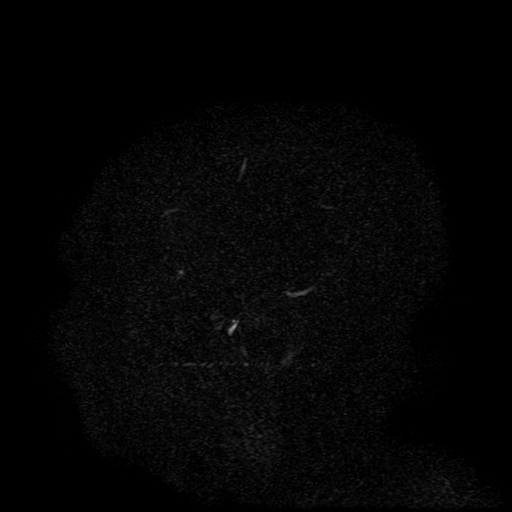
[im 57/399]
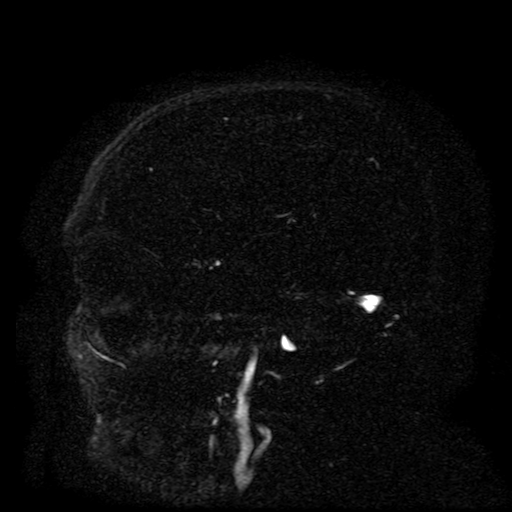
[im 76/399]
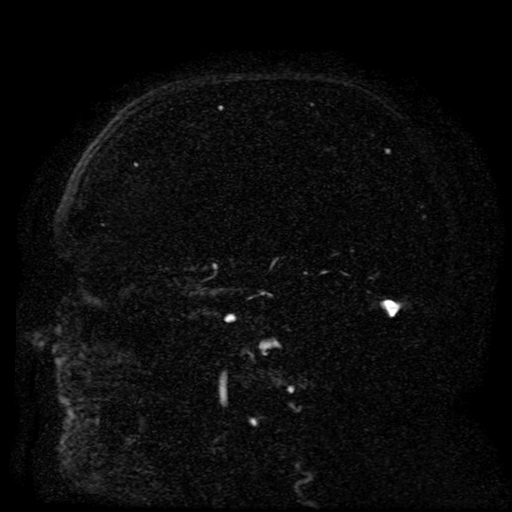
[im 114/399]
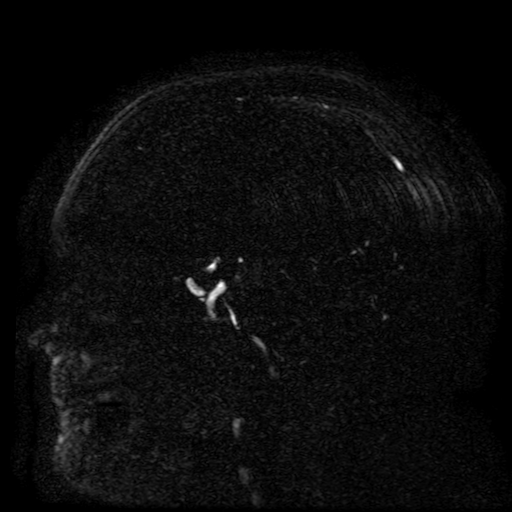
[im 171/399]
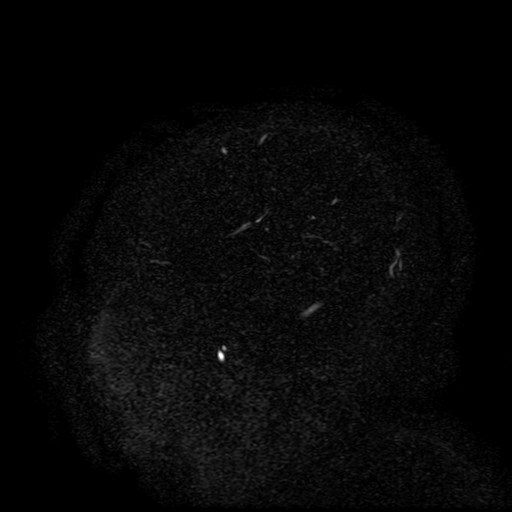
[im 209/399]
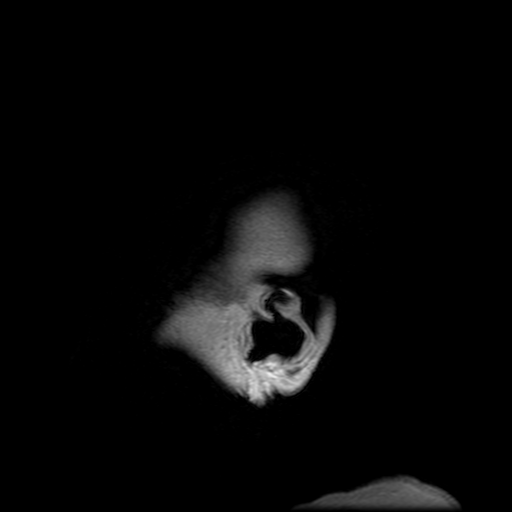
[im 228/399]
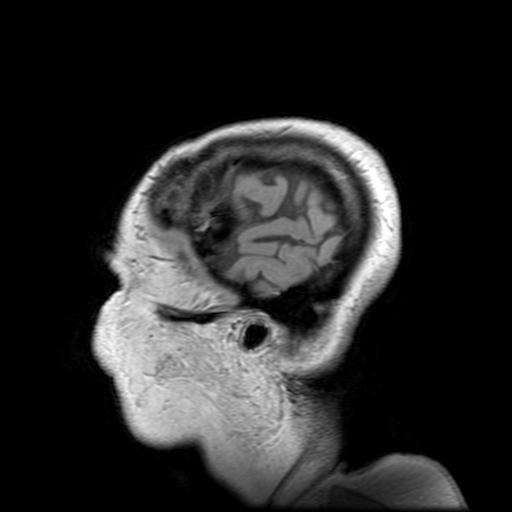
[im 285/399]
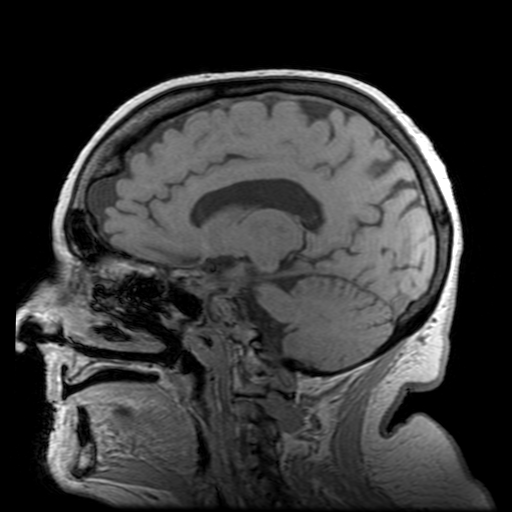
[im 323/399]
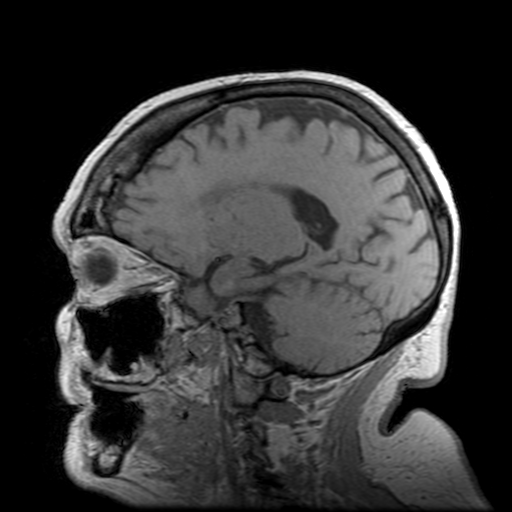
[im 342/399]
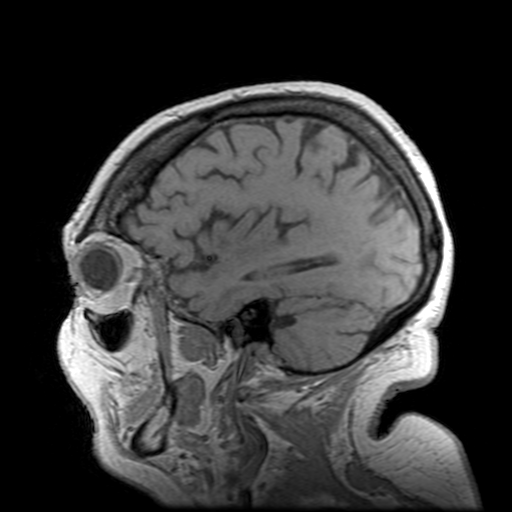

[Series 4: MRV · coronal · 1.5mm · 0.47mm/px · 3 of 115 slices shown]
[im 23/115]
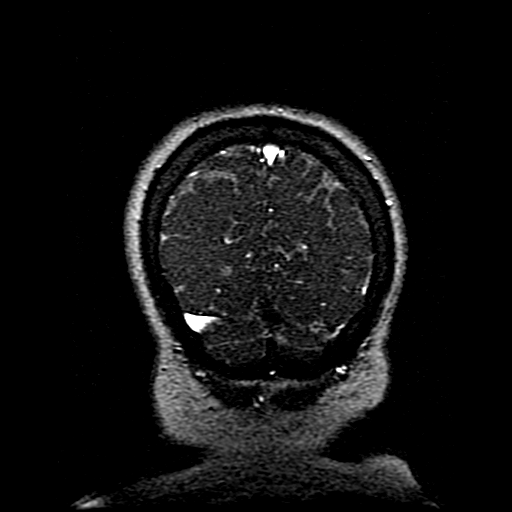
[im 69/115]
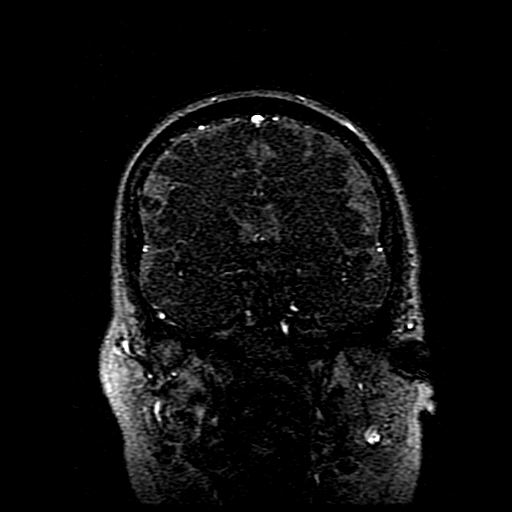
[im 115/115]
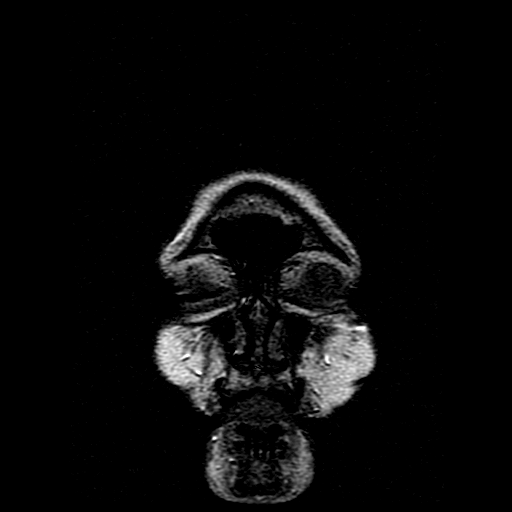

[Series 1500: processed images · coronal · 2.0mm · 0.43mm/px · 3 of 97 slices shown (1 of 2)]
[im 20/97]
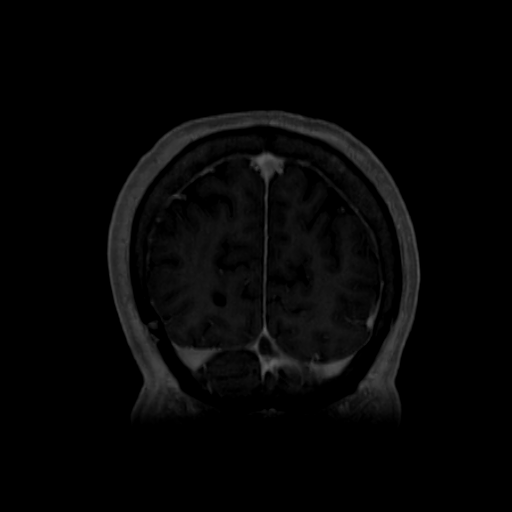
[im 58/97]
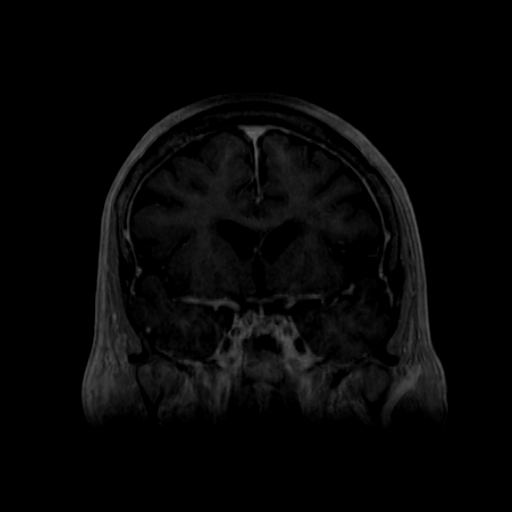
[im 97/97]
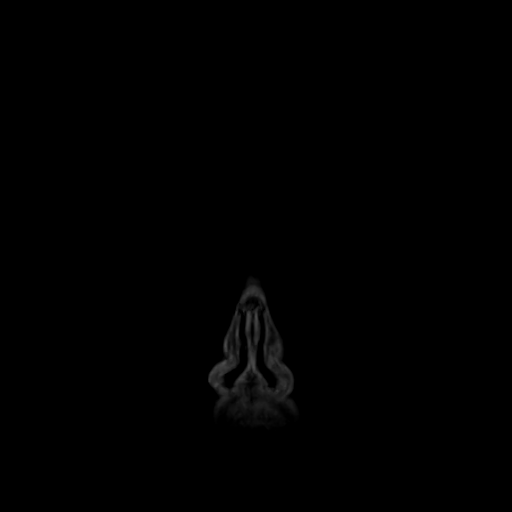

[Series 1501: processed images · axial · 1.0mm · 0.49mm/px · z∈[+5,+114]mm · 3 of 81 slices shown (2 of 2)]
[im 1/81]
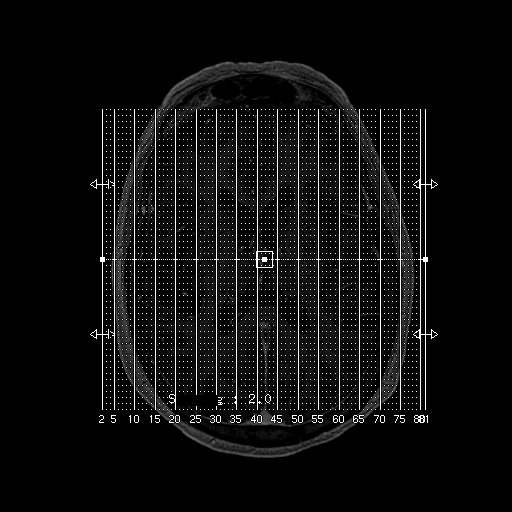
[im 41/81]
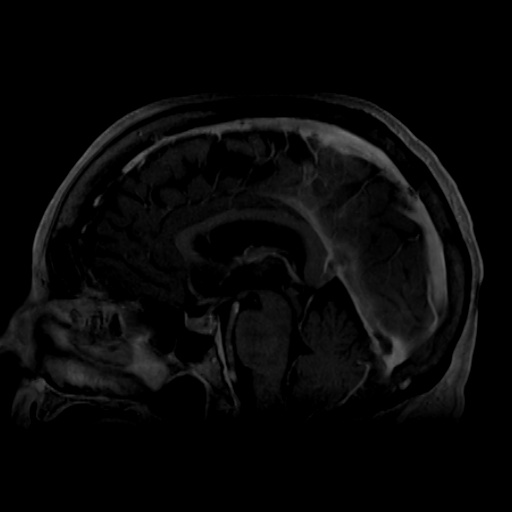
[im 81/81]
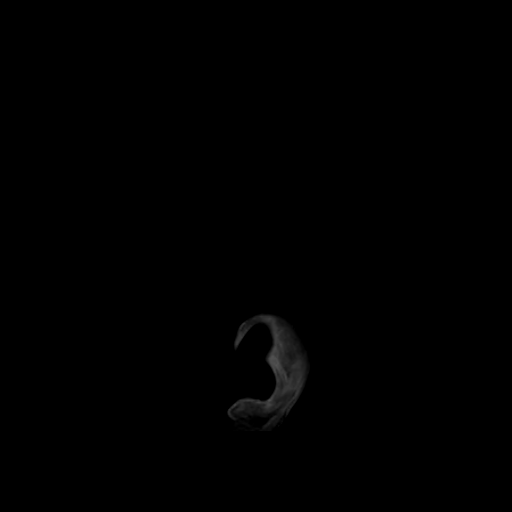

[19 of 48 positions shown; findings below may reference images not displayed]

FINDINGS: MRI HEAD WITHOUT AND WITH CONTRAST

Brain: Mild cerebral atrophy. Negative for hydrocephalus. Negative
for acute infarct

Mild patchy white matter hyperintensity on FLAIR. Brainstem and
cerebellum intact. Negative for hemorrhage or mass.

Vascular: Filling defect in the straight sinus compatible with
venous thrombosis. Normal arterial flow voids.

Skull and upper cervical spine: No focal skeletal lesion.

Sinuses/Orbits: Negative

Other: None

MR VENOGRAM HEAD WITHOUT AND WITH CONTRAST

Filling defect compatible with thrombosis in the vein of JIM and
straight sinus. Relatively large amount of thrombus which appears
nonocclusive. Superior sagittal sinus widely patent. Transverse
sinus and sigmoid sinus patent bilaterally.
IMPRESSION: 1. Thrombus in the vein of JIM and straight sinus as identified on
CT venogram. Remaining dural venous sinuses are patent
2. Negative for acute infarct.

## 2021-02-04 MED ORDER — ONDANSETRON HCL 4 MG PO TABS: 4.0000 mg | ORAL_TABLET | Freq: Four times a day (QID) | ORAL | Status: DC | PRN

## 2021-02-04 MED ORDER — HYDRALAZINE HCL 20 MG/ML IJ SOLN: 10.0000 mg | INTRAMUSCULAR | Status: DC | PRN

## 2021-02-04 MED ORDER — SODIUM CHLORIDE 0.9 % IV SOLN: INTRAVENOUS | Status: DC

## 2021-02-04 MED ORDER — SENNOSIDES-DOCUSATE SODIUM 8.6-50 MG PO TABS: 1.0000 | ORAL_TABLET | Freq: Every evening | ORAL | Status: DC | PRN

## 2021-02-04 MED ORDER — VITAMIN D 25 MCG (1000 UNIT) PO TABS
5000.0000 [IU] | ORAL_TABLET | Freq: Every day | ORAL | Status: DC
  Administered 2021-02-04 – 2021-02-05 (×2): 5000 [IU] via ORAL
  Filled 2021-02-04 (×2): qty 5

## 2021-02-04 MED ORDER — SODIUM CHLORIDE 0.9 % IV SOLN: 250.0000 mL | INTRAVENOUS | Status: DC | PRN

## 2021-02-04 MED ORDER — ADULT MULTIVITAMIN W/MINERALS CH
1.0000 | ORAL_TABLET | Freq: Every day | ORAL | Status: DC
  Administered 2021-02-04 – 2021-02-05 (×2): 1 via ORAL
  Filled 2021-02-04 (×2): qty 1

## 2021-02-04 MED ORDER — TRAZODONE HCL 50 MG PO TABS: 25.0000 mg | ORAL_TABLET | Freq: Every evening | ORAL | Status: DC | PRN

## 2021-02-04 MED ORDER — FAMOTIDINE 20 MG PO TABS
20.0000 mg | ORAL_TABLET | Freq: Every day | ORAL | Status: DC
  Administered 2021-02-04 – 2021-02-05 (×2): 20 mg via ORAL
  Filled 2021-02-04 (×2): qty 1

## 2021-02-04 MED ORDER — GADOBUTROL 1 MMOL/ML IV SOLN
6.0000 mL | Freq: Once | INTRAVENOUS | Status: AC | PRN
  Administered 2021-02-04: 6 mL via INTRAVENOUS

## 2021-02-04 MED ORDER — DABIGATRAN ETEXILATE MESYLATE 150 MG PO CAPS
150.0000 mg | ORAL_CAPSULE | Freq: Two times a day (BID) | ORAL | Status: DC
  Administered 2021-02-04 – 2021-02-05 (×2): 150 mg via ORAL
  Filled 2021-02-04 (×3): qty 1

## 2021-02-04 MED ORDER — BIOTIN 10000 MCG PO TABS: 1.0000 | ORAL_TABLET | Freq: Every day | ORAL | Status: DC

## 2021-02-04 MED ORDER — SODIUM CHLORIDE 0.9% FLUSH
3.0000 mL | Freq: Two times a day (BID) | INTRAVENOUS | Status: DC
  Administered 2021-02-04 – 2021-02-05 (×3): 3 mL via INTRAVENOUS

## 2021-02-04 MED ORDER — ATORVASTATIN CALCIUM 10 MG PO TABS
20.0000 mg | ORAL_TABLET | Freq: Every day | ORAL | Status: DC
  Administered 2021-02-04 – 2021-02-05 (×2): 20 mg via ORAL
  Filled 2021-02-04 (×2): qty 2

## 2021-02-04 MED ORDER — ACETAMINOPHEN 650 MG RE SUPP: 650.0000 mg | Freq: Four times a day (QID) | RECTAL | Status: DC | PRN

## 2021-02-04 MED ORDER — CALCIUM CITRATE 950 (200 CA) MG PO TABS
200.0000 mg | ORAL_TABLET | Freq: Every day | ORAL | Status: DC
  Administered 2021-02-05: 200 mg via ORAL
  Filled 2021-02-04 (×2): qty 1

## 2021-02-04 MED ORDER — BISACODYL 5 MG PO TBEC: 5.0000 mg | DELAYED_RELEASE_TABLET | Freq: Every day | ORAL | Status: DC | PRN

## 2021-02-04 MED ORDER — LEVALBUTEROL HCL 0.63 MG/3ML IN NEBU
0.6300 mg | INHALATION_SOLUTION | Freq: Four times a day (QID) | RESPIRATORY_TRACT | Status: DC | PRN
Start: 2021-02-04 — End: 2021-02-05
  Filled 2021-02-04: qty 3

## 2021-02-04 MED ORDER — ONDANSETRON HCL 4 MG/2ML IJ SOLN: 4.0000 mg | Freq: Four times a day (QID) | INTRAMUSCULAR | Status: DC | PRN

## 2021-02-04 MED ORDER — IPRATROPIUM BROMIDE 0.02 % IN SOLN: 0.5000 mg | Freq: Four times a day (QID) | RESPIRATORY_TRACT | Status: DC | PRN

## 2021-02-04 MED ORDER — ACETAMINOPHEN 325 MG PO TABS: 650.0000 mg | ORAL_TABLET | Freq: Four times a day (QID) | ORAL | Status: DC | PRN

## 2021-02-04 MED ORDER — SODIUM CHLORIDE 0.9% FLUSH: 3.0000 mL | INTRAVENOUS | Status: DC | PRN

## 2021-02-04 NOTE — ED Notes (Signed)
Attempted to give report twice, [facility] is on way.

## 2021-02-04 NOTE — H&P (Signed)
History and Physical   Patient: Hannah Bates                            PCP: [name], MD                    DOB: August 27, 1933            DOA: 02/03/2021 [id number]             DOS: 02/04/2021, 11:27 AM  Patient coming from:   HOME  I have personally reviewed patient's medical records, in electronic medical records, including:  [place] link, and care everywhere.   PCP: [name], MD    Chief Complaint:   Chief Complaint  Patient presents with   Nausea   Headache   Covid Positive    History of present illness:    Hannah Bates is a 87 y.o. female with medical history significant of osteoarthritis, osteoporosis, PVCs, hyperlipidemia, recent history of COVID infection 8 days ago, s/p treatment, presented with headaches nausea vomiting.  Denied of having any visual disturbances or asymmetric weaknesses. Work-up at droppage which included CT of the head followed by CT venogram confirmed venous sinus thrombosis.   Patient Denies having: Fever, Chills, Cough, SOB, Chest Pain, Abd pain, N/V/D, headache, dizziness, lightheadedness,  Dysuria, Joint pain, rash, open wounds  ED Course:   Blood pressure (!) 154/62, pulse 76, temperature 97.8 F (36.6 C), temperature source Oral, resp. rate 18, height 5' (1.524 m), weight 67.8 kg, SpO2 95 %.  Abnormal labs; CBC CMP within normal limits, CT of the head followed by CT venogram confirming venous sinus thrombosis. SARS-CoV-2 still positive     Review of Systems: As per HPI, otherwise 10 point review of systems were negative.   ----------------------------------------------------------------------------------------------------------------------  Allergies  Allergen Reactions   Codeine     REACTION: Rash   Sulfonamide Derivatives     REACTION: Rash   Other Rash    Event monitor leads need....sensitive    Home MEDs:  Prior to Admission medications   Medication Sig Start Date End Date  Taking? Authorizing Provider  atorvastatin (LIPITOR) 20 MG tablet Take 20 mg by mouth daily.   Yes [provider]  Biotin 10000 MCG TABS Take 1 tablet by mouth daily.   Yes [provider]  calcium citrate (CALCITRATE - DOSED IN MG ELEMENTAL CALCIUM) 950 MG tablet Take 200 mg of elemental calcium by mouth daily.   Yes [provider]  Cholecalciferol (VITAMIN D3) 5000 units CAPS Take 5,000 Units by mouth daily.   Yes [provider]  famotidine (PEPCID) 20 MG tablet Take 20 mg by mouth daily.   Yes [provider]  Loratadine (CLARITIN PO) Take 1 tablet by mouth daily.   Yes [provider]  Multiple Vitamins-Minerals (MULTIVITAMIN WITH MINERALS) tablet Take 1 tablet by mouth daily.   Yes [provider]  Acetaminophen (TYLENOL PO) Take 1,000 mg by mouth daily as needed (pain/headache).     [provider]  estradiol (ESTRACE) 0.1 MG/GM vaginal cream PLACE A PEA- SIZED AMOUNT IN THE VAGINA NIGHTLY FOR 2 WEEKS, THEN USE EVERY OTHER NIGHT 09/09/20   [provider]  omeprazole (PRILOSEC) 20 MG capsule 1 capsule 30 minutes before morning meal 01/13/15   [provider]  triamcinolone cream (KENALOG) 0.1 % Apply 1 application topically 2 (two) times daily. 12/22/19   Vanessa Kick, MD  PRN MEDs: sodium chloride, acetaminophen **OR** acetaminophen, bisacodyl, hydrALAZINE, ipratropium, levalbuterol, ondansetron **OR** ondansetron (ZOFRAN) IV, senna-docusate, sodium chloride flush, traZODone  Past Medical History:  Diagnosis Date   Arthritis    Osteoporosis    Palpitations    PVC (premature ventricular contraction)     Past Surgical History:  Procedure Laterality Date   BREAST CYST ASPIRATION Bilateral    CHOLECYSTECTOMY     EYE SURGERY     FINGER GANGLION CYST EXCISION     rt thumb     reports that she has never smoked. She has never used smokeless tobacco. She reports that she does not drink alcohol  and does not use drugs.   Family History  Problem Relation Age of Onset   Hyperlipidemia Mother    Heart disease Father    Hyperlipidemia Father    Hypertension Father    Lung disease Father        black lung- miner    Cancer Sister    Heart disease Sister    Hyperlipidemia Sister    Hypertension Sister    Breast cancer Other     Physical Exam:   Vitals:   02/04/21 0209 02/04/21 0345 02/04/21 0521 02/04/21 0800  BP:   135/72 (!) 154/62  Pulse:   82 76  Resp:   19 18  Temp:  97.7 F (36.5 C) 97.7 F (36.5 C) 97.8 F (36.6 C)  TempSrc:  Oral Oral Oral  SpO2: 90%  95% 95%  Weight:      Height:       Constitutional: NAD, calm, comfortable Eyes: PERRL, lids and conjunctivae normal ENMT: Mucous membranes are moist. Posterior pharynx clear of any exudate or lesions.Normal dentition.  Neck: normal, supple, no masses, no thyromegaly Respiratory: clear to auscultation bilaterally, no wheezing, no crackles. Normal respiratory effort. No accessory muscle use.  Cardiovascular: Regular rate and rhythm, no murmurs / rubs / gallops. No extremity edema. 2+ pedal pulses. No carotid bruits.  Abdomen: no tenderness, no masses palpated. No hepatosplenomegaly. Bowel sounds positive.  Musculoskeletal: no clubbing / cyanosis. No joint deformity upper and lower extremities. Good ROM, no contractures. Normal muscle tone.  Neurologic: CN II-XII grossly intact. Sensation intact, DTR normal. Strength 5/5 in all 4.  Psychiatric: Normal judgment and insight. Alert and oriented x 3. Normal mood.  Skin: no rashes, lesions, ulcers. No induration Wounds: per nursing documentation         Labs on admission:    I have personally reviewed following labs and imaging studies  CBC: Recent Labs  Lab 02/03/21 2122 02/04/21 0852  WBC 10.6* 8.1  NEUTROABS 6.8  --   HGB 15.0 13.9  HCT 44.4 42.0  MCV 97.2 97.9  PLT 286 [id number]   Basic Metabolic Panel: Recent Labs  Lab 02/03/21 2122  NA 138  K  3.5  CL 103  CO2 21*  GLUCOSE 142*  BUN 18  CREATININE 0.88  CALCIUM 8.9   GFR: Estimated Creatinine Clearance: 40.9 mL/min (by C-G formula based on SCr of 0.88 mg/dL). Liver Function Tests: Recent Labs  Lab 02/03/21 2122  AST 23  ALT 16  ALKPHOS 44  BILITOT 0.6  PROT 7.0  ALBUMIN 4.0   No results for input(s): LIPASE, AMYLASE in the last 168 hours. No results for input(s): AMMONIA in the last 168 hours. Coagulation Profile: Recent Labs  Lab 02/03/21 2251  INR 0.9   Cardiac Enzymes: No results for input(s): CKTOTAL, CKMB, CKMBINDEX, TROPONINI in the last 168 hours.  BNP (last 3 results) Recent Labs    11/18/20 1424  PROBNP 151   HbA1C: No results for input(s): HGBA1C in the last 72 hours. CBG: Recent Labs  Lab 02/04/21 0803  GLUCAP 114*   Lipid Profile: No results for input(s): CHOL, HDL, LDLCALC, TRIG, CHOLHDL, LDLDIRECT in the last 72 hours. Thyroid Function Tests: No results for input(s): TSH, T4TOTAL, FREET4, T3FREE, THYROIDAB in the last 72 hours. Anemia Panel: No results for input(s): VITAMINB12, FOLATE, FERRITIN, TIBC, IRON, RETICCTPCT in the last 72 hours. Urine analysis: No results found for: COLORURINE, APPEARANCEUR, LABSPEC, [place], GLUCOSEU, HGBUR, BILIRUBINUR, KETONESUR, PROTEINUR, UROBILINOGEN, NITRITE, LEUKOCYTESUR   Radiologic Exams on Admission:   CT HEAD WO CONTRAST (5MM)  Result Date: 02/03/2021 CLINICAL DATA:  Pain, headache today. EXAM: CT HEAD WITHOUT CONTRAST TECHNIQUE: Contiguous axial images were obtained from the base of the skull through the vertex without intravenous contrast. COMPARISON:  None. FINDINGS: Brain: There is thickening and hyperdensity involving the straight sinus suspicious for thrombus. No definite hemorrhage. Normal brain volume for age. Minimal periventricular and deep chronic small vessel ischemia. No evidence of acute infarct. No hydrocephalus. No midline shift. Vascular: Thickening and hyperdensity involving the  straight sinus suspicious for thrombus. No evidence of hyperdense MCA. Mild carotid atherosclerosis at the skull base. Skull: No fracture or focal lesion. Sinuses/Orbits: Paranasal sinuses and mastoid air cells are clear. The visualized orbits are unremarkable. Bilateral cataract resection Other: None. IMPRESSION: Thickening and hyperdensity involving the straight sinus suspicious for thrombus. No visualized venous infarct. Recommend further evaluation with CTV. Electronically Signed   By: Keith Rake M.D.   On: 02/03/2021 21:51   CT VENOGRAM HEAD  Result Date: 02/03/2021 CLINICAL DATA:  Headache with abnormal head CT EXAM: CT VENOGRAM HEAD TECHNIQUE: Venographic phase images of the brain were obtained following the administration of intravenous contrast. Multiplanar reformats and maximum intensity projections were generated. CONTRAST:  35m OMNIPAQUE IOHEXOL 350 MG/ML SOLN COMPARISON:  Head CT 02/03/2021 FINDINGS: Superior sagittal sinus: Normal. Straight sinus: Thrombosed Inferior sagittal sinus, vein of Galen and internal cerebral veins: The vein of Galen is thrombosed. Internal cerebral veins and inferior sagittal sinus are patent Transverse sinuses: Normal. Sigmoid sinuses: Normal. Visualized jugular veins: Normal. IMPRESSION: Thrombosis of the straight sinus and vein of Galen. Electronically Signed   By: KUlyses JarredM.D.   On: 02/03/2021 22:47    EKG:   Independently reviewed.  Orders placed or performed during the hospital encounter of 02/03/21   ED EKG   ED EKG   EKG 12-Lead   ---------------------------------------------------------------------------------------------------------------------------------------    Assessment / Plan:   Principal Problem:   Cerebral venous thrombosis Active Problems:   GERD   Osteoporosis   PVC's (premature ventricular contractions)   Thrombosis   COVID-19 virus infection   Principal Problem:   Cerebral venous thrombosis -We will continue  to monitor very closely -Neurology consulted -Currently per recommendation on heparin drip, will be transition to Lovenox vs DOVC Further anticoagulation recommendation per neurology, stroke team -CT of the head, CT venogram reviewed as below confirming straight sinus venous thrombosis. -Further work-up per neurology recommendation ?  MRI/MRA of brain,   Active Problems:    GERD -resuming PPI    Osteoporosis -continue home medication of vitamin D supplements   PVC's (premature ventricular contractions) -we will monitor closely Hyperlipidemia-continue statins COVID-19 virus infection -status post treatment as outpatient with Paxlovid,  currently stable, satting 95% room air  Cultures:  -none  Antimicrobial: -none   Consults called:  Neurology -------------------------------------------------------------------------------------------------------------------------------------------- DVT prophylaxis: Heparin drip Code Status:   Code Status: Full Code   Admission status: Patient will be admitted as Inpatient, with a greater than 2 midnight length of stay. Level of care: Telemetry Medical   Family Communication:  none at bedside  (The above findings and plan of care has been discussed with patient in detail, the patient expressed understanding and agreement of above plan)  --------------------------------------------------------------------------------------------------------------------------------------------------  Disposition Plan:  Anticipated 1-2 days Status is: Inpatient  Remains inpatient appropriate because:Inpatient level of care appropriate due to severity of illness  Dispo: The patient is from: Home              Anticipated d/c is to: Home              Patient currently is not medically stable to d/c.   Difficult to place patient  No         ----------------------------------------------------------------------------------------------------------------------------------------------------  Time spent: > than  55  Min.   SIGNED: Deatra James, MD, FHM. [facility],  Pager (Please use [email or web address] to page to text)  If 7PM-7AM, please contact night-coverage [email or web address],  02/04/2021, 11:27 AM

## 2021-02-04 NOTE — TOC Benefit Eligibility Note (Signed)
Transition of Care [facility]) Benefit Eligibility Note    Patient Details  Name: ISABELE FAM MRN: [id number] Date of Birth: [birth year]   Medication/Dose: Enoxaparin '60mg'$ . inj. bid for 30 day supply  Covered?: Yes  Tier:  (4)  Prescription Coverage Preferred Pharmacy: [facility] with Person/Company/Phone Number:: Joylene Igo. Blase Mess PH# [phone]  Co-Pay: $8.72  Prior Approval: No  Deductible:  (no deductible)       Shelda Altes Phone Number: 02/04/2021, 11:26 AM

## 2021-02-04 NOTE — Consult Note (Addendum)
Neurology Consultation Reason for Consult: CVT Referring Physician: Vernell Barrier, A  CC: Headache  History is obtained from:patient  HPI: Hannah Bates is a 87 y.o. female who presents with headaches, nausea and vomiting that started at 3 PM today.  She was diagnosed with COVID a week ago after having a runny nose and being exposed.  She developed a sore throat, but has been doing well over the past couple of days.  Today, she started having nausea and vomiting after headache started around 3 PM.  For this reason she sought care in the [place] [facility] where a CT was performed with findings concerning for a straight sinus thrombosis.  CT venogram was performed which confirmed that this was indeed a VST.  She was started on heparin and within a few hours of starting heparin, her headache has essentially resolved.   ROS: A 14 point ROS was performed and is negative except as noted in the HPI.  Past Medical History:  Diagnosis Date   Arthritis    Osteoporosis    Palpitations    PVC (premature ventricular contraction)      Family History  Problem Relation Age of Onset   Hyperlipidemia Mother    Heart disease Father    Hyperlipidemia Father    Hypertension Father    Lung disease Father        black lung- miner    Cancer Sister    Heart disease Sister    Hyperlipidemia Sister    Hypertension Sister    Breast cancer Other      Social History:  reports that she has never smoked. She has never used smokeless tobacco. She reports that she does not drink alcohol and does not use drugs.   Exam: Current vital signs: BP 135/72 (BP Location: Right Arm)   Pulse 82   Temp 97.7 F (36.5 C) (Oral)   Resp 19   Ht 5' (1.524 m)   Wt 67.8 kg   SpO2 95%   BMI 29.19 kg/m  Vital signs in last 24 hours: Temp:  [97.7 F (36.5 C)] 97.7 F (36.5 C) (08/03 0521) Pulse Rate:  [66-102] 82 (08/03 0521) Resp:  [16-19] 19 (08/03 0521) BP: (135-167)/(64-80) 135/72 (08/03 0521) SpO2:   [90 %-99 %] 95 % (08/03 0521) Weight:  [67.8 kg] 67.8 kg (08/02 2108)   Physical Exam  Constitutional: Appears well-developed and well-nourished.  Psych: Affect appropriate to situation Eyes: No scleral injection HENT: No OP obstruction MSK: no joint deformities.  Cardiovascular: Normal rate and regular rhythm.  Respiratory: Effort normal, non-labored breathing GI: Soft.  No distension. There is no tenderness.  Skin: WDI  Neuro: Mental Status: Patient is awake, alert, oriented to person, place, month, year, and situation. Patient is able to give a clear and coherent history. No signs of aphasia or neglect Cranial Nerves: II: Visual Fields are full. Pupils are equal, round, and reactive to light.   III,IV, VI: EOMI without ptosis or diploplia.  V: Facial sensation is symmetric to temperature VII: Facial movement is symmetric.  VIII: hearing is intact to voice X: Uvula elevates symmetrically XI: Shoulder shrug is symmetric. XII: tongue is midline without atrophy or fasciculations.  Motor: Tone is normal. Bulk is normal. 5/5 strength was present in all four extremities.  Sensory: Sensation is symmetric to light touch and temperature in the arms and legs. Cerebellar: FNF are intact bilaterally    I have reviewed labs in epic and the results pertinent to this consultation  are: BMP is unremarkable  I have reviewed the images obtained: CT/CTV-thrombosis in the straight sinus, though there does appear to be some flow around the edges  Impression: 87 year old female with cerebral venous sinus thrombosis.  She has improved already on heparin.  I would favor continuing heparin through tomorrow at that point could change to Lovenox.  For longer term treatment, would consider Pradaxa.  I suspect that this is due to COVID as it is known to cause a hypercoagulable state.  Recommendations: 1) continue heparin overnight tonight, then can change to Lovenox as long as exam remained  stable 2) stroke team to follow-up   Roland Rack, MD [facility] [phone]  If 7pm- 7am, please page neurology on call as listed in [place].

## 2021-02-04 NOTE — Progress Notes (Addendum)
STROKE TEAM PROGRESS NOTE    Interval History   No acute events overnight, patient states that her headache and dizziness have resolved. She feels that she is at her baseline. She states that prior to this hospitalization she was living at home and able to do all her ADLs independently.   She was informed about cerebral venous sinus thrombosis, treatment and that she can be transitioned to Pradaxa which she is to take for 6-9 months.   All questions were answered.   Patient has acute COVID infection has not mildly symptomatic with mild upper respiratory tract symptoms  Pertinent Lab Work and Imaging    02/03/21 CT Head WO IV Contrast Thickening and hyperdensity involving the straight sinus suspicious for thrombus. No visualized venous infarct. Recommend further evaluation with CTV.  02/03/21 CT Venogram Head  Thrombosis of the straight sinus and vein of Galen.  02/04/21 MRI Brain WO IV Contrast Mild nonspecific white matter changes related to small vessel disease.  No acute abnormality  02/04/21 MR Venogram Head  Thrombosis of straight sinus and vein of Galen.  No filling defect in the superficial sagittal sinus.  11/11/20 Echocardiogram Complete   1. Left ventricular ejection fraction, by estimation, is 60 to 65%. The left ventricle has normal function. The left ventricle has no regional wall motion abnormalities. Left ventricular diastolic parameters are consistent with Grade I diastolic dysfunction (impaired relaxation). The average left ventricular global longitudinal strain is -20.0 %.   2. Right ventricular systolic function is normal. The right ventricular size is normal. There is mildly elevated pulmonary artery systolic pressure. The estimated right ventricular systolic pressure is Q000111Q mmHg.   3. The mitral valve is normal in structure. No evidence of mitral valve regurgitation.   4. The aortic valve is tricuspid. Aortic valve regurgitation is not visualized. Mild aortic valve  sclerosis is present, with no evidence of aortic valve stenosis.   5. The inferior vena cava is normal in size with greater than 50% respiratory variability, suggesting right atrial pressure of 3 mmHg.  Physical Examination   Constitutional: Calm, appropriate for condition  Cardiovascular: Normal RR Respiratory: No increased WOB   Mental status: AAOx4 Speech: Fluent with repetition and naming intact  Cranial nerves: EOMI, VFF, Face symmetric, Tongue midline, Shoulder shrug intact  Motor: Normal bulk and tone. No drift. Strength 5/5 throughout  Sensory: Intact to light touch throughout  Coordination: Intact FNF + HTS  Gait: Deferred  NIHSS: 0  Assessment and Plan   [name] is a 87 y.o. female w/pmh of osteoarthritis, osteoporosis, PVCs, hyperlipidemia, recent history of COVID infection 8 days ago, s/p treatment who presents with headache, nausea and vomiting found to have CVST.   #Cerebral Venous Sinus Thrombosis  Patient presented with the symptoms described above. At this time, her work up is ongoing. CTH was concerning for thickening and hyperdensity involving the straight sinus. CT Venogram showed thrombosis of the straight sinus and vein of Galen. Are waiting on MRI/MRV results ordered for further evaluation of CVST/to rule out stroke that could be caused by CVST. Have initiated heparin for CVST treatment. CVST etiology could possibly be due to recent Covid infection causing a hypercoagulable state.   MRI and MRV results confirm thrombosis of inferior sagittal sinus straight sinus and vein of Galen - Transition heparin to Pradaxa 150 mg BID. Duration 6-9 months, total duration will be determined in outpatient stroke clinic  - Referral placed for stroke follow up   Hospital day #  0  Ruta Hinds, NP  [facility] Nurse Practitioner Patient seen and discussed with attending physician Dr. Leonie Man   Stroke MD Note : I have personally obtained  history,examined this patient, reviewed notes, independently viewed imaging studies, participated in medical decision making and plan of care.ROS completed by me personally and pertinent positives fully documented  I have made any additions or clarifications directly to the above note. Agree with note above.  Patient presented with sudden headache nausea and vomiting secondary to straight sinus and deep cerebral vein thrombosis and has shown significant improvement after starting anticoagulation with IV heparin.  Recommend switching heparin to Pradaxa for 6 to 9 months and maintain aggressive hydration.  Treatment of COVID as per primary team.  Long discussion with the patient and care team and answered questions.  Greater than 50% time during this 35-minute visit was spent on counseling and coordination of care for the venous sinus thrombosis and COVID infection and answering questions.  Stroke team will sign off.  Kindly call for questions  Antony Contras, MD Medical Director [place] Pager: [phone] 02/04/2021 5:01 PM   To contact Stroke Continuity provider, please refer to [email or web address] After hours, contact General Neurology

## 2021-02-04 NOTE — Progress Notes (Signed)
ANTICOAGULATION CONSULT NOTE - Initial Consult  Pharmacy Consult for Heparin>>Pradaxa Indication:  venous sinus thrombosis  Allergies  Allergen Reactions   Codeine     REACTION: Rash   Sulfonamide Derivatives     REACTION: Rash   Other Rash    Event monitor leads need....sensitive    Patient Measurements: Height: 5' (152.4 cm) Weight: 67.8 kg (149 lb 7.6 oz) IBW/kg (Calculated) : 45.5  Vital Signs: Temp: 98 F (36.7 C) (08/03 1203) Temp Source: Oral (08/03 1203) BP: 171/89 (08/03 1203) Pulse Rate: 80 (08/03 1203)  Labs: Recent Labs    02/03/21 2122 02/03/21 2251 02/04/21 0852  HGB 15.0  --  13.9  HCT 44.4  --  42.0  PLT 286  --  259  APTT  --  26  --   LABPROT  --  12.1  --   INR  --  0.9  --   HEPARINUNFRC  --   --  0.94*  CREATININE 0.88  --   --     Estimated Creatinine Clearance: 40.9 mL/min (by C-G formula based on SCr of 0.88 mg/dL).   Medical History: Past Medical History:  Diagnosis Date   Arthritis    Osteoporosis    Palpitations    PVC (premature ventricular contraction)    Assessment: Anticoag: new cerebral sinus thrombosis on CT venogram > hep gtt (no bolus). Hep level 0.94 unexpectedly high. Dose decreased Discussed anticoag with primary and stroke MD. Start Pradaxa tonight.  Goal of Therapy:  Therapeutic oral anticoagulation Monitor platelets by anticoagulation protocol: Yes   Plan:  Decrease IV heparin to 600 units/hr>>Pradaxa '150mg'$  BID (CrCl 50.9) starting this PM.   Hannah Bates S. Hannah Bates, PharmD, BCPS Clinical Staff Pharmacist [email or web address] Hannah Bates, [employer] 02/04/2021,1:38 PM

## 2021-02-04 NOTE — Discharge Instructions (Addendum)

## 2021-02-04 NOTE — Plan of Care (Signed)
None

## 2021-02-04 NOTE — Progress Notes (Signed)

## 2021-02-04 NOTE — ED Notes (Signed)
Attemp to give report to [id number].

## 2021-02-05 ENCOUNTER — Other Ambulatory Visit (HOSPITAL_COMMUNITY): Payer: Self-pay

## 2021-02-05 DIAGNOSIS — U071 COVID-19: Secondary | ICD-10-CM

## 2021-02-05 LAB — BASIC METABOLIC PANEL
Anion gap: 9 (ref 5–15)
BUN: 10 mg/dL (ref 8–23)
CO2: 24 mmol/L (ref 22–32)
Calcium: 8.6 mg/dL — ABNORMAL LOW (ref 8.9–10.3)
Chloride: 105 mmol/L (ref 98–111)
Creatinine, Ser: 0.81 mg/dL (ref 0.44–1.00)
GFR, Estimated: >60 mL/min (ref >60)
Glucose, Bld: 94 mg/dL (ref 70–99)
Potassium: 3.2 mmol/L — ABNORMAL LOW (ref 3.5–5.1)
Sodium: 138 mmol/L (ref 135–145)

## 2021-02-05 LAB — GLUCOSE, CAPILLARY: Glucose-Capillary: 97 mg/dL (ref 70–99)

## 2021-02-05 MED ORDER — DABIGATRAN ETEXILATE MESYLATE 150 MG PO CAPS
150.0000 mg | ORAL_CAPSULE | Freq: Two times a day (BID) | ORAL | 0 refills | Status: DC
  Filled 2021-02-05: qty 60, 30d supply, fill #0

## 2021-02-05 MED ORDER — AMLODIPINE BESYLATE 5 MG PO TABS
5.0000 mg | ORAL_TABLET | Freq: Every day | ORAL | Status: DC
  Administered 2021-02-05: 5 mg via ORAL
  Filled 2021-02-05: qty 1

## 2021-02-05 MED ORDER — POTASSIUM CHLORIDE CRYS ER 20 MEQ PO TBCR
40.0000 meq | EXTENDED_RELEASE_TABLET | Freq: Four times a day (QID) | ORAL | Status: DC
  Administered 2021-02-05: 40 meq via ORAL
  Filled 2021-02-05: qty 2

## 2021-02-05 MED ORDER — APIXABAN 5 MG PO TABS
5.0000 mg | ORAL_TABLET | Freq: Two times a day (BID) | ORAL | 1 refills | Status: DC
  Filled 2021-02-05: qty 60, 30d supply, fill #0

## 2021-02-05 NOTE — Progress Notes (Signed)
OT Cancellation Note  Patient Details Name: Hannah Bates MRN: [id number] DOB: 1933/12/12   Cancelled Treatment:    Reason Eval/Treat Not Completed: OT screened, no needs identified, will sign off.  Malka So 02/05/2021, 12:04 PM Nestor Lewandowsky, OTR/L Acute Rehabilitation Services Pager: [phone] Office: [phone]

## 2021-02-05 NOTE — Progress Notes (Signed)
PT Cancellation Note  Patient Details Name: Hannah Bates MRN: [id number] DOB: 07-Oct-1933   Cancelled Treatment:    Reason Eval/Treat Not Completed: PT screened, no needs identified, will sign off. Pt is at baseline and mobilizing independently in room without difficult. No dizziness or instability. Pt eager to go home.    Shary Decamp [facility] 02/05/2021, 9:50 AM [place] Pager [phone] Office [phone]

## 2021-02-05 NOTE — Plan of Care (Signed)
  Problem: Clinical Measurements: Goal: Ability to maintain clinical measurements within normal limits will improve Outcome: Adequate for Discharge Goal: Will remain free from infection Outcome: Adequate for Discharge Goal: Diagnostic test results will improve Outcome: Adequate for Discharge Goal: Respiratory complications will improve Outcome: Adequate for Discharge Goal: Cardiovascular complication will be avoided Outcome: Adequate for Discharge   Problem: Activity: Goal: Risk for activity intolerance will decrease Outcome: Adequate for Discharge   Problem: Nutrition: Goal: Adequate nutrition will be maintained Outcome: Adequate for Discharge   Problem: Education: Goal: Knowledge of risk factors and measures for prevention of condition will improve Outcome: Adequate for Discharge   Problem: Coping: Goal: Psychosocial and spiritual needs will be supported Outcome: Adequate for Discharge   Problem: Respiratory: Goal: Will maintain a patent airway Outcome: Adequate for Discharge Goal: Complications related to the disease process, condition or treatment will be avoided or minimized Outcome: Adequate for Discharge

## 2021-02-05 NOTE — Discharge Summary (Signed)
Physician Discharge Summary  [name] [id number] DOB: February 07, 1934 DOA: 02/03/2021  PCP: [name], MD  Admit date: 02/03/2021 Discharge date: 02/05/2021  Admitted From: Home Disposition:  Home   Recommendations for Outpatient Follow-up:  Follow up with PCP in 1-2 weeks Please obtain BMP/CBC in one week   Home Health:NO Equipment/Devices:No  Discharge Condition:Stable CODE STATUS:FULL Diet recommendation: Heart Healthy  Brief/Interim Summary:  Cerebral vein sinus thrombosis -Neurology input greatly appreciated, patient presented with sudden headache, nausea and vomiting, secondary to straight sinus and deep cerebral vein thrombosis, she is with significant improvement after starting her on heparin GTT, current recommendation is to transition to NOAC, initially patient was started on Pradaxa during hospital stay, but given her insurance coverage, she will be discharged on Eliquis, she will need anticoagulation for 6 to 71-month 30-day supply of medication was provided at time of discharge, patient to follow with stroke team as an outpatient.  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  GERD -resuming PPI     Osteoporosis -continue home medication of vitamin D supplements    PVC's  Hyperlipidemia-continue statins  COVID-19 virus infection -status post treatment as outpatient with Paxlovid,  currently stable, satting 95% room air     Discharge Diagnoses:  Principal Problem:   Cerebral venous thrombosis Active Problems:   GERD   Osteoporosis   PVC's (premature ventricular contractions)   Thrombosis   COVID-19 virus infection    Discharge Instructions  Discharge Instructions     Ambulatory referral to  Neurology   Complete by: As directed    An appointment is requested in approximately: 4-6 weeks   Diet - low sodium heart healthy   Complete by: As directed    Discharge instructions   Complete by: As directed    Follow with Primary MD TGlenis Smoker MD in 10 days   Get CBC, CMP, checked  by Primary MD next visit.    Activity: As tolerated with Full fall precautions use walker/cane & assistance as needed   Disposition Home    Diet: Heart Healthy , with feeding assistance and aspiration precautions.   On your next visit with your primary care physician please Get Medicines reviewed and adjusted.   Please request your Prim.MD to go over all Hospital Tests and Procedure/Radiological results at the follow up, please get all Hospital records sent to your Prim MD by signing hospital release before you go home.   If you experience worsening of your admission symptoms, develop shortness of breath, life threatening emergency, suicidal or homicidal thoughts you must seek medical attention immediately by calling 911 or calling your MD immediately  if symptoms less severe.  You Must read complete instructions/literature along with all the possible adverse reactions/side effects for all the Medicines you take and that have been prescribed to you. Take any new Medicines after you have completely understood and accpet all the possible adverse reactions/side effects.   Do not drive, operating heavy machinery, perform activities at heights, swimming or participation in water activities or provide baby sitting services if your were admitted for syncope or siezures until you have seen by Primary MD or a Neurologist and advised to do so again.  Do not drive when taking Pain medications.    Do not take  more than prescribed Pain, Sleep and Anxiety Medications  Special Instructions: If you have smoked or chewed Tobacco  in the last 2 yrs please stop smoking, stop any regular Alcohol  and or  any Recreational drug use.  Wear Seat belts while driving.   Please note  You were cared for by a hospitalist during your hospital stay. If you have any questions about your discharge medications or the care you received while you were in the hospital after you are discharged, you can call the unit and asked to speak with the hospitalist on call if the hospitalist that took care of you is not available. Once you are discharged, your primary care physician will handle any further medical issues. Please note that NO REFILLS for any discharge medications will be authorized once you are discharged, as it is imperative that you return to your primary care physician (or establish a relationship with a primary care physician if you do not have one) for your aftercare needs so that they can reassess your need for medications and monitor your lab values.   Increase activity slowly   Complete by: As directed       Allergies as of 02/05/2021       Reactions   Codeine    REACTION: Rash   Sulfonamide Derivatives    REACTION: Rash   Other Rash   Event monitor leads need....sensitive        Medication List     TAKE these medications    atorvastatin 20 MG tablet Commonly known as: LIPITOR Take 20 mg by mouth daily.   Biotin 10000 MCG Tabs Take 10,000 mcg by mouth daily.   calcium citrate 950 (200 Ca) MG tablet Commonly known as: CALCITRATE - dosed in mg elemental calcium Take 200 mg of elemental calcium by mouth daily.   CLARITIN PO Take 1 tablet by mouth daily.   Eliquis 5 MG Tabs tablet Generic drug: apixaban Take 1 tablet (5 mg total) by mouth 2 (two) times daily.   estradiol 0.1 MG/GM vaginal cream Commonly known as: ESTRACE PLACE A PEA- SIZED AMOUNT IN THE VAGINA NIGHTLY FOR 2 WEEKS, THEN USE EVERY OTHER NIGHT   famotidine 20 MG tablet Commonly known as: PEPCID Take 20 mg by mouth daily.   multivitamin with minerals tablet Take 1 tablet by mouth daily.   omeprazole 20  MG capsule Commonly known as: PRILOSEC 1 capsule 30 minutes before morning meal   triamcinolone cream 0.1 % Commonly known as: KENALOG Apply 1 application topically 2 (two) times daily.   TYLENOL PO Take 1,000 mg by mouth daily as needed (pain/headache).   Vitamin D3 125 MCG (5000 UT) Caps Take 5,000 Units by mouth daily.        Follow-up Information     [name], MD Follow up.   Specialty: Family Medicine Contact information: [address] [place] [address] [phone]                Allergies  Allergen Reactions   Codeine     REACTION: Rash   Sulfonamide Derivatives     REACTION: Rash   Other Rash    Event monitor leads need....sensitive    Consultations: neurology   Procedures/Studies: CT HEAD WO CONTRAST (5MM)  Result Date: 02/03/2021 CLINICAL DATA:  Pain, headache today. EXAM: CT HEAD WITHOUT CONTRAST TECHNIQUE: Contiguous axial images were obtained from the base of the skull through the vertex without intravenous contrast. COMPARISON:  None. FINDINGS: Brain: There  is thickening and hyperdensity involving the straight sinus suspicious for thrombus. No definite hemorrhage. Normal brain volume for age. Minimal periventricular and deep chronic small vessel ischemia. No evidence of acute infarct. No hydrocephalus. No midline shift. Vascular: Thickening and hyperdensity involving the straight sinus suspicious for thrombus. No evidence of hyperdense MCA. Mild carotid atherosclerosis at the skull base. Skull: No fracture or focal lesion. Sinuses/Orbits: Paranasal sinuses and mastoid air cells are clear. The visualized orbits are unremarkable. Bilateral cataract resection Other: None. IMPRESSION: Thickening and hyperdensity involving the straight sinus suspicious for thrombus. No visualized venous infarct. Recommend further evaluation with CTV. Electronically Signed   By: Keith Rake M.D.   On: 02/03/2021 21:51   MR BRAIN W WO  CONTRAST  Result Date: 02/04/2021 CLINICAL DATA:  Suspect dural venous sinus thrombosis.  Headache EXAM: MRI HEAD WITHOUT AND WITH CONTRAST MR VENOGRAM HEAD WITHOUT AND WITH CONTRAST TECHNIQUE: Multiplanar, multi-echo pulse sequences of the brain and surrounding structures were acquired without and with intravenous contrast. Angiographic images of the intracranial venous structures were acquired using MRV technique without and with intravenous contrast. CONTRAST:  39m GADAVIST GADOBUTROL 1 MMOL/ML IV SOLN COMPARISON:  CT venogram head 02/03/2021 FINDINGS: MRI HEAD WITHOUT AND WITH CONTRAST Brain: Mild cerebral atrophy. Negative for hydrocephalus. Negative for acute infarct Mild patchy white matter hyperintensity on FLAIR. Brainstem and cerebellum intact. Negative for hemorrhage or mass. Vascular: Filling defect in the straight sinus compatible with venous thrombosis. Normal arterial flow voids. Skull and upper cervical spine: No focal skeletal lesion. Sinuses/Orbits: Negative Other: None MR VENOGRAM HEAD WITHOUT AND WITH CONTRAST Filling defect compatible with thrombosis in the vein of Galen and straight sinus. Relatively large amount of thrombus which appears nonocclusive. Superior sagittal sinus widely patent. Transverse sinus and sigmoid sinus patent bilaterally. IMPRESSION: 1. Thrombus in the vein of Galen and straight sinus as identified on CT venogram. Remaining dural venous sinuses are patent 2. Negative for acute infarct. Electronically Signed   By: CFranchot GalloM.D.   On: 02/04/2021 14:08   MR Venogram Head  Result Date: 02/04/2021 CLINICAL DATA:  Suspect dural venous sinus thrombosis.  Headache EXAM: MRI HEAD WITHOUT AND WITH CONTRAST MR VENOGRAM HEAD WITHOUT AND WITH CONTRAST TECHNIQUE: Multiplanar, multi-echo pulse sequences of the brain and surrounding structures were acquired without and with intravenous contrast. Angiographic images of the intracranial venous structures were acquired using MRV  technique without and with intravenous contrast. CONTRAST:  638mGADAVIST GADOBUTROL 1 MMOL/ML IV SOLN COMPARISON:  CT venogram head 02/03/2021 FINDINGS: MRI HEAD WITHOUT AND WITH CONTRAST Brain: Mild cerebral atrophy. Negative for hydrocephalus. Negative for acute infarct Mild patchy white matter hyperintensity on FLAIR. Brainstem and cerebellum intact. Negative for hemorrhage or mass. Vascular: Filling defect in the straight sinus compatible with venous thrombosis. Normal arterial flow voids. Skull and upper cervical spine: No focal skeletal lesion. Sinuses/Orbits: Negative Other: None MR VENOGRAM HEAD WITHOUT AND WITH CONTRAST Filling defect compatible with thrombosis in the vein of Galen and straight sinus. Relatively large amount of thrombus which appears nonocclusive. Superior sagittal sinus widely patent. Transverse sinus and sigmoid sinus patent bilaterally. IMPRESSION: 1. Thrombus in the vein of Galen and straight sinus as identified on CT venogram. Remaining dural venous sinuses are patent 2. Negative for acute infarct. Electronically Signed   By: ChFranchot Gallo.D.   On: 02/04/2021 14:08   CT VENOGRAM HEAD  Result Date: 02/03/2021 CLINICAL DATA:  Headache with abnormal head CT EXAM: CT VENOGRAM HEAD TECHNIQUE: Venographic phase images  of the brain were obtained following the administration of intravenous contrast. Multiplanar reformats and maximum intensity projections were generated. CONTRAST:  79m OMNIPAQUE IOHEXOL 350 MG/ML SOLN COMPARISON:  Head CT 02/03/2021 FINDINGS: Superior sagittal sinus: Normal. Straight sinus: Thrombosed Inferior sagittal sinus, vein of Galen and internal cerebral veins: The vein of Galen is thrombosed. Internal cerebral veins and inferior sagittal sinus are patent Transverse sinuses: Normal. Sigmoid sinuses: Normal. Visualized jugular veins: Normal. IMPRESSION: Thrombosis of the straight sinus and vein of Galen. Electronically Signed   By: KUlyses JarredM.D.   On:  02/03/2021 22:47     Subjective: She denies any complaints today, no headache, no new focal deficits, she is eager to go home.  Discharge Exam: Vitals:   02/05/21 0741 02/05/21 1227  BP: (!) 168/78 (!) 150/77  Pulse: 93 70  Resp: 18 16  Temp: 98.2 F (36.8 C) 98.5 F (36.9 C)  SpO2: 94% 95%   Vitals:   02/05/21 0020 02/05/21 0426 02/05/21 0741 02/05/21 1227  BP: (!) 143/77 (!) 143/57 (!) 168/78 (!) 150/77  Pulse: 76 70 93 70  Resp: '18 19 18 16  '$ Temp: 98 F (36.7 C) 98.4 F (36.9 C) 98.2 F (36.8 C) 98.5 F (36.9 C)  TempSrc: Oral Oral Oral Oral  SpO2: 93% 94% 94% 95%  Weight:      Height:        General: Pt is alert, awake, not in acute distress Cardiovascular: RRR, S1/S2 +, no rubs, no gallops Respiratory: CTA bilaterally, no wheezing, no rhonchi Abdominal: Soft, NT, ND, bowel sounds + Extremities: no edema, no cyanosis    The results of significant diagnostics from this hospitalization (including imaging, microbiology, ancillary and laboratory) are listed below for reference.     Microbiology: Recent Results (from the past 240 hour(s))  Resp Panel by RT-PCR (Flu A&B, Covid) Nasopharyngeal Swab     Status: Abnormal   Collection Time: 02/03/21 10:05 PM   Specimen: Nasopharyngeal Swab; Nasopharyngeal(NP) swabs in vial transport medium  Result Value Ref Range Status   SARS Coronavirus 2 by RT PCR POSITIVE (A) NEGATIVE Final    Comment: RESULT CALLED TO, READ BACK BY AND VERIFIED WITH: RN MARY NEEDHAM 02/03/21 AT [place]9 [place](NOTE) SARS-CoV-2 target nucleic acids are DETECTED.  The SARS-CoV-2 RNA is generally detectable in upper respiratory specimens during the acute phase of infection. Positive results are indicative of the presence of the identified virus, but do not rule out bacterial infection or co-infection with other pathogens not detected by the test. Clinical correlation with patient history and other diagnostic information is necessary to determine  patient infection status. The expected result is Negative.  Fact Sheet for Patients: hEntrepreneurPulse.com.au Fact Sheet for Healthcare Providers: hIncredibleEmployment.be This test is not yet approved or cleared by the [place] and  has been authorized for detection and/or diagnosis of SARS-CoV-2 by FDA under an Emergency Use Authorization (EUA).  This EUA will remain in effect (meaning this test can be  used) for the duration of  the COVID-19 declaration under Section 564(b)(1) of the Act, 21 U.S.C. section 360bbb-3(b)(1), unless the authorization is terminated or revoked sooner.     Influenza A by PCR NEGATIVE NEGATIVE Final   Influenza B by PCR NEGATIVE NEGATIVE Final    Comment: (NOTE) The Xpert Xpress SARS-CoV-2/FLU/RSV plus assay is intended as an aid in the diagnosis of influenza from Nasopharyngeal swab specimens and should not be used as a sole basis for treatment. Nasal washings  and aspirates are unacceptable for Xpert Xpress SARS-CoV-2/FLU/RSV testing.  Fact Sheet for Patients: EntrepreneurPulse.com.au  Fact Sheet for Healthcare Providers: IncredibleEmployment.be  This test is not yet approved or cleared by the [place] FDA and has been authorized for detection and/or diagnosis of SARS-CoV-2 by FDA under an Emergency Use Authorization (EUA). This EUA will remain in effect (meaning this test can be used) for the duration of the COVID-19 declaration under Section 564(b)(1) of the Act, 21 U.S.C. section 360bbb-3(b)(1), unless the authorization is terminated or revoked.  Performed at [facility], [address] [place], [place] [address]      Labs: BNP (last 3 results) Recent Labs    02/04/21 0732  BNP 123XX123   Basic Metabolic Panel: Recent Labs  Lab 02/03/21 2122 02/05/21 0121  NA 138 138  K 3.5 3.2*  CL 103 105  CO2 21* 24  GLUCOSE 142* 94   BUN 18 10  CREATININE 0.88 0.81  CALCIUM 8.9 8.6*   Liver Function Tests: Recent Labs  Lab 02/03/21 2122  AST 23  ALT 16  ALKPHOS 44  BILITOT 0.6  PROT 7.0  ALBUMIN 4.0   No results for input(s): LIPASE, AMYLASE in the last 168 hours. No results for input(s): AMMONIA in the last 168 hours. CBC: Recent Labs  Lab 02/03/21 2122 02/04/21 0852  WBC 10.6* 8.1  NEUTROABS 6.8  --   HGB 15.0 13.9  HCT 44.4 42.0  MCV 97.2 97.9  PLT 286 259   Cardiac Enzymes: No results for input(s): CKTOTAL, CKMB, CKMBINDEX, TROPONINI in the last 168 hours. BNP: Invalid input(s): POCBNP CBG: Recent Labs  Lab 02/04/21 0803 02/05/21 0743  GLUCAP 114* 97   D-Dimer No results for input(s): DDIMER in the last 72 hours. Hgb A1c No results for input(s): HGBA1C in the last 72 hours. Lipid Profile No results for input(s): CHOL, HDL, LDLCALC, TRIG, CHOLHDL, LDLDIRECT in the last 72 hours. Thyroid function studies No results for input(s): TSH, T4TOTAL, T3FREE, THYROIDAB in the last 72 hours.  Invalid input(s): FREET3 Anemia work up No results for input(s): VITAMINB12, FOLATE, FERRITIN, TIBC, IRON, RETICCTPCT in the last 72 hours. Urinalysis No results found for: COLORURINE, APPEARANCEUR, [place], [place], GLUCOSEU, [place], [place], [place], PROTEINUR, UROBILINOGEN, NITRITE, LEUKOCYTESUR Sepsis Labs Invalid input(s): PROCALCITONIN,  WBC,  LACTICIDVEN Microbiology Recent Results (from the past 240 hour(s))  Resp Panel by RT-PCR (Flu A&B, Covid) Nasopharyngeal Swab     Status: Abnormal   Collection Time: 02/03/21 10:05 PM   Specimen: Nasopharyngeal Swab; Nasopharyngeal(NP) swabs in vial transport medium  Result Value Ref Range Status   SARS Coronavirus 2 by RT PCR POSITIVE (A) NEGATIVE Final    Comment: RESULT CALLED TO, READ BACK BY AND VERIFIED WITH: RN MARY NEEDHAM 02/03/21 AT 2259 [place] (NOTE) SARS-CoV-2 target nucleic acids are DETECTED.  The SARS-CoV-2 RNA is generally detectable  in upper respiratory specimens during the acute phase of infection. Positive results are indicative of the presence of the identified virus, but do not rule out bacterial infection or co-infection with other pathogens not detected by the test. Clinical correlation with patient history and other diagnostic information is necessary to determine patient infection status. The expected result is Negative.  Fact Sheet for Patients: EntrepreneurPulse.com.au  Fact Sheet for Healthcare Providers: IncredibleEmployment.be  This test is not yet approved or cleared by the [place] FDA and  has been authorized for detection and/or diagnosis of SARS-CoV-2 by FDA under an Emergency Use Authorization (EUA).  This EUA will  remain in effect (meaning this test can be  used) for the duration of  the COVID-19 declaration under Section 564(b)(1) of the Act, 21 U.S.C. section 360bbb-3(b)(1), unless the authorization is terminated or revoked sooner.     Influenza A by PCR NEGATIVE NEGATIVE Final   Influenza B by PCR NEGATIVE NEGATIVE Final    Comment: (NOTE) The Xpert Xpress SARS-CoV-2/FLU/RSV plus assay is intended as an aid in the diagnosis of influenza from Nasopharyngeal swab specimens and should not be used as a sole basis for treatment. Nasal washings and aspirates are unacceptable for Xpert Xpress SARS-CoV-2/FLU/RSV testing.  Fact Sheet for Patients: EntrepreneurPulse.com.au  Fact Sheet for Healthcare Providers: IncredibleEmployment.be  This test is not yet approved or cleared by the [place] FDA and has been authorized for detection and/or diagnosis of SARS-CoV-2 by FDA under an Emergency Use Authorization (EUA). This EUA will remain in effect (meaning this test can be used) for the duration of the COVID-19 declaration under Section 564(b)(1) of the Act, 21 U.S.C. section 360bbb-3(b)(1), unless the authorization  is terminated or revoked.  Performed at [facility], [address], [place], [place] [address]      Time coordinating discharge: Over 30 minutes  SIGNED:   Phillips Climes, MD  [facility] 02/05/2021, 2:11 PM Pager   If 7PM-7AM, please contact night-coverage [email or web address] Password [email or web address]

## 2021-02-06 ENCOUNTER — Other Ambulatory Visit: Payer: Self-pay

## 2021-02-06 DIAGNOSIS — G08 Intracranial and intraspinal phlebitis and thrombophlebitis: Secondary | ICD-10-CM | POA: Diagnosis not present

## 2021-02-06 DIAGNOSIS — I676 Nonpyogenic thrombosis of intracranial venous system: Secondary | ICD-10-CM | POA: Diagnosis not present

## 2021-02-06 DIAGNOSIS — Z7901 Long term (current) use of anticoagulants: Secondary | ICD-10-CM | POA: Insufficient documentation

## 2021-02-06 DIAGNOSIS — Z8616 Personal history of COVID-19: Secondary | ICD-10-CM | POA: Insufficient documentation

## 2021-02-06 DIAGNOSIS — R519 Headache, unspecified: Secondary | ICD-10-CM | POA: Diagnosis not present

## 2021-02-07 ENCOUNTER — Emergency Department (HOSPITAL_BASED_OUTPATIENT_CLINIC_OR_DEPARTMENT_OTHER)
Admission: EM | Admit: 2021-02-07 | Discharge: 2021-02-07 | Disposition: A | Discharge status: Home / Self Care | Payer: PPO | Attending: Emergency Medicine | Admitting: Emergency Medicine

## 2021-02-07 ENCOUNTER — Emergency Department (HOSPITAL_BASED_OUTPATIENT_CLINIC_OR_DEPARTMENT_OTHER): Payer: PPO

## 2021-02-07 ENCOUNTER — Encounter (HOSPITAL_BASED_OUTPATIENT_CLINIC_OR_DEPARTMENT_OTHER): Payer: Self-pay

## 2021-02-07 DIAGNOSIS — Z0389 Encounter for observation for other suspected diseases and conditions ruled out: Secondary | ICD-10-CM | POA: Diagnosis not present

## 2021-02-07 DIAGNOSIS — R519 Headache, unspecified: Secondary | ICD-10-CM

## 2021-02-07 DIAGNOSIS — G08 Intracranial and intraspinal phlebitis and thrombophlebitis: Secondary | ICD-10-CM

## 2021-02-07 IMAGING — CT CT HEAD W/O CM
4 series · 17 of 47 positions shown, 19 images · non-contrast
Comparison: [DATE]

CLINICAL DATA: Known thrombosis of the straight sinus.

EXAM:
CT HEAD WITHOUT CONTRAST
TECHNIQUE: Contiguous axial images were obtained from the base of the skull
through the vertex without intravenous contrast.

[Series 2: head bone · axial · 0.45mm/px · z∈[-94,-40]mm · 4 of 78 slices shown]
[im 8/78  bone]
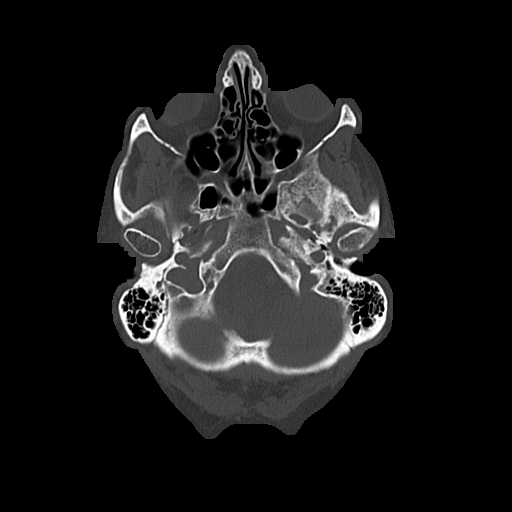
[im 16/78  bone]
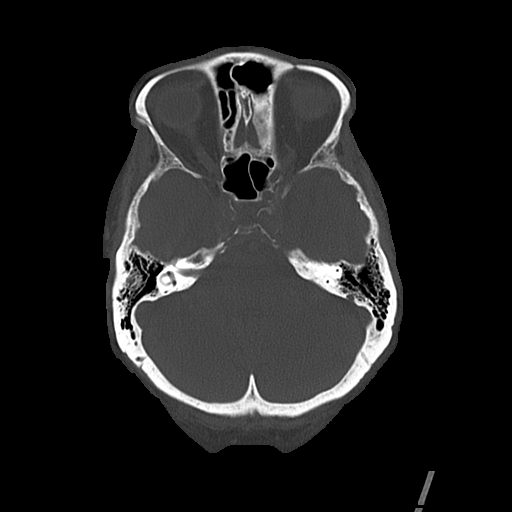
[im 24/78  bone]
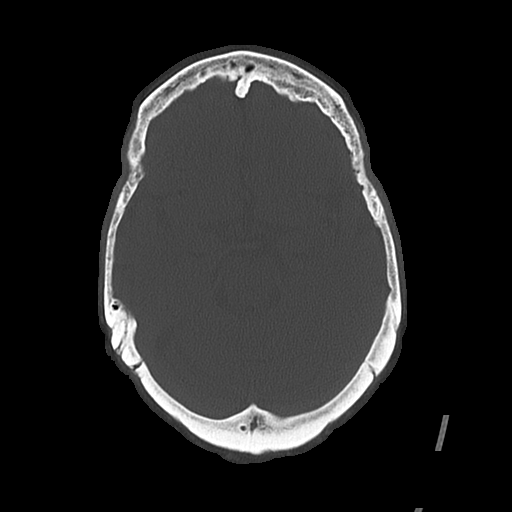
[im 35/78  bone]
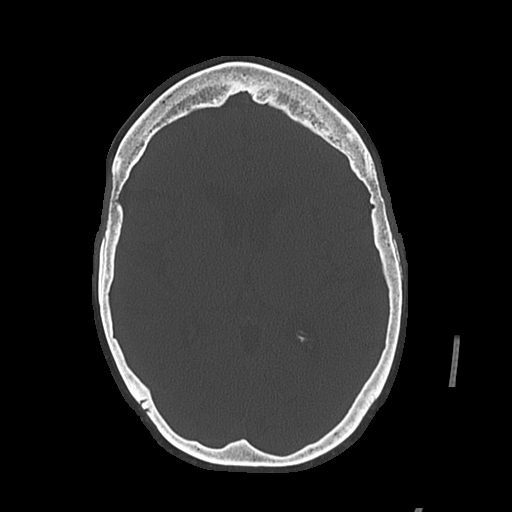

[Series 3: head wo · axial · 0.45mm/px · z∈[-93,+22]mm · 7 of 31 slices shown, 9 images]
[im 4/31  brain]
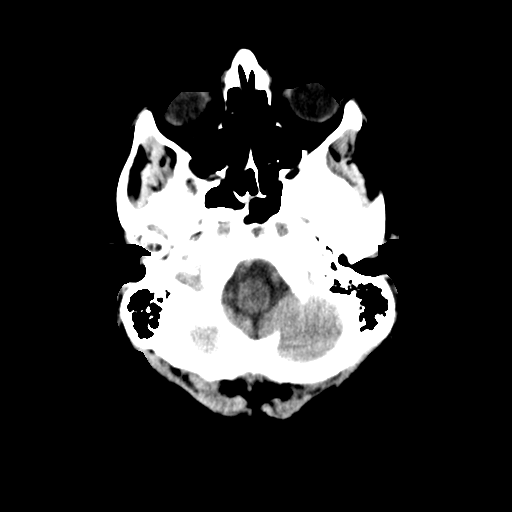
[im 4/31  bone]
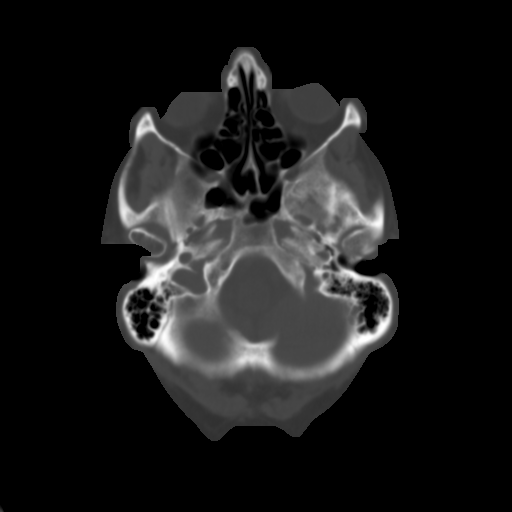
[im 8/31  brain]
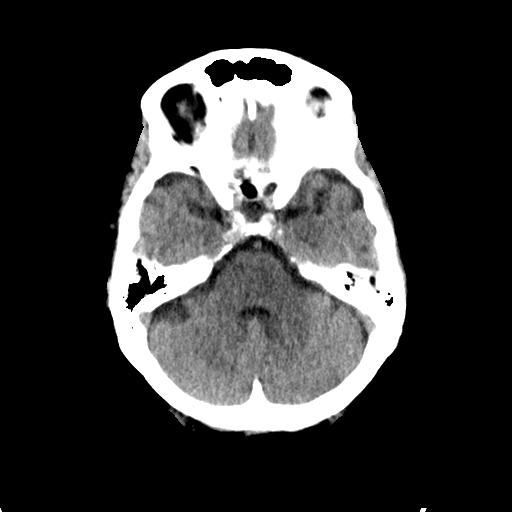
[im 12/31  brain]
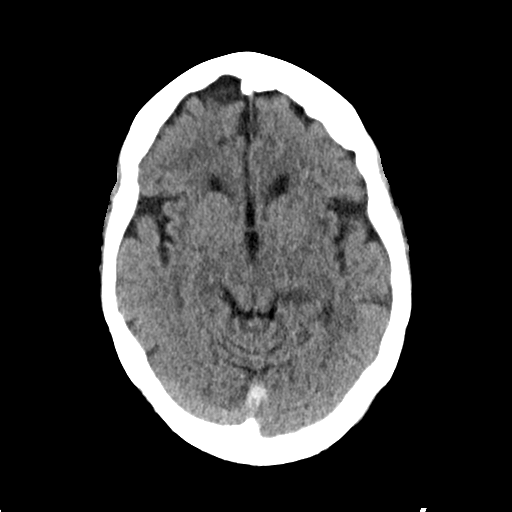
[im 16/31  brain]
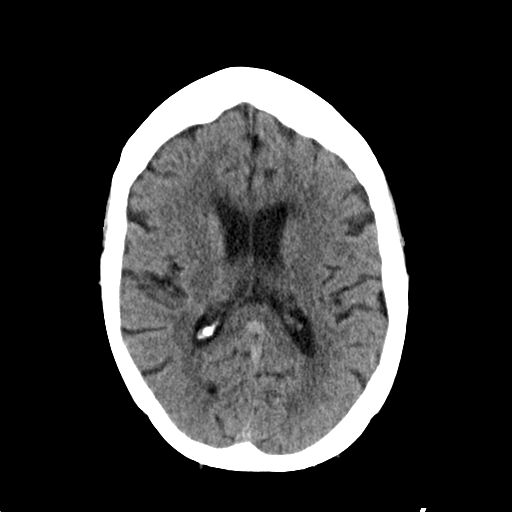
[im 19/31  brain]
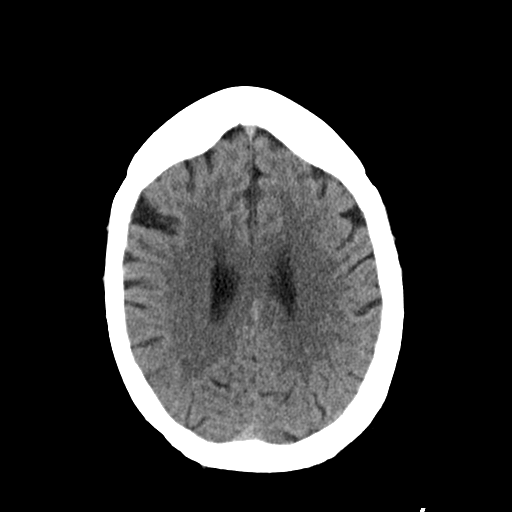
[im 19/31  bone]
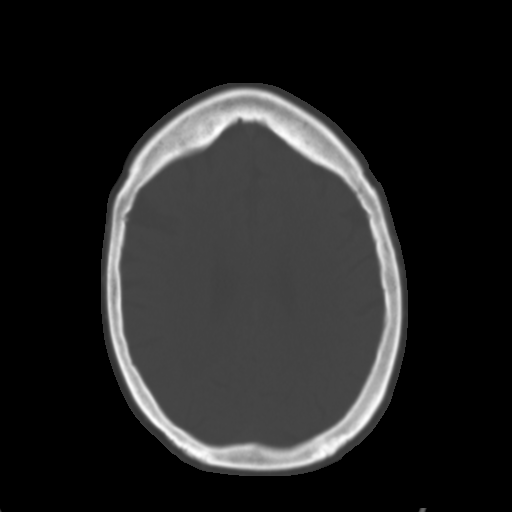
[im 23/31  brain]
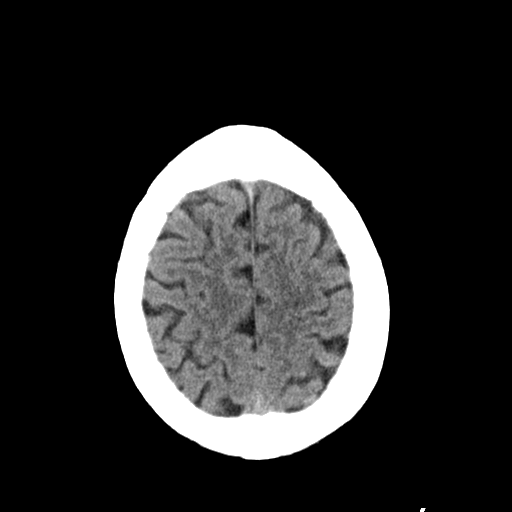
[im 27/31  brain]
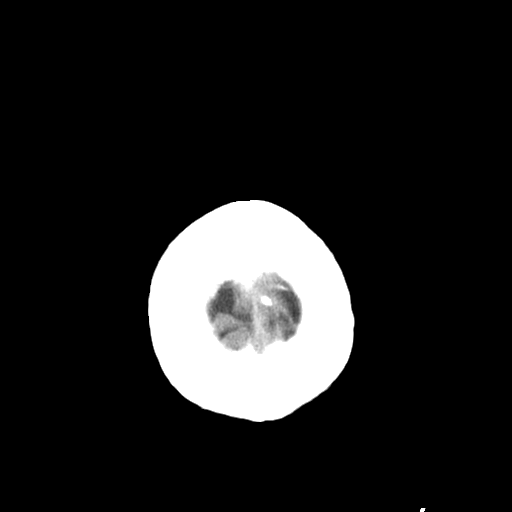

[Series 4: coronal soft · coronal · 0.29mm/px · 3 of 62 slices shown]
[im 22/62  brain]
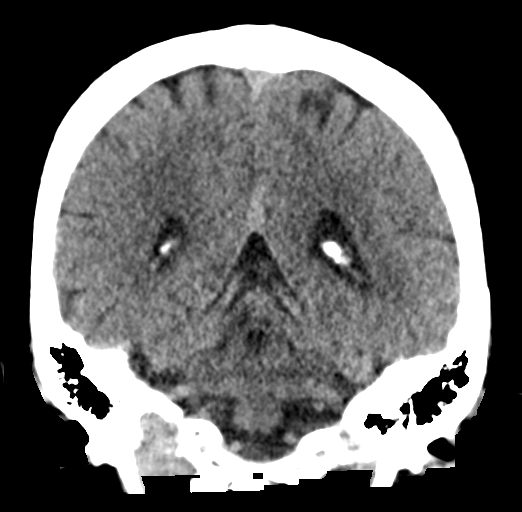
[im 28/62  brain]
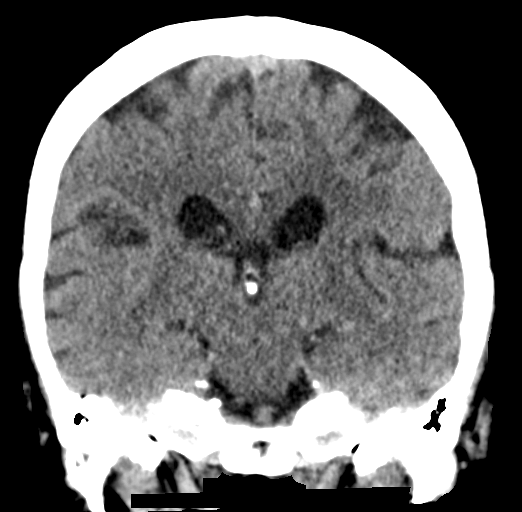
[im 34/62  brain]
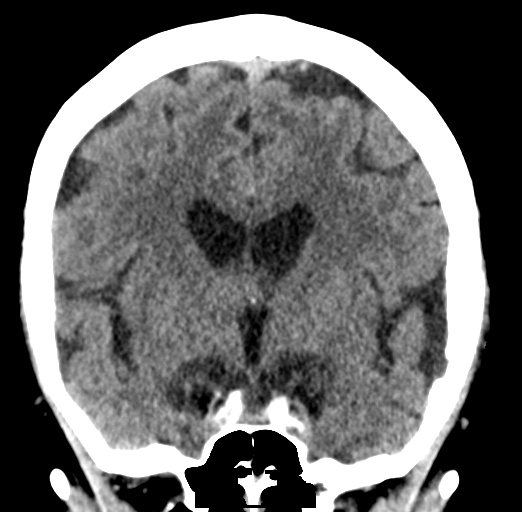

[Series 5: sagittal soft · sagittal · 0.29mm/px · 3 of 52 slices shown]
[im 18/52  brain]
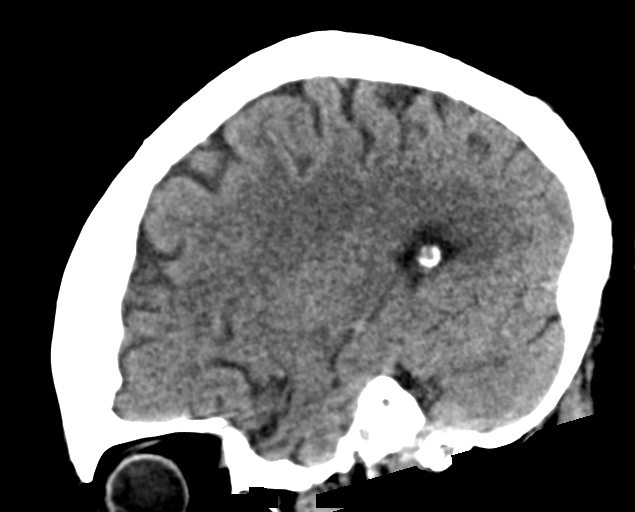
[im 26/52  brain]
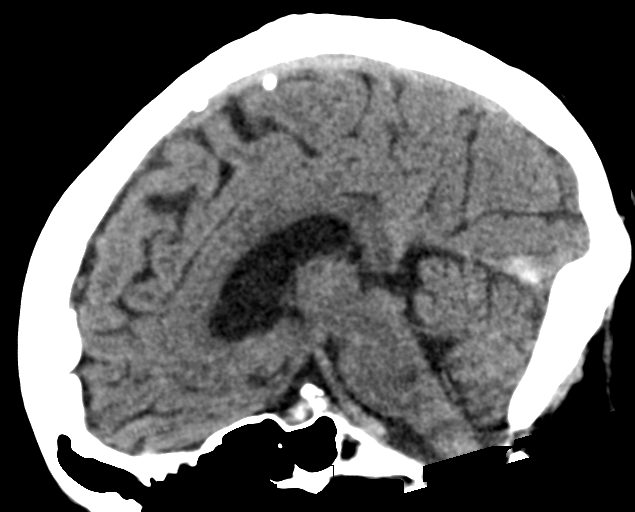
[im 35/52  brain]
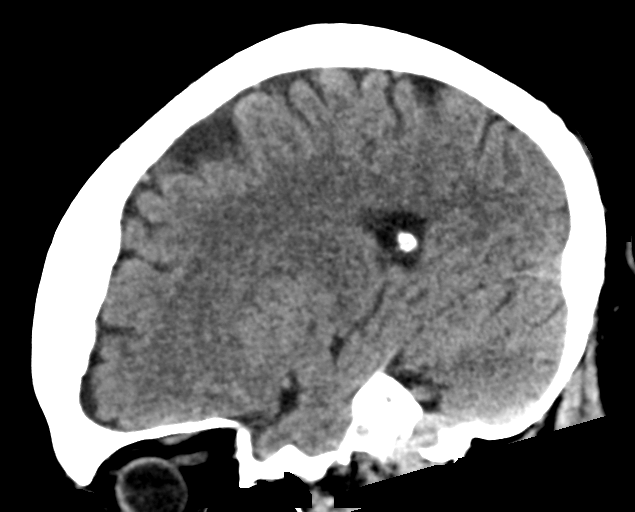

[17 of 47 positions shown; findings below may reference images not displayed]

FINDINGS: Brain: Persistent density is noted along straight sinus consistent
with the known history of thrombosis. No findings to suggest acute
hemorrhage, acute infarction or space-occupying mass lesion noted.

Vascular: No hyperdense vessel or unexpected calcification.

Skull: Normal. Negative for fracture or focal lesion.

Sinuses/Orbits: No acute finding.

Other: None.
IMPRESSION: Changes consistent with persistent thrombosis of the straight sinus.

No acute abnormality noted.

## 2021-02-07 MED ORDER — HYDROCODONE-ACETAMINOPHEN 5-325 MG PO TABS: 0.5000 | ORAL_TABLET | Freq: Four times a day (QID) | ORAL | 0 refills | Status: DC | PRN

## 2021-02-07 NOTE — ED Notes (Signed)
This RN presented the AVS utilizing Teachback Method. Patient verbalizes understanding of Discharge Instructions. Opportunity for Questioning and Answers were provided. Patient Discharged from ED ambulatory to Home with Significant Other.

## 2021-02-07 NOTE — ED Triage Notes (Addendum)
Pt is present for possible medical reaction to Eliquis that she started two days ago. Pt states she has taken three doses of her recently prescribed Eliquis and has noticed that her head will start to "throb and feel weird". The headache will improve throughout the day and when she takes her next dose the "cycle starts all over again". Currently rates headache 5/10. Pt recently discharged for cerebral venous thrombosis. Denies N/V or blurred vision.

## 2021-02-07 NOTE — ED Provider Notes (Signed)
[facility] EMERGENCY DEPT Provider Note   CSN: [id number] Arrival date & time: 02/06/21  2355     History Chief Complaint  Patient presents with   Headache   Medication Reaction    Hannah Bates is a 87 y.o. female.  Patient presents to the emergency department for evaluation of headache.  Patient was recently hospitalized for headache at which time she was diagnosed to have cerebral venous thrombosis.  She was started on heparin during hospitalization and headache improved.  She went home on Eliquis.  Patient reports that she feels a lot of pressure in her head since she started taking the Eliquis.  Tonight she turned her head and she had a shooting pain that went down her face and into her neck.  No numbness, weakness or strokelike symptoms.      Past Medical History:  Diagnosis Date   Arthritis    Osteoporosis    Palpitations    PVC (premature ventricular contraction)     Patient Active Problem List   Diagnosis Date Noted   Thrombosis 02/04/2021   COVID-19 virus infection 02/04/2021   Cerebral venous thrombosis 02/03/2021   Asbestos exposure 08/10/2018   PVC's (premature ventricular contractions) 05/19/2016   Palpitations 03/30/2016   GERD 02/10/2007   OSTEOARTHRITIS 02/10/2007   Osteoporosis 02/10/2007    Past Surgical History:  Procedure Laterality Date   BREAST CYST ASPIRATION Bilateral    CHOLECYSTECTOMY     EYE SURGERY     FINGER GANGLION CYST EXCISION     rt thumb     OB History   No obstetric history on file.     Family History  Problem Relation Age of Onset   Hyperlipidemia Mother    Heart disease Father    Hyperlipidemia Father    Hypertension Father    Lung disease Father        black lung- miner    Cancer Sister    Heart disease Sister    Hyperlipidemia Sister    Hypertension Sister    Breast cancer Other     Social History   Tobacco Use   Smoking status: Never   Smokeless tobacco: Never  Substance Use  Topics   Alcohol use: No    Alcohol/week: 0.0 standard drinks   Drug use: No    Home Medications Prior to Admission medications   Medication Sig Start Date End Date Taking? Authorizing Provider  HYDROcodone-acetaminophen (NORCO/VICODIN) 5-325 MG tablet Take 0.5 tablets by mouth every 6 (six) hours as needed for moderate pain. 02/07/21  Yes Neev Mcmains, Gwenyth Allegra, MD  Acetaminophen (TYLENOL PO) Take 1,000 mg by mouth daily as needed (pain/headache).     [provider]  apixaban (ELIQUIS) 5 MG TABS tablet Take 1 tablet (5 mg total) by mouth 2 (two) times daily. 02/05/21   Elgergawy, Silver Huguenin, MD  atorvastatin (LIPITOR) 20 MG tablet Take 20 mg by mouth daily.    [provider]  Biotin 10000 MCG TABS Take 10,000 mcg by mouth daily.    [provider]  calcium citrate (CALCITRATE - DOSED IN MG ELEMENTAL CALCIUM) 950 MG tablet Take 200 mg of elemental calcium by mouth daily.    [provider]  Cholecalciferol (VITAMIN D3) 5000 units CAPS Take 5,000 Units by mouth daily.    [provider]  estradiol (ESTRACE) 0.1 MG/GM vaginal cream PLACE A PEA- SIZED AMOUNT IN THE VAGINA NIGHTLY FOR 2 WEEKS, THEN USE EVERY OTHER NIGHT 09/09/20   [provider]  famotidine (PEPCID) 20 MG tablet Take 20 mg by mouth daily.    [provider]  Loratadine (CLARITIN PO) Take 1 tablet by mouth daily.    [provider]  Multiple Vitamins-Minerals (MULTIVITAMIN WITH MINERALS) tablet Take 1 tablet by mouth daily.    [provider]  omeprazole (PRILOSEC) 20 MG capsule 1 capsule 30 minutes before morning meal 01/13/15   [provider]  triamcinolone cream (KENALOG) 0.1 % Apply 1 application topically 2 (two) times daily. 12/22/19   Vanessa Kick, MD  dabigatran (PRADAXA) 150 MG CAPS capsule Take 1 capsule (150 mg total) by mouth 2 (two) times daily. 02/05/21 02/05/21  Elgergawy, Silver Huguenin, MD    Allergies    Codeine, Sulfonamide derivatives,  and Other  Review of Systems   Review of Systems  Neurological:  Positive for headaches.  All other systems reviewed and are negative.  Physical Exam Updated Vital Signs BP (!) 160/76   Pulse 66   Temp 98.2 F (36.8 C)   Resp 17   Ht 5' (1.524 m)   Wt 63.5 kg   SpO2 93%   BMI 27.34 kg/m   Physical Exam Vitals and nursing note reviewed.  Constitutional:      General: She is not in acute distress.    Appearance: Normal appearance. She is well-developed.  HENT:     Head: Normocephalic and atraumatic.     Right Ear: Hearing normal.     Left Ear: Hearing normal.     Nose: Nose normal.  Eyes:     Conjunctiva/sclera: Conjunctivae normal.     Pupils: Pupils are equal, round, and reactive to light.  Cardiovascular:     Rate and Rhythm: Regular rhythm.     Heart sounds: S1 normal and S2 normal. No murmur heard.   No friction rub. No gallop.  Pulmonary:     Effort: Pulmonary effort is normal. No respiratory distress.     Breath sounds: Normal breath sounds.  Chest:     Chest wall: No tenderness.  Abdominal:     General: Bowel sounds are normal.     Palpations: Abdomen is soft.     Tenderness: There is no abdominal tenderness. There is no guarding or rebound. Negative signs include Murphy's sign and McBurney's sign.     Hernia: No hernia is present.  Musculoskeletal:        General: Normal range of motion.     Cervical back: Normal range of motion and neck supple.  Skin:    General: Skin is warm and dry.     Findings: No rash.  Neurological:     Mental Status: She is alert and oriented to person, place, and time.     GCS: GCS eye subscore is 4. GCS verbal subscore is 5. GCS motor subscore is 6.     Cranial Nerves: No cranial nerve deficit.     Sensory: No sensory deficit.     Coordination: Coordination normal.     Comments: Extraocular muscle movement: normal No visual field cut Pupils: equal and reactive both direct and consensual response is normal No nystagmus  present    Sensory function is intact to light touch, pinprick Proprioception intact  Grip strength 5/5 symmetric in upper extremities No pronator drift Normal finger to nose bilaterally  Lower extremity strength 5/5 against gravity   Psychiatric:        Speech: Speech normal.        Behavior: Behavior normal.  Thought Content: Thought content normal.    ED Results / Procedures / Treatments   Labs (all labs ordered are listed, but only abnormal results are displayed) Labs Reviewed - No data to display  EKG None  Radiology CT HEAD WO CONTRAST (5MM)  Result Date: 02/07/2021 CLINICAL DATA:  Known thrombosis of the straight sinus. EXAM: CT HEAD WITHOUT CONTRAST TECHNIQUE: Contiguous axial images were obtained from the base of the skull through the vertex without intravenous contrast. COMPARISON:  02/03/2021 FINDINGS: Brain: Persistent density is noted along straight sinus consistent with the known history of thrombosis. No findings to suggest acute hemorrhage, acute infarction or space-occupying mass lesion noted. Vascular: No hyperdense vessel or unexpected calcification. Skull: Normal. Negative for fracture or focal lesion. Sinuses/Orbits: No acute finding. Other: None. IMPRESSION: Changes consistent with persistent thrombosis of the straight sinus. No acute abnormality noted. Electronically Signed   By: Inez Catalina M.D.   On: 02/07/2021 03:06    Procedures Procedures   Medications Ordered in ED Medications - No data to display  ED Course  I have reviewed the triage vital signs and the nursing notes.  Pertinent labs & imaging results that were available during my care of the patient were reviewed by me and considered in my medical decision making (see chart for details).    MDM Rules/Calculators/A&P                           Patient presents with headache.  Patient was recently hospitalized with cerebral venous thrombosis.  She was treated initially with heparin and  went home on Eliquis.  Patient reports that she thinks the Eliquis is causing a headache.  When she takes it the headache seems to worsen.  Tonight she had onset of sharp pain across her head that lasted for a while and then resolved.  She has a normal neurologic exam.  CT scan is reassuring, no evidence of acute bleeding.  Pain is significantly improved without any intervention.  Blood pressure was initially elevated but she reports "whitecoat syndrome".  Blood pressures have improved without intervention.  Will provide patient with analgesia to use for her headaches.  Final Clinical Impression(s) / ED Diagnoses Final diagnoses:  Cerebral venous thrombosis of straight sinus  Bad headache    Rx / DC Orders ED Discharge Orders          Ordered    HYDROcodone-acetaminophen (NORCO/VICODIN) 5-325 MG tablet  Every 6 hours PRN        02/07/21 0539             Orpah Greek, MD 02/07/21 [phone]

## 2021-02-07 NOTE — ED Notes (Signed)
Pt states her headache is getting worse

## 2021-02-10 DIAGNOSIS — G08 Intracranial and intraspinal phlebitis and thrombophlebitis: Secondary | ICD-10-CM | POA: Diagnosis not present

## 2021-02-10 DIAGNOSIS — N6489 Other specified disorders of breast: Secondary | ICD-10-CM | POA: Diagnosis not present

## 2021-02-16 ENCOUNTER — Telehealth (HOSPITAL_BASED_OUTPATIENT_CLINIC_OR_DEPARTMENT_OTHER): Payer: Self-pay | Admitting: Pharmacist

## 2021-02-16 ENCOUNTER — Other Ambulatory Visit (HOSPITAL_BASED_OUTPATIENT_CLINIC_OR_DEPARTMENT_OTHER): Payer: Self-pay

## 2021-02-16 ENCOUNTER — Other Ambulatory Visit (HOSPITAL_COMMUNITY): Payer: Self-pay

## 2021-02-16 NOTE — Telephone Encounter (Signed)
Pharmacy Transitions of Care Follow-up Telephone Call  Date of discharge: Cerebral venous thrombosis   Discharge Diagnosis: 02/05/2021  How have you been since you were released from the hospital? Doing okay, having some dizziness today and feeling "wiped out". Has slept a lot. After hospital discharge, she continued to experience several severe headache. She went [facility] to get checked - scans clear, headache a lingering affect of recent clot. Patient was given Norco. She continues to have some headaches today.    Medication changes made at discharge:  - START: Eliquis 5 mg BID  Medication changes verified by the patient? Yes    Medication Accessibility:  Home Pharmacy: [facility] [address]   Was the patient provided with refills on discharged medications? Yes   Have all prescriptions been transferred from [facility] to home pharmacy? Completed now   Is the patient able to afford medications? Yes Notable copays: $45/month for Eliquis    Medication Review:  APIXABAN (ELIQUIS)  Apixaban 5 mg BID - Discussed importance of taking medication around the same time everyday  - Reviewed potential DDIs with patient  - Advised patient of medications to avoid (NSAIDs, ASA)  - Educated that Tylenol (acetaminophen) will be the preferred analgesic to prevent risk of bleeding  - Emphasized importance of monitoring for signs and symptoms of bleeding (abnormal bruising, prolonged bleeding, nose bleeds, bleeding from gums, discolored urine, black tarry stools)  - Advised patient to alert all providers of anticoagulation therapy prior to starting a new medication or having a procedure   Follow-up Appointments:  Seen by PCP on August 9th, 2022.   Upcoming neurology follow-up on March 17, 2021 - confirmed with patient.   If their condition worsens, is the pt aware to call PCP or go to the Emergency Dept.? Yes  Harriet Pho, [place] at [facility]  02/16/2021 1:49 PM

## 2021-02-24 DIAGNOSIS — H35363 Drusen (degenerative) of macula, bilateral: Secondary | ICD-10-CM | POA: Diagnosis not present

## 2021-02-24 DIAGNOSIS — D3131 Benign neoplasm of right choroid: Secondary | ICD-10-CM | POA: Diagnosis not present

## 2021-02-24 DIAGNOSIS — Z961 Presence of intraocular lens: Secondary | ICD-10-CM | POA: Diagnosis not present

## 2021-02-24 DIAGNOSIS — H10413 Chronic giant papillary conjunctivitis, bilateral: Secondary | ICD-10-CM | POA: Diagnosis not present

## 2021-02-24 DIAGNOSIS — H04123 Dry eye syndrome of bilateral lacrimal glands: Secondary | ICD-10-CM | POA: Diagnosis not present

## 2021-02-24 DIAGNOSIS — H4321 Crystalline deposits in vitreous body, right eye: Secondary | ICD-10-CM | POA: Diagnosis not present

## 2021-03-17 ENCOUNTER — Telehealth: Payer: Self-pay | Admitting: Adult Health

## 2021-03-17 ENCOUNTER — Ambulatory Visit: Payer: PPO | Admitting: Adult Health

## 2021-03-17 ENCOUNTER — Encounter: Payer: Self-pay | Admitting: Adult Health

## 2021-03-17 VITALS — BP 151/79 | HR 75 | Ht 59.0 in | Wt 143.8 lb

## 2021-03-17 DIAGNOSIS — Z818 Family history of other mental and behavioral disorders: Secondary | ICD-10-CM | POA: Diagnosis not present

## 2021-03-17 DIAGNOSIS — G08 Intracranial and intraspinal phlebitis and thrombophlebitis: Secondary | ICD-10-CM

## 2021-03-17 NOTE — Progress Notes (Signed)
I agree with the above plan 

## 2021-03-17 NOTE — Telephone Encounter (Signed)
health team order sent to GI. [facility] they will reach out to the patient to schedule.

## 2021-03-17 NOTE — Patient Instructions (Signed)
Continue Eliquis (apixaban) daily with plans on continuing for duration of 6 months  Plan on repeating imaging around November for monitoring of your cerebral venous sinus thrombosis  Your headaches should continue to improve -if you experience any severe headache or any other neurological symptoms (vision changes, weakness on one side of body, speech difficulties or cognitive changes) please call 911 immediately for emergent evaluation     Followup in the future with me in 4 months or call earlier if needed     Thank you for coming to see [place] at [facility]. I hope we have been able to provide you high quality care today.  You may receive a patient satisfaction survey over the next few weeks. We would appreciate your feedback and comments so that we may continue to improve ourselves and the health of our patients.

## 2021-03-17 NOTE — Progress Notes (Signed)
[facility] [address] [place] [address] [phone]       [place] Date of Birth:  Jan 08, 1934 Medical Record Number:  [id number]   Reason for Referral:  hospital stroke follow up    SUBJECTIVE:   CHIEF COMPLAINT:  Chief Complaint  Patient presents with   Venous thrombosis    Rm 2, hospital FU, husband- [facility] "some headaches, want to know what was seen in CT scan of head done in ED-concerned about dementia d/t family history"     HPI:   Hannah Bates is a 87 y.o. female w/pmh of osteoarthritis, osteoporosis, PVCs, hyperlipidemia, recent history of COVID infection 8 days prior s/p treatment who presented on 02/03/2021 with headache, nausea and vomiting.  Evaluated by Dr. Leonie Man for cerebral venous sinus thrombosis possibly in setting of recent COVID infection causing hypercoagulable state.  Placed on heparin and transition to Pradaxa 150 mg twice daily at discharge for duration of 6 to 9 months.  Due to insurance coverage, she was switched to Eliquis.  MRI brain negative for acute stroke.  Resolution of headache and dizziness and returned to baseline.  No therapy needs and discharged home.  Today, 03/17/2021, Hannah Bates is being seen for hospital follow-up accompanied by her husband.  Overall doing well.  Headaches have been gradually improving with most recent one last Thursday which was mild.  Remains on Eliquis tolerating without side effects.  Blood pressure today 151/79 (reports whitecoat syndrome).  Routinely monitors at home and typically stable.  She does endorse a strong family history of dementia and is concerned regarding prior MRI reporting white matter hyperintensities.  She denies any current memory loss or cognitive difficulties.  No further concerns at this time.    PERTINENT IMAGING  02/03/21 CT Head WO IV Contrast Thickening and hyperdensity involving the straight sinus suspicious for  thrombus. No visualized venous infarct. Recommend further evaluation with CTV.   02/03/21 CT Venogram Head  Thrombosis of the straight sinus and vein of [place].   02/04/21 MRI Brain WO IV Contrast Mild nonspecific white matter changes related to small vessel disease.  No acute abnormality   02/04/21 MR Venogram Head  Thrombosis of straight sinus and vein of [place].  No filling defect in the superficial sagittal sinus.   11/11/20 Echocardiogram Complete   1. Left ventricular ejection fraction, by estimation, is 60 to 65%. The left ventricle has normal function. The left ventricle has no regional wall motion abnormalities. Left ventricular diastolic parameters are consistent with Grade I diastolic dysfunction (impaired relaxation). The average left ventricular global longitudinal strain is -20.0 %.   2. Right ventricular systolic function is normal. The right ventricular size is normal. There is mildly elevated pulmonary artery systolic pressure. The estimated right ventricular systolic pressure is [id number] mmHg.   3. The mitral valve is normal in structure. No evidence of mitral valve regurgitation.   4. The aortic valve is tricuspid. Aortic valve regurgitation is not visualized. Mild aortic valve sclerosis is present, with no evidence of aortic valve stenosis.   5. The inferior vena cava is normal in size with greater than 50% respiratory variability, suggesting right atrial pressure of 3 mmHg.    ROS:   14 system review of systems performed and negative with exception of those listed in HPI  PMH:  Past Medical History:  Diagnosis Date   Arthritis    Osteoporosis    Palpitations    PVC (  premature ventricular contraction)     PSH:  Past Surgical History:  Procedure Laterality Date   BREAST CYST ASPIRATION Bilateral    CHOLECYSTECTOMY     EYE SURGERY     FINGER GANGLION CYST EXCISION     rt thumb    Social History:  Social History   Socioeconomic History   Marital status: Married     Spouse name: [facility]   Number of children: Not on file   Years of education: 12   Highest education level: Not on file  Occupational History   Not on file  Tobacco Use   Smoking status: Never   Smokeless tobacco: Never  Substance and Sexual Activity   Alcohol use: No    Alcohol/week: 0.0 standard drinks   Drug use: No   Sexual activity: Not on file  Other Topics Concern   Not on file  Social History Narrative   03/17/21 lives with husband   Social Determinants of Health   Financial Resource Strain: Not on file  Food Insecurity: Not on file  Transportation Needs: Not on file  Physical Activity: Not on file  Stress: Not on file  Social Connections: Not on file  Intimate Partner Violence: Not on file    Family History:  Family History  Problem Relation Age of Onset   Dementia Mother    Hyperlipidemia Mother    Heart disease Father    Hyperlipidemia Father    Hypertension Father    Lung disease Father        black lung- miner    Dementia Sister    Cancer Sister    Heart disease Sister    Hyperlipidemia Sister    Hypertension Sister    Dementia Maternal Grandmother    Breast cancer Other     Medications:   Current Outpatient Medications on File Prior to Visit  Medication Sig Dispense Refill   Acetaminophen (TYLENOL PO) Take 1,000 mg by mouth daily as needed (pain/headache).      apixaban (ELIQUIS) 5 MG TABS tablet Take 1 tablet (5 mg total) by mouth 2 (two) times daily. 60 tablet 1   atorvastatin (LIPITOR) 20 MG tablet Take 20 mg by mouth daily.     Biotin 10000 MCG TABS Take 10,000 mcg by mouth daily.     calcium citrate (CALCITRATE - DOSED IN MG ELEMENTAL CALCIUM) 950 MG tablet Take 200 mg of elemental calcium by mouth daily.     Cholecalciferol (VITAMIN D3) 5000 units CAPS Take 5,000 Units by mouth daily.     estradiol (ESTRACE) 0.1 MG/GM vaginal cream PLACE A PEA- SIZED AMOUNT IN THE VAGINA NIGHTLY FOR 2 WEEKS, THEN USE EVERY OTHER NIGHT     famotidine (PEPCID) 20  MG tablet Take 20 mg by mouth daily.     HYDROcodone-acetaminophen (NORCO/VICODIN) 5-325 MG tablet Take 0.5 tablets by mouth every 6 (six) hours as needed for moderate pain. 10 tablet 0   Loratadine (CLARITIN PO) Take 1 tablet by mouth daily.     Multiple Vitamins-Minerals (MULTIVITAMIN WITH MINERALS) tablet Take 1 tablet by mouth daily.     omeprazole (PRILOSEC) 20 MG capsule 1 capsule 30 minutes before morning meal     [DISCONTINUED] dabigatran (PRADAXA) 150 MG CAPS capsule Take 1 capsule (150 mg total) by mouth 2 (two) times daily. 60 capsule 0   No current facility-administered medications on file prior to visit.    Allergies:   Allergies  Allergen Reactions   Codeine     REACTION: Rash  Sulfonamide Derivatives     REACTION: Rash   Other Rash    Event monitor leads need....sensitive      OBJECTIVE:  Physical Exam  Vitals:   03/17/21 1303  BP: (!) 151/79  Pulse: 75  Weight: 143 lb 12.8 oz (65.2 kg)  Height: '4\' 11"'$  (1.499 m)   Body mass index is 29.04 kg/m. No results found.   General: well developed, well nourished, very pleasant elderly Caucasian female, seated, in no evident distress Head: head normocephalic and atraumatic.   Neck: supple with no carotid or supraclavicular bruits Cardiovascular: regular rate and rhythm, no murmurs Musculoskeletal: no deformity Skin:  no rash/petichiae Vascular:  Normal pulses all extremities   Neurologic Exam Mental Status: Awake and fully alert.  Fluent speech and language.  Oriented to place and time. Recent and remote memory intact. Attention span, concentration and fund of knowledge appropriate. Mood and affect appropriate.  Cranial Nerves: Fundoscopic exam reveals sharp disc margins. Pupils equal, briskly reactive to light. Extraocular movements full without nystagmus. Visual fields full to confrontation. Hearing intact. Facial sensation intact. Face, tongue, palate moves normally and symmetrically.  Motor: Normal bulk and  tone. Normal strength in all tested extremity muscles Sensory.: intact to touch , pinprick , position and vibratory sensation.  Coordination: Rapid alternating movements normal in all extremities. Finger-to-nose and heel-to-shin performed accurately bilaterally. Gait and Station: Arises from chair without difficulty. Stance is normal. Gait demonstrates normal stride length and balance without use of assistive device.  Reflexes: 1+ and symmetric. Toes downgoing.          ASSESSMENT: Hannah Bates is a 87 y.o. year old female with recent cerebral venous sinus thrombosis on 02/03/2021 possibly in setting of recent COVID infection causing hypercoagulable state.      PLAN:  CVST : Continue Eliquis (apixaban) daily for total of 6 -9 mo duration.  Plan on repeat MR brain and MRV around November with surveillance monitoring.  Reassured that headache should continue to gradually improve.  Discussed proceeding to ED immediately for further evaluation with any severe onset of headache or new onset stroke/TIA symptoms Family history of dementia: Recent MRI showed mild white matter hyperintensities and mild brain atrophy likely more age-related especially as she has no signs or symptoms of dementia.  Defer ongoing monitoring to PCP    Follow up in 4 months or call earlier if needed   CC:  [facility] provider: Dr. Leonie Man PCP: [name], MD    I spent 54 minutes of face-to-face and non-face-to-face time with patient and husband.  This included previsit chart review including review of recent hospitalization, lab review, study review, order entry, electronic health record documentation, patient and husband education regarding recent hospitalization with evidence of cerebral venous sinus thrombosis and likely etiology, ongoing treatment plan and indication for surveillance monitoring, family history of dementia and recent brain imaging and answered all other questions to patient and husband  satisfaction   Frann Rider, AGNP-BC  [facility] [address] [place],  [address]  Phone [phone] Fax [phone] Note: This document was prepared with digital dictation and possible smart phrase technology. Any transcriptional errors that result from this process are unintentional.

## 2021-03-18 ENCOUNTER — Other Ambulatory Visit: Payer: Self-pay

## 2021-03-18 ENCOUNTER — Telehealth: Payer: Self-pay | Admitting: Adult Health

## 2021-03-18 ENCOUNTER — Ambulatory Visit
Admission: RE | Admit: 2021-03-18 | Discharge: 2021-03-18 | Disposition: A | Discharge status: Home / Self Care | Payer: PPO | Source: Ambulatory Visit | Attending: Family Medicine | Admitting: Family Medicine

## 2021-03-18 DIAGNOSIS — M810 Age-related osteoporosis without current pathological fracture: Secondary | ICD-10-CM

## 2021-03-18 DIAGNOSIS — M8588 Other specified disorders of bone density and structure, other site: Secondary | ICD-10-CM | POA: Diagnosis not present

## 2021-03-18 DIAGNOSIS — Z780 Asymptomatic menopausal state: Secondary | ICD-10-CM | POA: Diagnosis not present

## 2021-03-18 MED ORDER — APIXABAN 5 MG PO TABS: 5.0000 mg | ORAL_TABLET | Freq: Two times a day (BID) | ORAL | 5 refills | Status: DC

## 2021-03-18 NOTE — Telephone Encounter (Signed)
Pt called wanting to know who would be the one to refill her apixaban (ELIQUIS) 5 MG TABS tablet. Pt requesting a call back.

## 2021-03-18 NOTE — Telephone Encounter (Signed)
I called the pt and advised we could send the rx for the Eliquis at this time. Pt advised per plan from JM, NP we would continue for 6 months. I have sent rx and advised once we see her back in 4 months we would reassess.\\  Rx has been sent to [facility] in [place].

## 2021-04-27 DIAGNOSIS — M810 Age-related osteoporosis without current pathological fracture: Secondary | ICD-10-CM | POA: Diagnosis not present

## 2021-04-27 DIAGNOSIS — R030 Elevated blood-pressure reading, without diagnosis of hypertension: Secondary | ICD-10-CM | POA: Diagnosis not present

## 2021-04-27 DIAGNOSIS — K219 Gastro-esophageal reflux disease without esophagitis: Secondary | ICD-10-CM | POA: Diagnosis not present

## 2021-04-27 DIAGNOSIS — Z0000 Encounter for general adult medical examination without abnormal findings: Secondary | ICD-10-CM | POA: Diagnosis not present

## 2021-04-27 DIAGNOSIS — E7800 Pure hypercholesterolemia, unspecified: Secondary | ICD-10-CM | POA: Diagnosis not present

## 2021-04-27 DIAGNOSIS — G08 Intracranial and intraspinal phlebitis and thrombophlebitis: Secondary | ICD-10-CM | POA: Diagnosis not present

## 2021-04-27 DIAGNOSIS — Z23 Encounter for immunization: Secondary | ICD-10-CM | POA: Diagnosis not present

## 2021-05-14 ENCOUNTER — Ambulatory Visit
Admission: RE | Admit: 2021-05-14 | Discharge: 2021-05-14 | Disposition: A | Discharge status: Home / Self Care | Payer: PPO | Source: Ambulatory Visit | Attending: Adult Health | Admitting: Adult Health

## 2021-05-14 ENCOUNTER — Other Ambulatory Visit: Payer: Self-pay

## 2021-05-14 DIAGNOSIS — G08 Intracranial and intraspinal phlebitis and thrombophlebitis: Secondary | ICD-10-CM

## 2021-05-14 MED ORDER — GADOBENATE DIMEGLUMINE 529 MG/ML IV SOLN
13.0000 mL | Freq: Once | INTRAVENOUS | Status: AC | PRN
  Administered 2021-05-14: 13 mL via INTRAVENOUS

## 2021-05-19 ENCOUNTER — Telehealth: Payer: Self-pay | Admitting: *Deleted

## 2021-05-19 NOTE — Telephone Encounter (Signed)
Spoke with patient and informed her the recent imaging showed resolution of prior clot (sinus thrombosis)! Please continue Eliquis for at least 6 month duration. She verbalized understanding, appreciation.

## 2021-06-13 ENCOUNTER — Other Ambulatory Visit: Payer: Self-pay | Admitting: Surgery

## 2021-06-13 DIAGNOSIS — N6489 Other specified disorders of breast: Secondary | ICD-10-CM

## 2021-07-14 DIAGNOSIS — L570 Actinic keratosis: Secondary | ICD-10-CM | POA: Diagnosis not present

## 2021-07-14 DIAGNOSIS — L821 Other seborrheic keratosis: Secondary | ICD-10-CM | POA: Diagnosis not present

## 2021-07-14 DIAGNOSIS — D225 Melanocytic nevi of trunk: Secondary | ICD-10-CM | POA: Diagnosis not present

## 2021-07-14 DIAGNOSIS — L814 Other melanin hyperpigmentation: Secondary | ICD-10-CM | POA: Diagnosis not present

## 2021-07-16 ENCOUNTER — Other Ambulatory Visit: Payer: Self-pay | Admitting: Surgery

## 2021-07-16 ENCOUNTER — Ambulatory Visit
Admission: RE | Admit: 2021-07-16 | Discharge: 2021-07-16 | Disposition: A | Discharge status: Home / Self Care | Payer: PPO | Source: Ambulatory Visit | Attending: Surgery | Admitting: Surgery

## 2021-07-16 ENCOUNTER — Other Ambulatory Visit: Payer: Self-pay

## 2021-07-16 DIAGNOSIS — N6489 Other specified disorders of breast: Secondary | ICD-10-CM

## 2021-07-16 DIAGNOSIS — R922 Inconclusive mammogram: Secondary | ICD-10-CM | POA: Diagnosis not present

## 2021-07-16 IMAGING — MG MM DIGITAL DIAGNOSTIC UNILAT*L* W/ TOMO W/ CAD
4 series · 4 of 12 positions shown · non-contrast
Comparison: [DATE] and earlier

CLINICAL DATA: Six-month follow-up after stereotactic core biopsy
showed complex sclerosing lesion and focal apocrine atypia in the
LEFT breast. Per note from Dr. MAHALAFA, follow-up was preferred over
lumpectomy, given the patient's age. In [DATE], patient had
dural venous sinus thrombosis.

EXAM:
DIGITAL DIAGNOSTIC UNILATERAL LEFT MAMMOGRAM WITH TOMOSYNTHESIS AND
CAD
TECHNIQUE: Left digital diagnostic mammography and breast tomosynthesis was
performed. The images were evaluated with computer-aided detection.

[L MLO synth-2D]
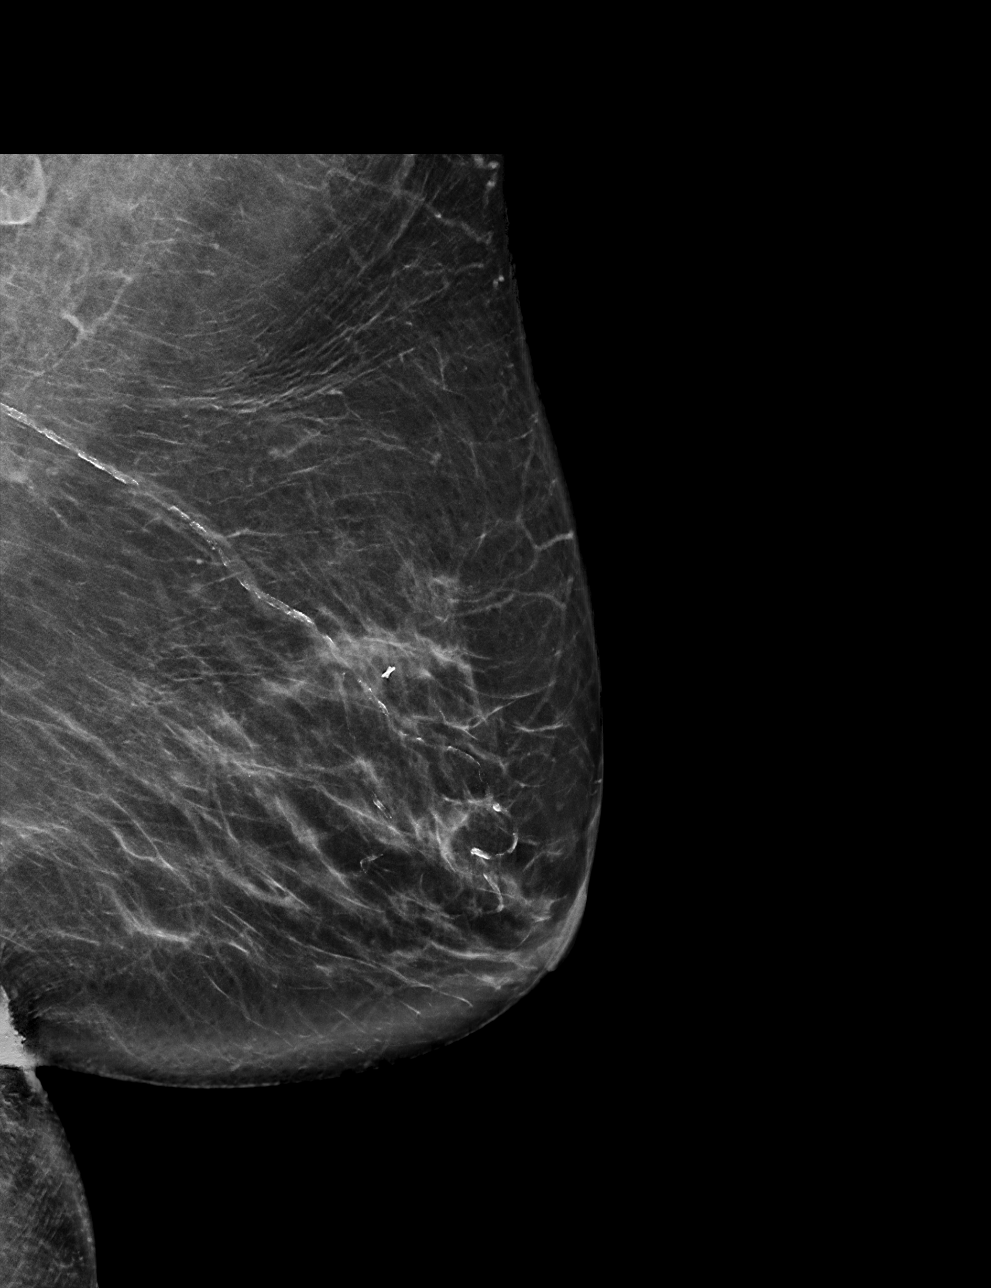

[L CC synth-2D]
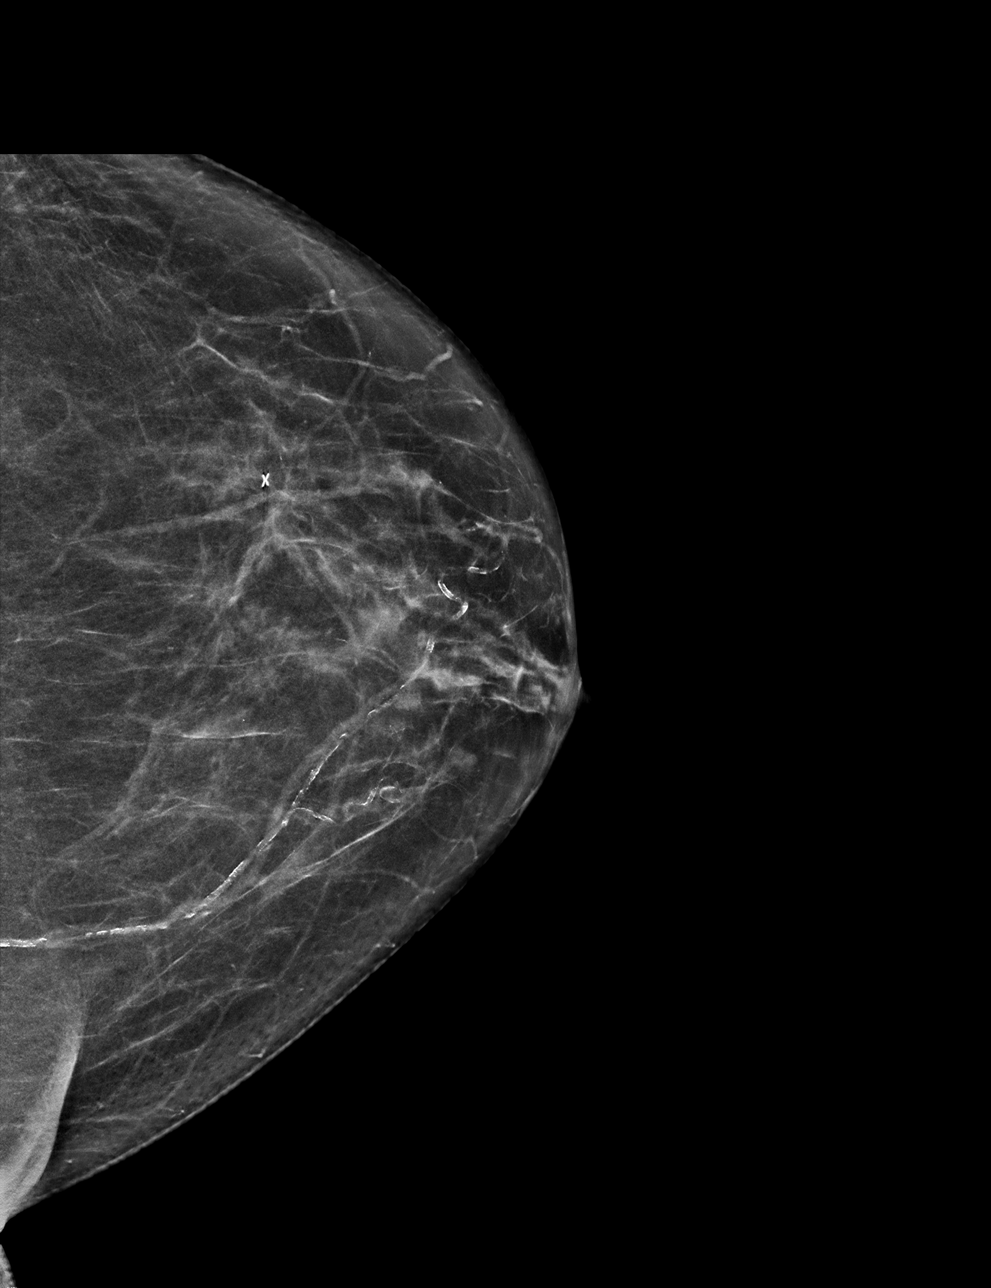

[L MLO tomo · tomo slice 37/72.0]
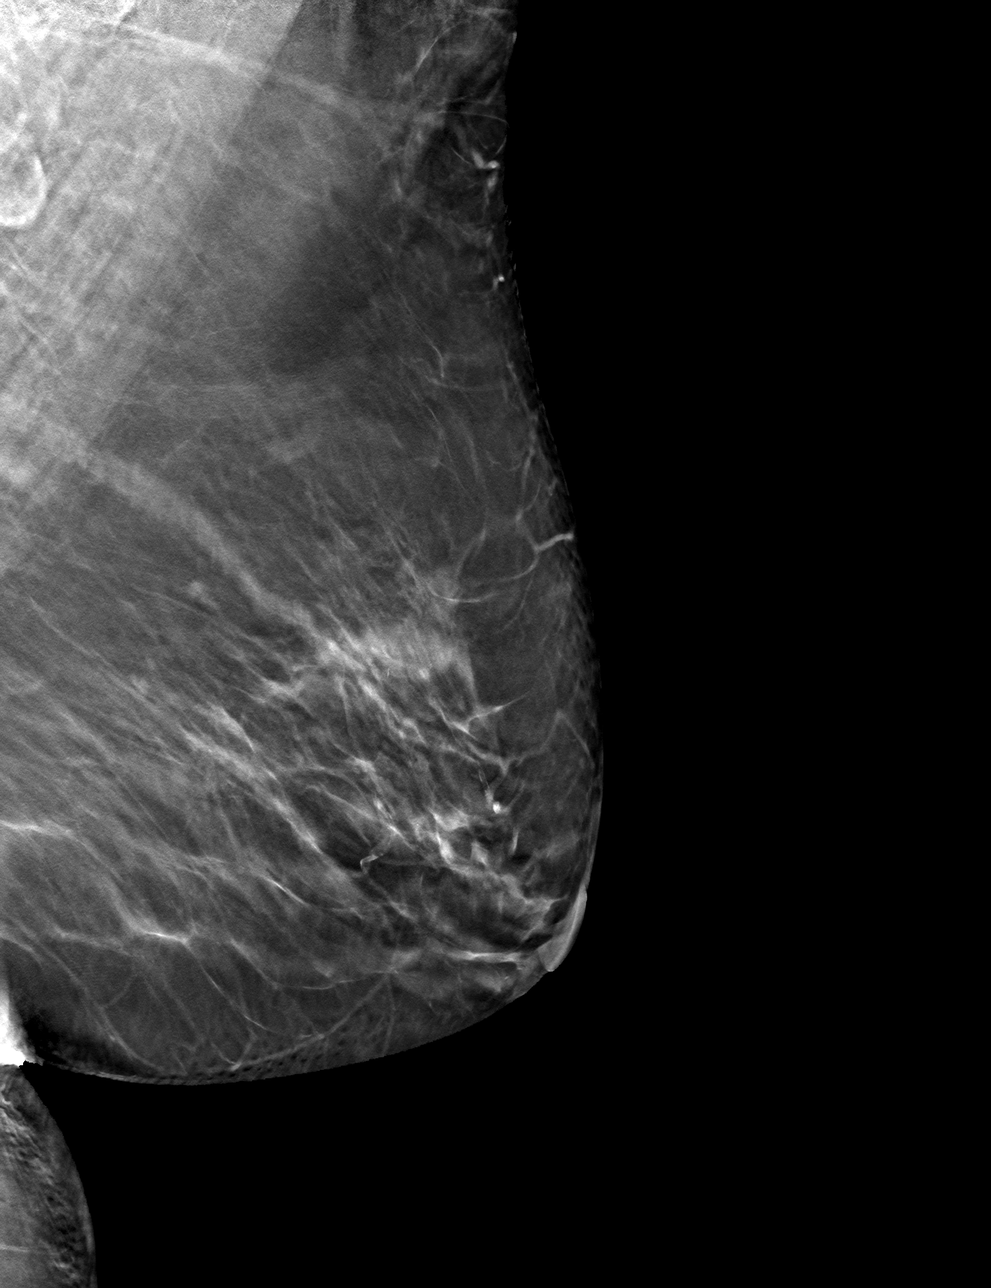

[L CC tomo · tomo slice 29/57.0]
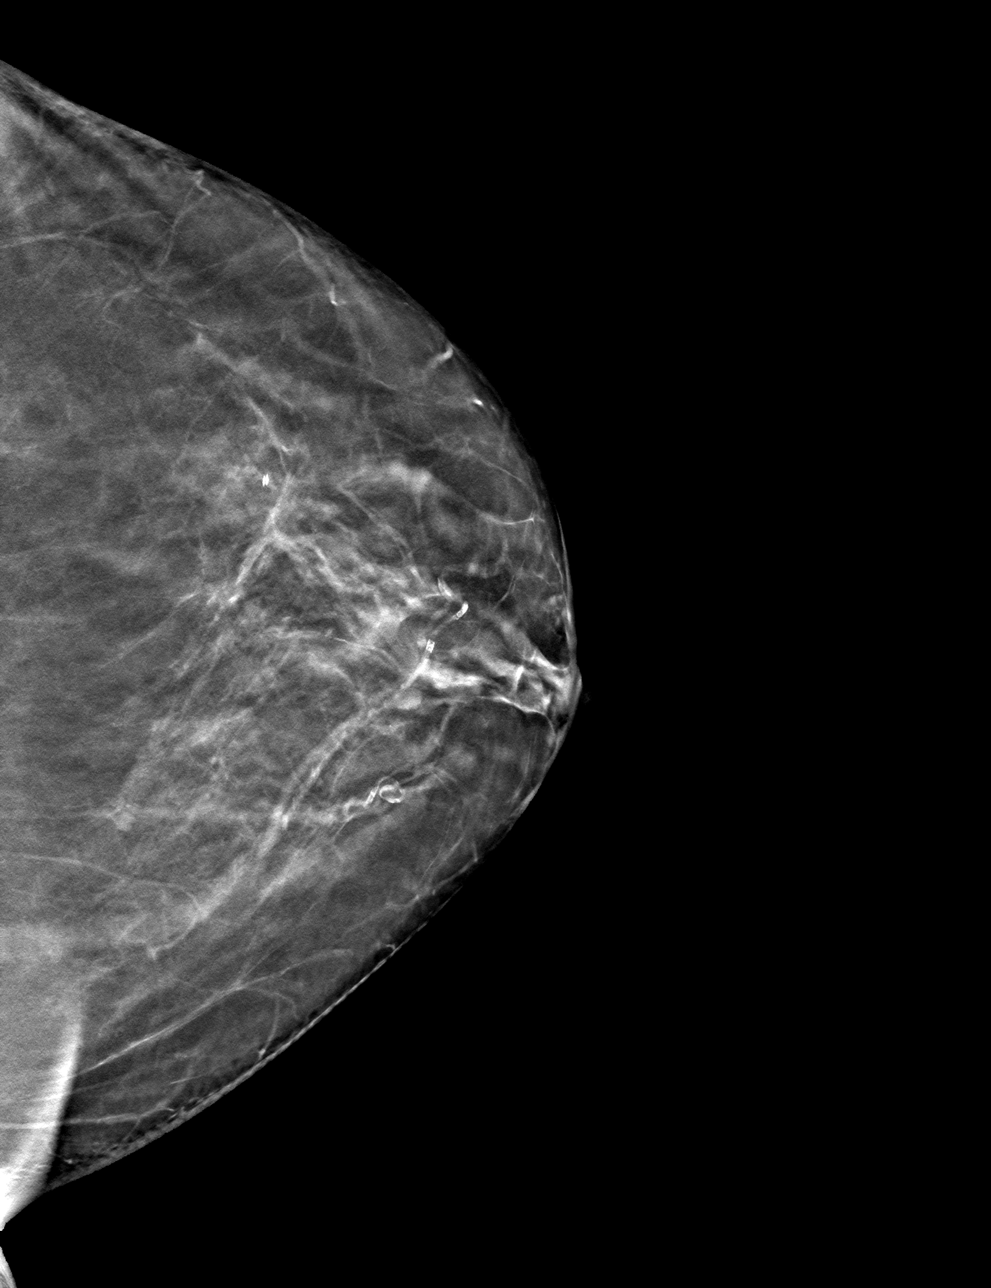

[4 of 12 positions shown; findings below may reference images not displayed]

ACR Breast Density Category b: There are scattered areas of
fibroglandular density.
FINDINGS: X shaped clip is identified in the UPPER-OUTER QUADRANT of the LEFT
breast following previous stereotactic guided core biopsy. There is
persistent distortion in this region, as expected. No new mass or
new areas of distortion.
IMPRESSION: Stable appearance of the LEFT breast following stereotactic guided
core biopsy.

RECOMMENDATION:
Recommend bilateral diagnostic mammogram in [DATE]. If
excision is not performed, recommend 1 additional diagnostic
mammogram in [DATE].

I have discussed the findings and recommendations with the patient.
If applicable, a reminder letter will be sent to the patient
regarding the next appointment.

BI-RADS CATEGORY  2: Benign.

## 2021-07-21 ENCOUNTER — Other Ambulatory Visit: Payer: Self-pay

## 2021-07-21 ENCOUNTER — Encounter: Payer: Self-pay | Admitting: Adult Health

## 2021-07-21 ENCOUNTER — Ambulatory Visit: Payer: PPO | Admitting: Adult Health

## 2021-07-21 VITALS — BP 156/80 | HR 77 | Ht 59.0 in | Wt 142.0 lb

## 2021-07-21 DIAGNOSIS — G08 Intracranial and intraspinal phlebitis and thrombophlebitis: Secondary | ICD-10-CM | POA: Diagnosis not present

## 2021-07-21 NOTE — Patient Instructions (Signed)
Continue Eliquis (apixaban) daily for additional 3 weeks then switch to aspirin 81mg  daily alone for secondary stroke prevention  Signs of a Stroke? Follow the BEFAST method:  Balance Watch for a sudden loss of balance, trouble with coordination or vertigo Eyes Is there a sudden loss of vision in one or both eyes? Or double vision?  Face: Ask the person to smile. Does one side of the face droop or is it numb?  Arms: Ask the person to raise both arms. Does one arm drift downward? Is there weakness or numbness of a leg? Speech: Ask the person to repeat a simple phrase. Does the speech sound slurred/strange? Is the person confused ? Time: If you observe any of these signs, call [phone].       Thank you for coming to see [place] at [facility]. I hope we have been able to provide you high quality care today.  You may receive a patient satisfaction survey over the next few weeks. We would appreciate your feedback and comments so that we may continue to improve ourselves and the health of our patients.

## 2021-07-21 NOTE — Progress Notes (Signed)
[facility] [address] [place]. [place] [address] [phone]       STROKE FOLLOW UP NOTE  [name] Date of Birth:  1933-07-31 Medical Record Number:  [id number]   Reason for Referral: stroke follow up    SUBJECTIVE:   CHIEF COMPLAINT:  Chief Complaint  Patient presents with   Follow-up    RM 3 with spouse Hannah Bates Pt is well and stable, symptoms have improved and things are going good. No new concerns     HPI:   Update 07/21/2021 Hannah Bates: Returns for 103-month stroke follow-up accompanied by her husband.  Overall doing well.  Reports resolution of headaches and denies any new or reoccurring stroke/TIA symptoms.  Repeat MRV/MRI showed resolution of prior thrombus (full impression below).  Currently on Eliquis but will be completed next month as 6 mo duration completed at that time.  Denies any current side effects.  Blood pressure today 156/80 (typically high at appts) -monitors at home and typically stable. Has f/u with PCP Dr. Lindell Noe in April.  No new concerns at this time.    History provided for reference purposes only Initial visit 03/17/2021 Hannah Bates: Hannah Bates is being seen for hospital follow-up accompanied by her husband.  Overall doing well.  Headaches have been gradually improving with most recent one last Thursday which was mild.  Remains on Eliquis tolerating without side effects.  Blood pressure today 151/79 (reports whitecoat syndrome).  Routinely monitors at home and typically stable.  She does endorse a strong family history of dementia and is concerned regarding prior MRI reporting white matter hyperintensities.  She denies any current memory loss or cognitive difficulties.  No further concerns at this time.  Stroke admission 02/03/2021 Hannah Bates is a 88 y.o. female w/pmh of osteoarthritis, osteoporosis, PVCs, hyperlipidemia, recent history of COVID infection 8 days prior s/p treatment who presented on 02/03/2021 with  headache, nausea and vomiting.  Evaluated by Dr. Leonie Man for cerebral venous sinus thrombosis possibly in setting of recent COVID infection causing hypercoagulable state.  Placed on heparin and transition to Pradaxa 150 mg twice daily at discharge for duration of 6 to 9 months.  Due to insurance coverage, she was switched to Eliquis.  MRI brain negative for acute stroke.  Resolution of headache and dizziness and returned to baseline.  No therapy needs and discharged home.     PERTINENT IMAGING  MRV HEAD 05/14/2021 IMPRESSION: This is a normal MR venogram of the intracranial veins and dural sinuses.  The absent venous/dural sinus flow noted on the 02/04/2021 MR venogram is now normal and there is no evidence of thrombus  MR BRAIN W WO CONTRAST 05/14/2021 IMPRESSION: This MRI of the brain with and without contrast shows the following: 1.  Some scattered T2/FLAIR hyperintense foci in the hemispheres and a couple of the pons consistent with mild chronic microvascular ischemic change, typical for age.  None of the foci enhance or appear to be acute.  No change compared to the 02/04/2021 MRI. 2.  There is now a normal enhancement of the vein of Galen and straight sinus consistent with resolution of the thrombus noted earlier this year. 3.   No acute findings.     02/03/21 CT Head WO IV Contrast Thickening and hyperdensity involving the straight sinus suspicious for thrombus. No visualized venous infarct. Recommend further evaluation with CTV.   02/03/21 CT Venogram Head  Thrombosis of the straight sinus and vein of Galen.   02/04/21 MRI Brain WO  IV Contrast Mild nonspecific white matter changes related to small vessel disease.  No acute abnormality   02/04/21 MR Venogram Head  Thrombosis of straight sinus and vein of Galen.  No filling defect in the superficial sagittal sinus.   11/11/20 Echocardiogram Complete   1. Left ventricular ejection fraction, by estimation, is 60 to 65%. The left ventricle has  normal function. The left ventricle has no regional wall motion abnormalities. Left ventricular diastolic parameters are consistent with Grade I diastolic dysfunction (impaired relaxation). The average left ventricular global longitudinal strain is -20.0 %.   2. Right ventricular systolic function is normal. The right ventricular size is normal. There is mildly elevated pulmonary artery systolic pressure. The estimated right ventricular systolic pressure is 01.7 mmHg.   3. The mitral valve is normal in structure. No evidence of mitral valve regurgitation.   4. The aortic valve is tricuspid. Aortic valve regurgitation is not visualized. Mild aortic valve sclerosis is present, with no evidence of aortic valve stenosis.   5. The inferior vena cava is normal in size with greater than 50% respiratory variability, suggesting right atrial pressure of 3 mmHg.    ROS:   14 system review of systems performed and negative with exception of those listed in HPI  PMH:  Past Medical History:  Diagnosis Date   Arthritis    Osteoporosis    Palpitations    PVC (premature ventricular contraction)     PSH:  Past Surgical History:  Procedure Laterality Date   BREAST CYST ASPIRATION Bilateral    CHOLECYSTECTOMY     EYE SURGERY     FINGER GANGLION CYST EXCISION     rt thumb    Social History:  Social History   Socioeconomic History   Marital status: Married    Spouse name: WL   Number of children: Not on file   Years of education: 12   Highest education level: Not on file  Occupational History   Not on file  Tobacco Use   Smoking status: Never   Smokeless tobacco: Never  Substance and Sexual Activity   Alcohol use: No    Alcohol/week: 0.0 standard drinks   Drug use: No   Sexual activity: Not on file  Other Topics Concern   Not on file  Social History Narrative   03/17/21 lives with husband   Social Determinants of Health   Financial Resource Strain: Not on file  Food Insecurity: Not  on file  Transportation Needs: Not on file  Physical Activity: Not on file  Stress: Not on file  Social Connections: Not on file  Intimate Partner Violence: Not on file    Family History:  Family History  Problem Relation Age of Onset   Dementia Mother    Hyperlipidemia Mother    Heart disease Father    Hyperlipidemia Father    Hypertension Father    Lung disease Father        black lung- miner    Dementia Sister    Cancer Sister    Heart disease Sister    Hyperlipidemia Sister    Hypertension Sister    Dementia Maternal Grandmother    Breast cancer Other     Medications:   Current Outpatient Medications on File Prior to Visit  Medication Sig Dispense Refill   Acetaminophen (TYLENOL PO) Take 1,000 mg by mouth daily as needed (pain/headache).      apixaban (ELIQUIS) 5 MG TABS tablet Take 1 tablet (5 mg total) by mouth 2 (two)  times daily. 60 tablet 5   atorvastatin (LIPITOR) 20 MG tablet Take 20 mg by mouth daily.     Biotin 10000 MCG TABS Take 10,000 mcg by mouth daily.     calcium citrate (CALCITRATE - DOSED IN MG ELEMENTAL CALCIUM) 950 MG tablet Take 200 mg of elemental calcium by mouth daily.     Cholecalciferol (VITAMIN D3) 5000 units CAPS Take 5,000 Units by mouth daily.     estradiol (ESTRACE) 0.1 MG/GM vaginal cream PLACE A PEA- SIZED AMOUNT IN THE VAGINA NIGHTLY FOR 2 WEEKS, THEN USE EVERY OTHER NIGHT     famotidine (PEPCID) 20 MG tablet Take 20 mg by mouth daily.     Loratadine (CLARITIN PO) Take 1 tablet by mouth daily.     Multiple Vitamins-Minerals (MULTIVITAMIN WITH MINERALS) tablet Take 1 tablet by mouth daily.     omeprazole (PRILOSEC) 20 MG capsule 1 capsule 30 minutes before morning meal     [DISCONTINUED] dabigatran (PRADAXA) 150 MG CAPS capsule Take 1 capsule (150 mg total) by mouth 2 (two) times daily. 60 capsule 0   No current facility-administered medications on file prior to visit.    Allergies:   Allergies  Allergen Reactions   Codeine      REACTION: Rash   Sulfonamide Derivatives     REACTION: Rash   Other Rash    Event monitor leads need....sensitive      OBJECTIVE:  Physical Exam  Vitals:   07/21/21 0915  BP: (!) 156/80  Pulse: 77  Weight: 142 lb (64.4 kg)  Height: 4\' 11"  (1.499 m)   Body mass index is 28.68 kg/m. No results found.  General: well developed, well nourished, very pleasant elderly Caucasian female, seated, in no evident distress Head: head normocephalic and atraumatic.   Neck: supple with no carotid or supraclavicular bruits Cardiovascular: regular rate and rhythm, no murmurs Musculoskeletal: no deformity Skin:  no rash/petichiae Vascular:  Normal pulses all extremities   Neurologic Exam Mental Status: Awake and fully alert.  Fluent speech and language.  Oriented to place and time. Recent and remote memory intact. Attention span, concentration and fund of knowledge appropriate. Mood and affect appropriate.  Cranial Nerves: Pupils equal, briskly reactive to light. Extraocular movements full without nystagmus. Visual fields full to confrontation. Hearing intact. Facial sensation intact. Face, tongue, palate moves normally and symmetrically.  Motor: Normal bulk and tone. Normal strength in all tested extremity muscles Sensory.: intact to touch , pinprick , position and vibratory sensation.  Coordination: Rapid alternating movements normal in all extremities. Finger-to-nose and heel-to-shin performed accurately bilaterally. Gait and Station: Arises from chair without difficulty. Stance is normal. Gait demonstrates normal stride length and balance without use of assistive device.  Reflexes: 1+ and symmetric. Toes downgoing.          ASSESSMENT: LYBERTI THRUSH is a 88 y.o. year old female with recent cerebral venous sinus thrombosis on 02/03/2021 possibly in setting of recent COVID infection causing hypercoagulable state.    PLAN:  CVST : no residual headaches.  Repeat MRI/MRV 05/2021  showed resolution of prior CVST. Continue Eliquis (apixaban) daily for 3 more weeks then stop as 6 mo duration completed and switch to aspirin 81 mg daily alone for stroke prevention. Continue to follow with PCP for aggressive stroke risk factor management   Stable from stroke standpoint -follow-up as needed   CC:  PCP: [name], MD    I spent 24 minutes of face-to-face and non-face-to-face time with patient and  husband.  This included previsit chart review, lab review, study review, electronic health record documentation, patient and husband education regarding CVST and repeat imaging, further treatment plan as noted above and answered all other questions to patient and husband satisfaction   Frann Rider, AGNP-BC  [facility] [address] [place],  [address]  Phone [phone] Fax [phone] Note: This document was prepared with digital dictation and possible smart phrase technology. Any transcriptional errors that result from this process are unintentional.

## 2021-08-17 ENCOUNTER — Ambulatory Visit: Payer: Self-pay | Admitting: Surgery

## 2021-08-17 DIAGNOSIS — N6489 Other specified disorders of breast: Secondary | ICD-10-CM | POA: Diagnosis not present

## 2021-08-19 ENCOUNTER — Encounter: Payer: Self-pay | Admitting: Emergency Medicine

## 2021-08-19 ENCOUNTER — Ambulatory Visit (INDEPENDENT_AMBULATORY_CARE_PROVIDER_SITE_OTHER): Payer: PPO

## 2021-08-19 ENCOUNTER — Ambulatory Visit: Payer: PPO | Admitting: Emergency Medicine

## 2021-08-19 ENCOUNTER — Other Ambulatory Visit: Payer: Self-pay

## 2021-08-19 VITALS — BP 142/80 | HR 91 | Temp 98.2°F | Ht 59.0 in | Wt 142.6 lb

## 2021-08-19 DIAGNOSIS — K219 Gastro-esophageal reflux disease without esophagitis: Secondary | ICD-10-CM

## 2021-08-19 DIAGNOSIS — J310 Chronic rhinitis: Secondary | ICD-10-CM | POA: Diagnosis not present

## 2021-08-19 DIAGNOSIS — Z77090 Contact with and (suspected) exposure to asbestos: Secondary | ICD-10-CM | POA: Diagnosis not present

## 2021-08-19 IMAGING — DX DG CHEST 2V
2 series · 2 of 2 positions shown · non-contrast
Comparison: [DATE]

CLINICAL DATA: Exposure to asbestos.

EXAM:
CHEST - 2 VIEW

[chest pa]
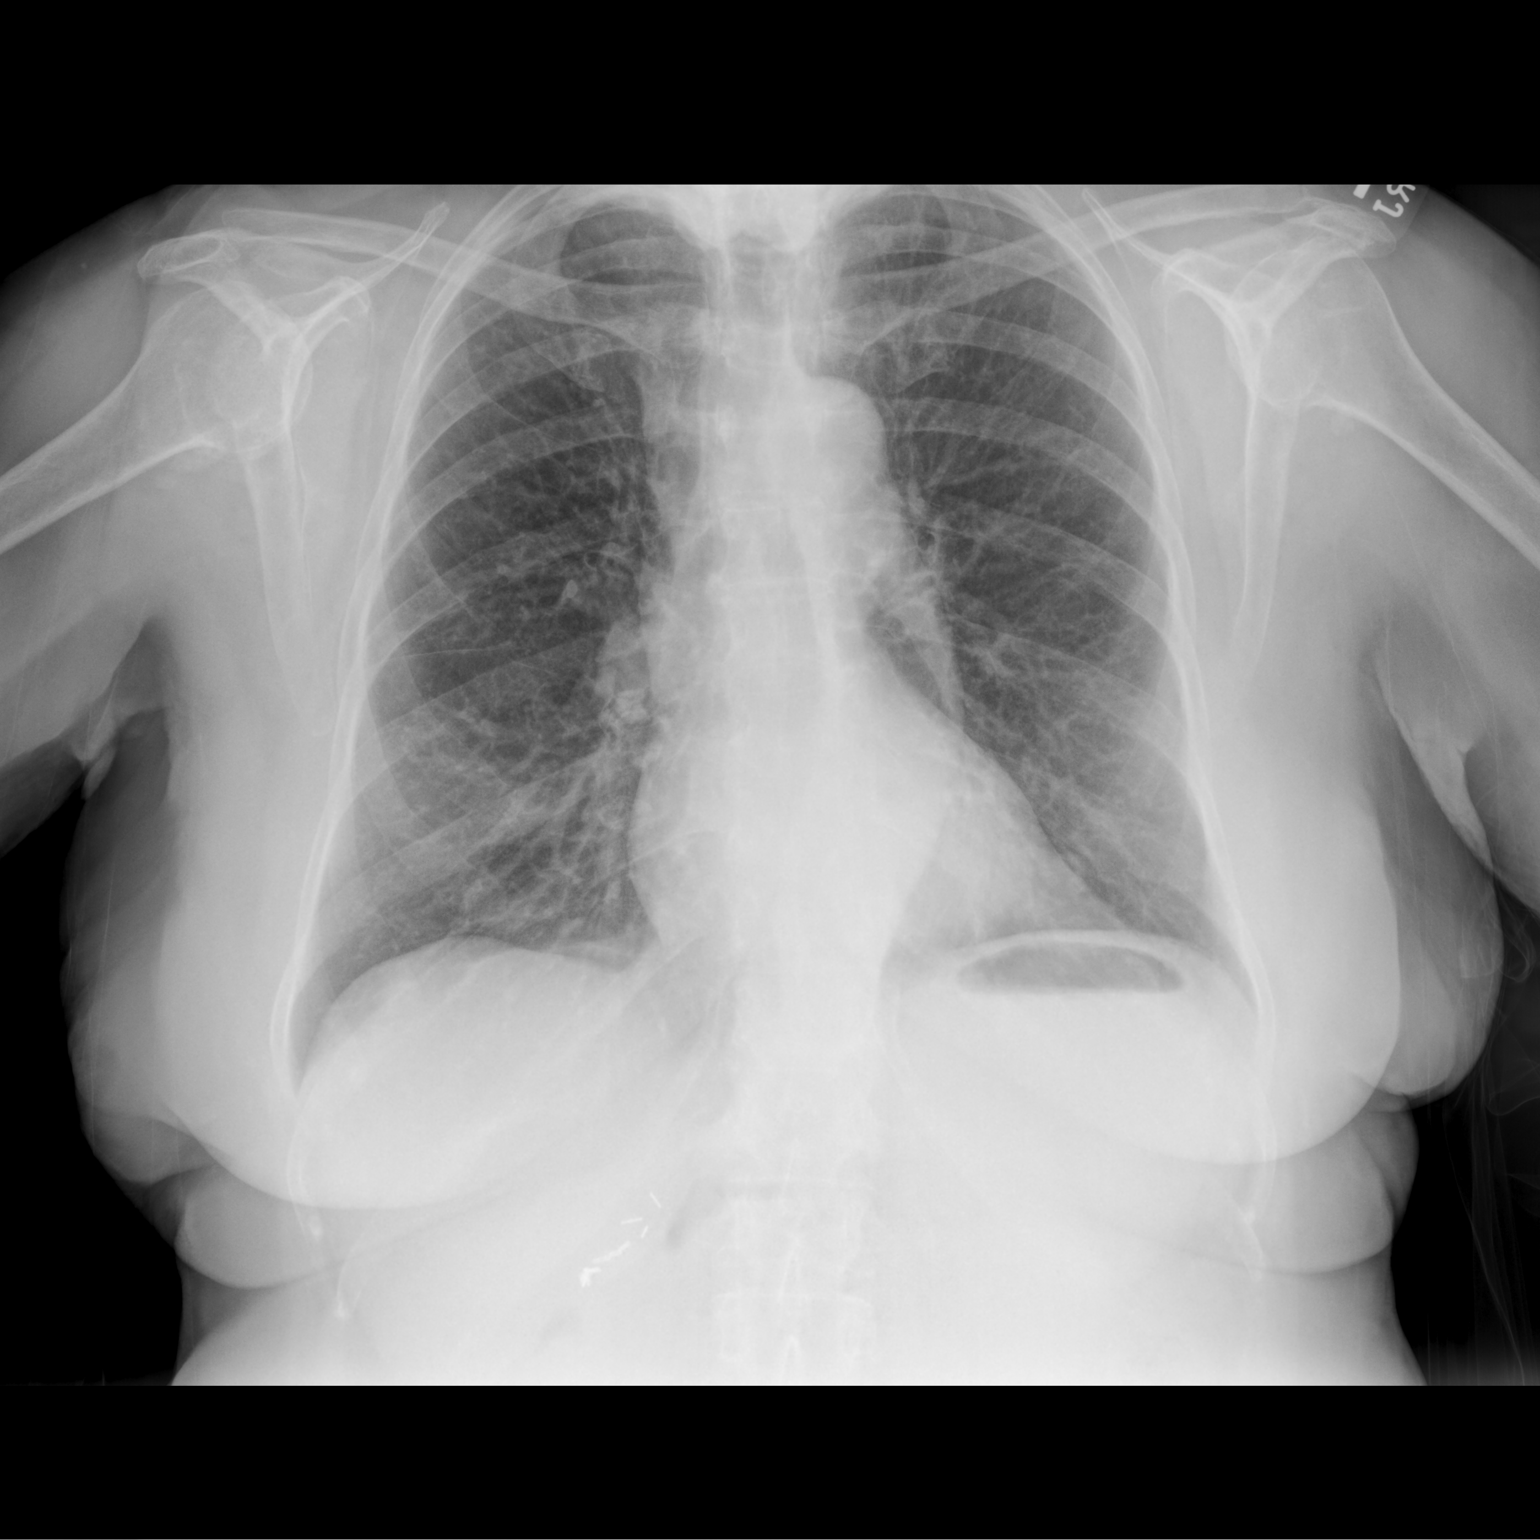

[chest lat]
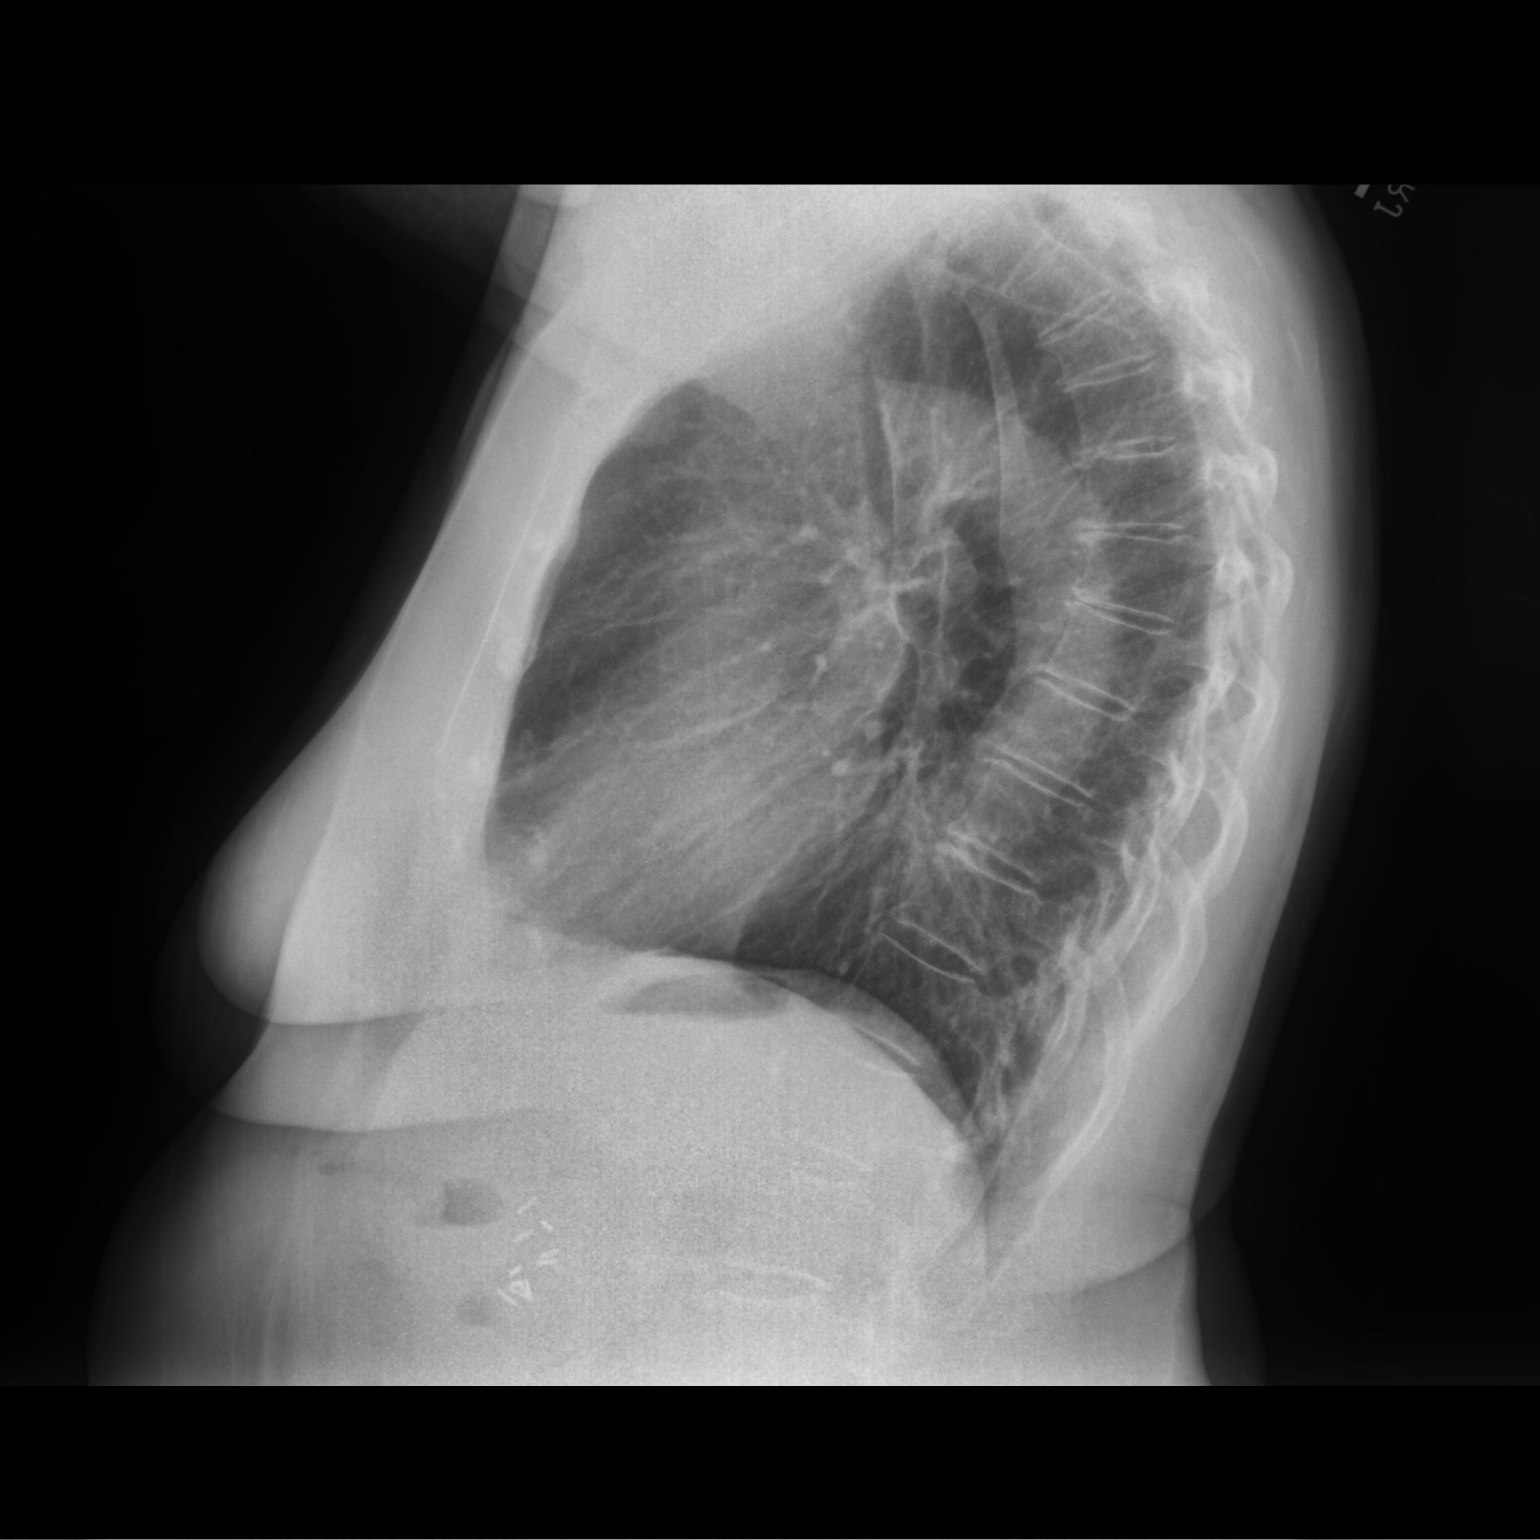

[2 of 2 positions shown; findings below may reference images not displayed]

FINDINGS: The heart size and mediastinal contours are within normal limits.
Both lungs are clear. The visualized skeletal structures are
unremarkable.
IMPRESSION: No active cardiopulmonary disease.

## 2021-08-19 NOTE — Assessment & Plan Note (Signed)
Documented exposure through her husband's work but her chest x-ray has been reassuring.  We have been following annually, will repeat today.  We will discuss results by phone.

## 2021-08-19 NOTE — Assessment & Plan Note (Signed)
Intermittently uses fluticasone, just restarted it because her symptoms have been active.  She will use depending on frequency of cough, rhinitis.

## 2021-08-19 NOTE — Progress Notes (Signed)
Subjective:    Patient ID: Hannah Bates, female    DOB: September 16, 1933, 88 y.o.   MRN: [id number]   HPI 88 year old never smoker with a history of osteoporosis, arthritis, PVCs, hyperlipidemia, allergic rhinitis, GERD.  She is a self-referral today to follow and monitor for an asbestos exposure. Her husband worked with asbestos and she was exposed to his clothing, to dust and affected material. 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 She has never had an abnormal CXR to her knowledge. No lung problems in the past. She has some cough, happens infrequently, but she can wake herself up at night. Her reflux was controlled on omeprazole, now on pepcid. No CP, no hemoptysis.    ROV 08/19/21 --88 year old woman, never smoker with history of allergic rhinitis and GERD with some associated cough.  I have followed her and also her husband for asbestos exposure.  She was clearly exposed by his clothing when he was working with the material.  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  Last chest x-ray I have available was from 2 years ago, was clear. She reports that she was admitted for COVID and cerebral venous thrombus since last time (02/2021), was treated with Eliquis and is now on ASA. She reports that her breathing is doing well. Her fatigue and HA are both better. She is having some increased drainage and cough - just started flonase yesterday. Is on loratadine. She is also on pepcid, is not having any breakthrough.   Review of Systems  Constitutional:  Negative for activity change, fatigue and fever.  HENT:  Negative for congestion, postnasal drip and sore throat.   Respiratory:  Negative for cough, chest tightness and shortness of breath.   Cardiovascular:  Negative for chest pain.  Gastrointestinal:  Negative for abdominal pain.  Musculoskeletal:  Negative for arthralgias, joint swelling and myalgias.   Neurological:  Negative for dizziness and seizures.  Past Medical History:  Diagnosis Date   Arthritis    Osteoporosis    Palpitations    PVC (premature ventricular contraction)      Family History  Problem Relation Age of Onset   Dementia Mother    Hyperlipidemia Mother    Heart disease Father    Hyperlipidemia Father    Hypertension Father    Lung disease Father        black lung- miner    Dementia Sister    Cancer Sister    Heart disease Sister    Hyperlipidemia Sister    Hypertension Sister    Dementia Maternal Grandmother    Breast cancer Other      Social History   Socioeconomic History   Marital status: Married    Spouse name: WL   Number of children: Not on file   Years of education: 12   Highest education level: Not on file  Occupational History   Not on file  Tobacco Use   Smoking status: Never   Smokeless tobacco: Never  Substance and Sexual Activity   Alcohol use: No    Alcohol/week: 0.0 standard drinks   Drug use: No   Sexual activity: Not on file  Other Topics Concern   Not on file  Social History Narrative   03/17/21 lives with husband   Social Determinants of Health   Financial Resource Strain: Not on file  Food Insecurity: Not on file  Transportation Needs: Not on file  Physical Activity: Not on file  Stress: Not on file  Social Connections: Not on file  Intimate Partner Violence: Not on file    Allergies  Allergen Reactions   Codeine     REACTION: Rash   Sulfonamide Derivatives     REACTION: Rash   Other Rash    Event monitor leads need....sensitive     Outpatient Medications Prior to Visit  Medication Sig Dispense Refill   Acetaminophen (TYLENOL PO) Take 1,000 mg by mouth daily as needed (pain/headache).      apixaban (ELIQUIS) 5 MG TABS tablet Take 1 tablet (5 mg total) by mouth 2 (two) times daily. 60 tablet 5   atorvastatin (LIPITOR) 20 MG tablet Take 20 mg by mouth daily.     Biotin 10000 MCG TABS Take 10,000 mcg by  mouth daily.     calcium citrate (CALCITRATE - DOSED IN MG ELEMENTAL CALCIUM) 950 MG tablet Take 200 mg of elemental calcium by mouth daily.     Cholecalciferol (VITAMIN D3) 5000 units CAPS Take 5,000 Units by mouth daily.     estradiol (ESTRACE) 0.1 MG/GM vaginal cream PLACE A PEA- SIZED AMOUNT IN THE VAGINA NIGHTLY FOR 2 WEEKS, THEN USE EVERY OTHER NIGHT     famotidine (PEPCID) 20 MG tablet Take 20 mg by mouth daily.     Loratadine (CLARITIN PO) Take 1 tablet by mouth daily.     Multiple Vitamins-Minerals (MULTIVITAMIN WITH MINERALS) tablet Take 1 tablet by mouth daily.     omeprazole (PRILOSEC) 20 MG capsule 1 capsule 30 minutes before morning meal     No facility-administered medications prior to visit.        Objective:   Physical Exam Vitals:   08/19/21 1135  BP: (!) 142/80  Pulse: 91  Temp: 98.2 F (36.8 C)  TempSrc: Oral  SpO2: 93%  Weight: 142 lb 9.6 oz (64.7 kg)  Height: 4\' 11"  (1.499 m)  Gen: Pleasant, well-nourished, in no distress,  normal affect  ENT: No lesions,  mouth clear,  oropharynx clear, no postnasal drip  Neck: No JVD, no stridor  Lungs: No use of accessory muscles, no crackles or wheezing on normal respiration, no wheeze on forced expiration  Cardiovascular: RRR, heart sounds normal, no murmur or gallops, no peripheral edema  Musculoskeletal: No deformities, no cyanosis or clubbing  Neuro: alert, awake, non focal  Skin: Warm, no lesions or rash      Assessment & Plan:  Asbestos exposure Documented exposure through her husband's work but her chest x-ray has been reassuring.  We have been following annually, will repeat today.  We will discuss results by phone.  GERD She is using H2 blockade, occasionally also uses omeprazole if her symptoms are active.  Overall stable.  Chronic rhinitis Intermittently uses fluticasone, just restarted it because her symptoms have been active.  She will use depending on frequency of cough, rhinitis.  Baltazar Apo, MD, PhD 08/19/2021, 12:09 PM [facility] [phone] or if no answer [phone]

## 2021-08-19 NOTE — Assessment & Plan Note (Signed)
She is using H2 blockade, occasionally also uses omeprazole if her symptoms are active.  Overall stable.

## 2021-08-19 NOTE — Patient Instructions (Signed)
We will repeat your chest x-ray today to follow for any changes associated with asbestos exposure. Continue your fluticasone nasal spray and loratadine as you have been taking them. Use your reflux medication if needed Follow with Dr. Lamonte Sakai in 1 year or sooner if you have any problems.

## 2021-09-01 DIAGNOSIS — M810 Age-related osteoporosis without current pathological fracture: Secondary | ICD-10-CM | POA: Diagnosis not present

## 2021-09-01 DIAGNOSIS — N1831 Chronic kidney disease, stage 3a: Secondary | ICD-10-CM | POA: Diagnosis not present

## 2021-09-01 DIAGNOSIS — K219 Gastro-esophageal reflux disease without esophagitis: Secondary | ICD-10-CM | POA: Diagnosis not present

## 2021-09-01 DIAGNOSIS — E7800 Pure hypercholesterolemia, unspecified: Secondary | ICD-10-CM | POA: Diagnosis not present

## 2021-10-08 DIAGNOSIS — L578 Other skin changes due to chronic exposure to nonionizing radiation: Secondary | ICD-10-CM | POA: Diagnosis not present

## 2021-10-08 DIAGNOSIS — B351 Tinea unguium: Secondary | ICD-10-CM | POA: Diagnosis not present

## 2021-10-08 DIAGNOSIS — Z09 Encounter for follow-up examination after completed treatment for conditions other than malignant neoplasm: Secondary | ICD-10-CM | POA: Diagnosis not present

## 2021-10-15 DIAGNOSIS — K219 Gastro-esophageal reflux disease without esophagitis: Secondary | ICD-10-CM | POA: Diagnosis not present

## 2021-10-15 DIAGNOSIS — N1831 Chronic kidney disease, stage 3a: Secondary | ICD-10-CM | POA: Diagnosis not present

## 2021-10-15 DIAGNOSIS — E7800 Pure hypercholesterolemia, unspecified: Secondary | ICD-10-CM | POA: Diagnosis not present

## 2021-10-15 DIAGNOSIS — M810 Age-related osteoporosis without current pathological fracture: Secondary | ICD-10-CM | POA: Diagnosis not present

## 2021-10-28 DIAGNOSIS — N1831 Chronic kidney disease, stage 3a: Secondary | ICD-10-CM | POA: Diagnosis not present

## 2021-10-28 DIAGNOSIS — E7800 Pure hypercholesterolemia, unspecified: Secondary | ICD-10-CM | POA: Diagnosis not present

## 2021-10-28 DIAGNOSIS — R5383 Other fatigue: Secondary | ICD-10-CM | POA: Diagnosis not present

## 2021-11-04 DIAGNOSIS — N1831 Chronic kidney disease, stage 3a: Secondary | ICD-10-CM | POA: Diagnosis not present

## 2021-11-11 ENCOUNTER — Ambulatory Visit
Admission: RE | Admit: 2021-11-11 | Discharge: 2021-11-11 | Disposition: A | Discharge status: Home / Self Care | Payer: PPO | Source: Ambulatory Visit | Attending: Surgery | Admitting: Surgery

## 2021-11-11 DIAGNOSIS — R928 Other abnormal and inconclusive findings on diagnostic imaging of breast: Secondary | ICD-10-CM | POA: Diagnosis not present

## 2021-11-11 DIAGNOSIS — N6489 Other specified disorders of breast: Secondary | ICD-10-CM

## 2021-11-11 IMAGING — MG DIGITAL DIAGNOSTIC BILAT W/ TOMO W/ CAD
8 series · 8 of 24 positions shown · non-contrast
Comparison: Previous exam(s).

CLINICAL DATA: 87-year-old female who had a stereotactic core
biopsy of the left breast performed on [DATE] which demonstrated
a complex sclerosing lesion and focal apocrine atypia in the left
breast. Per note from Dr. REMMO DRAMR follow-up was preferred over
lumpectomy given the patient's age. In [DATE], the patient
had dural venous sinus thrombosis.

EXAM:
DIGITAL DIAGNOSTIC BILATERAL MAMMOGRAM WITH TOMOSYNTHESIS AND CAD
TECHNIQUE: Bilateral digital diagnostic mammography and breast tomosynthesis
was performed. The images were evaluated with computer-aided
detection.

[R MLO synth-2D]
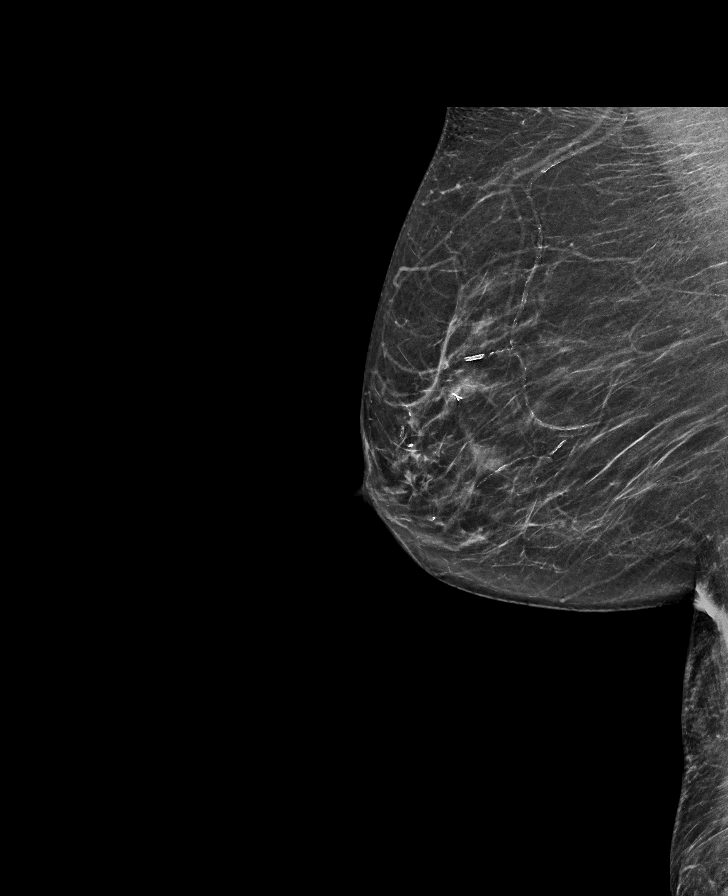

[L CC synth-2D]
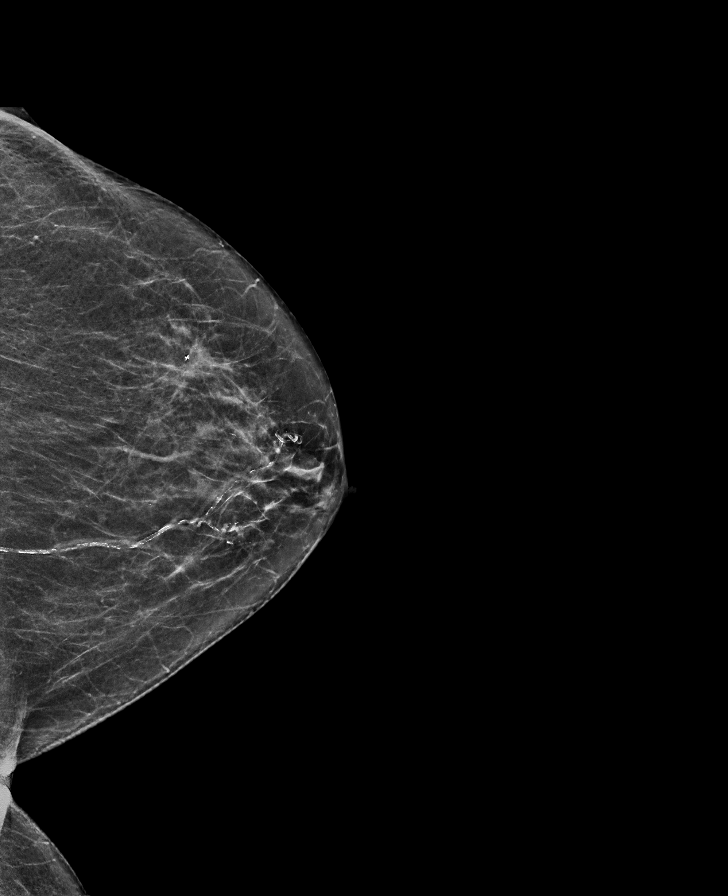

[R CC synth-2D]
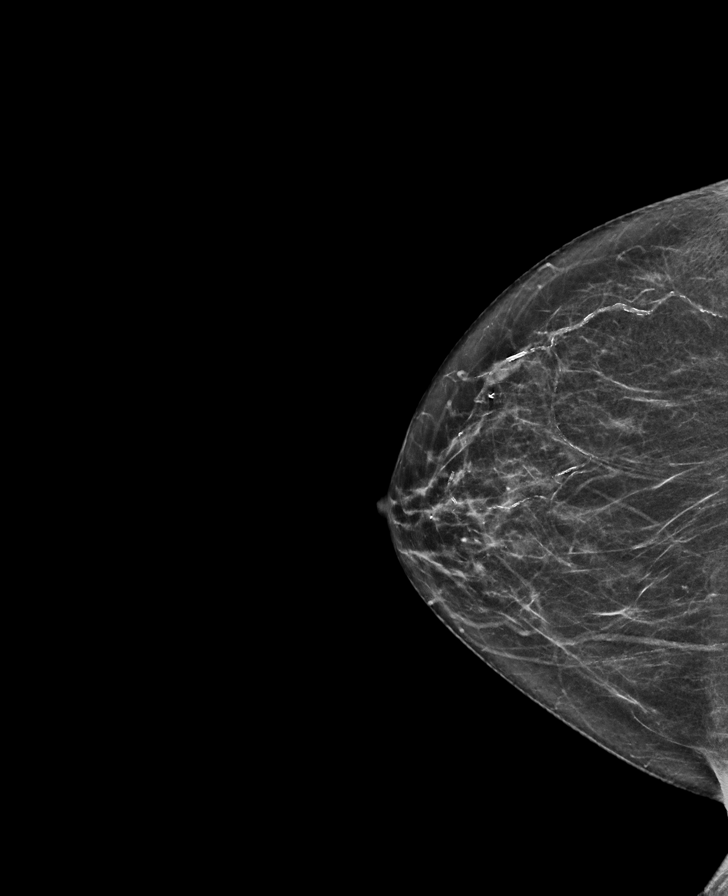

[L MLO synth-2D]
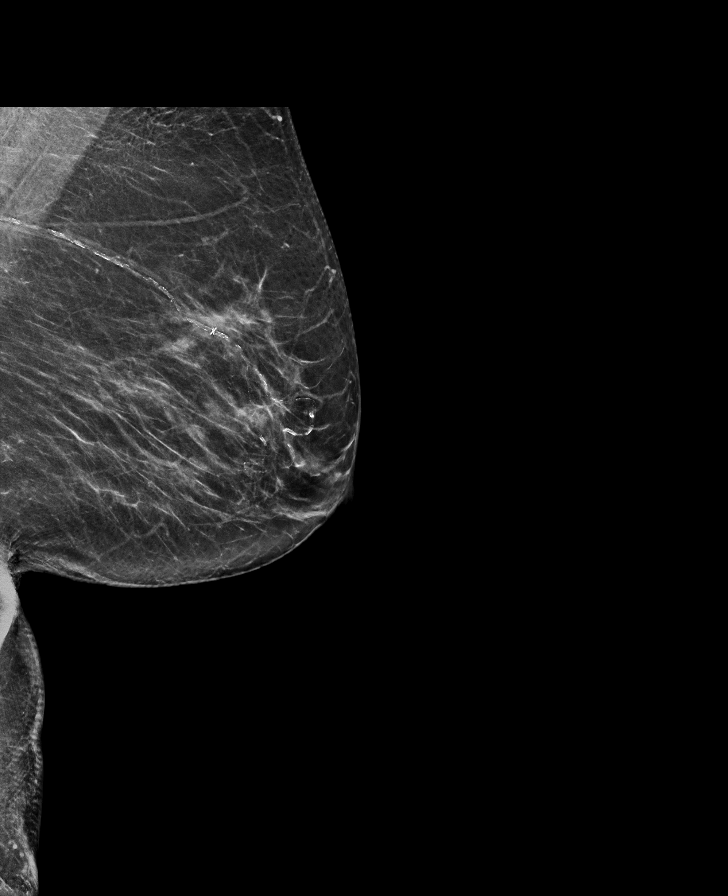

[L CC tomo · tomo slice 30/59.0]
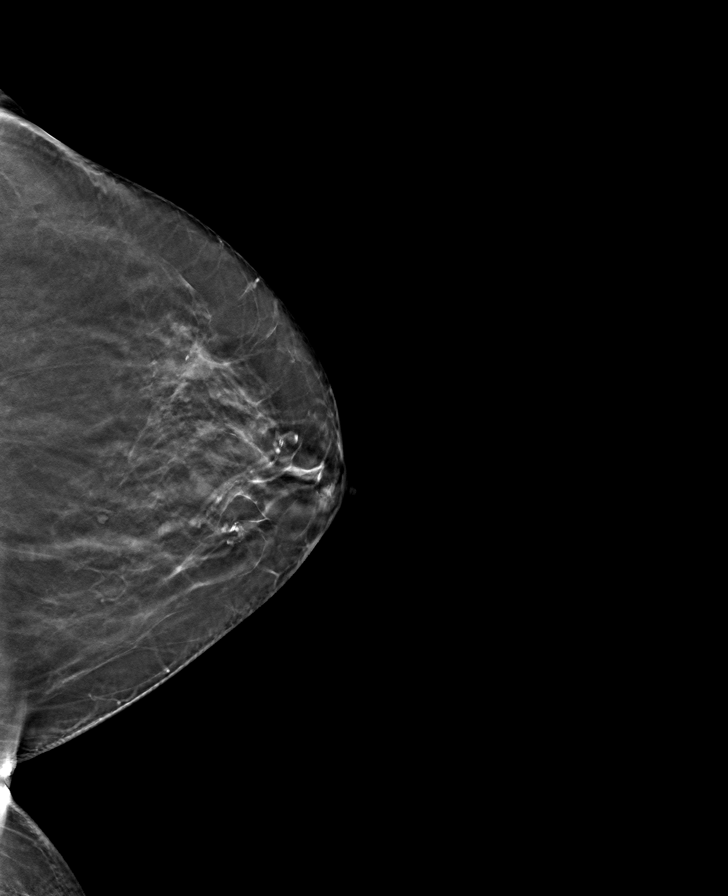

[R MLO tomo · tomo slice 32/63.0]
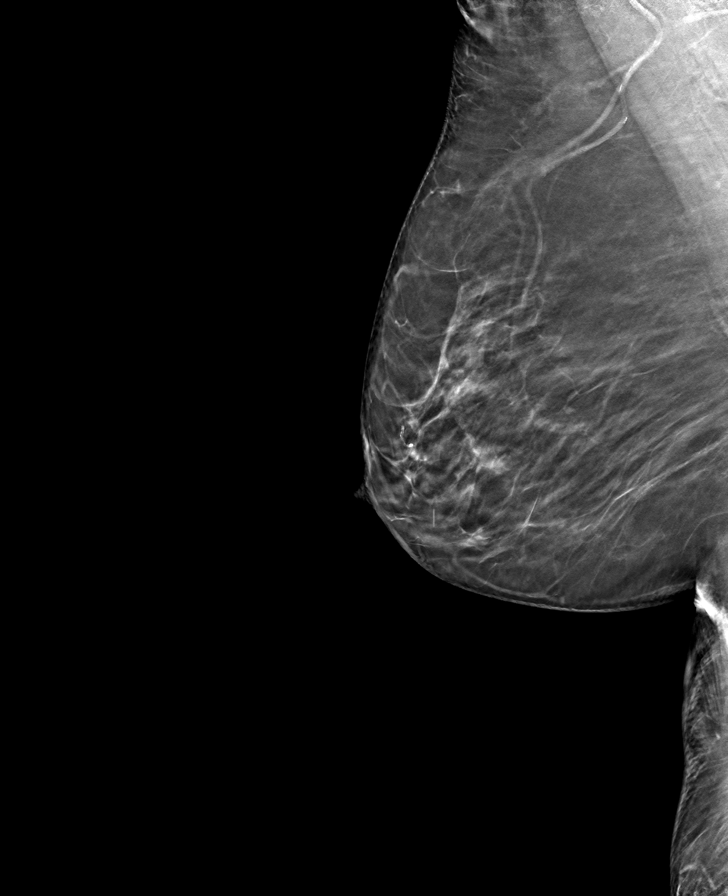

[R CC tomo · tomo slice 30/59.0]
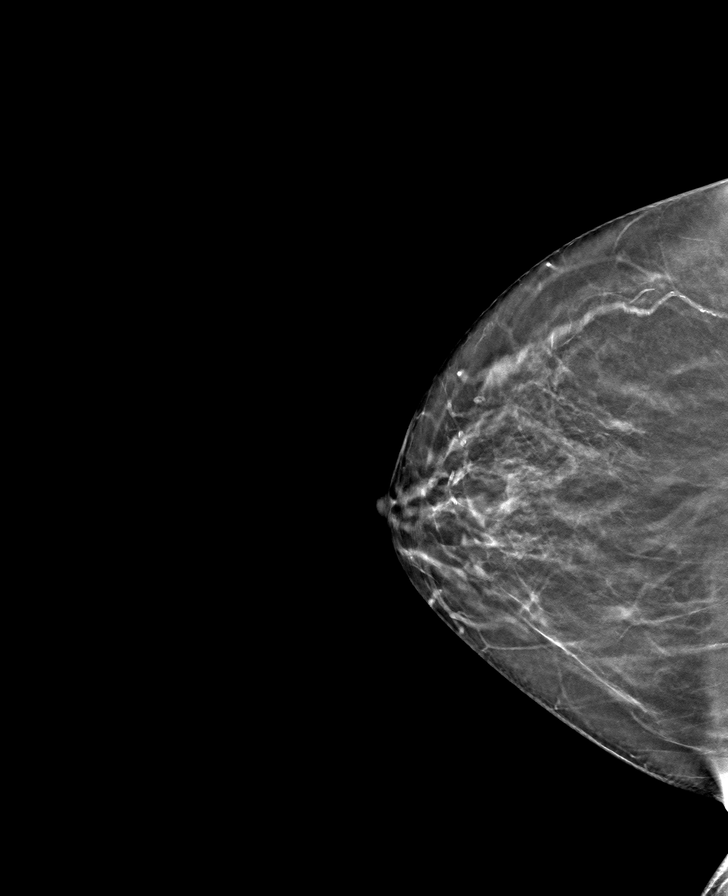

[L MLO tomo · tomo slice 33/65.0]
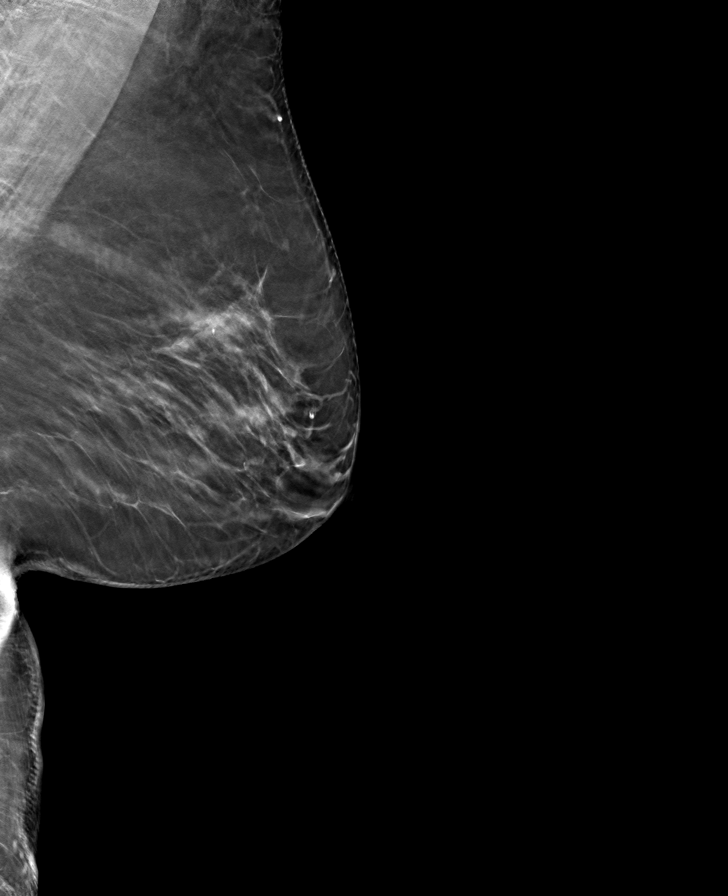

[8 of 24 positions shown; findings below may reference images not displayed]

ACR Breast Density Category b: There are scattered areas of
fibroglandular density.
FINDINGS: X shaped biopsy marker clip is again noted in the upper-outer
quadrant of the left breast. There is persistent distortion in this
region. Parenchymal pattern is otherwise stable. No suspicious mass
or malignant type microcalcifications identified in the right
breast.
IMPRESSION: Stable appearance of the distortion in the upper-outer quadrant of
the left breast.

RECOMMENDATION:
Bilateral diagnostic mammogram in 1 year is recommended to document
stability of the left breast for 2 years.

I have discussed the findings and recommendations with the patient.
If applicable, a reminder letter will be sent to the patient
regarding the next appointment.

BI-RADS CATEGORY  2: Benign.

## 2021-11-26 DIAGNOSIS — K219 Gastro-esophageal reflux disease without esophagitis: Secondary | ICD-10-CM | POA: Diagnosis not present

## 2021-11-26 DIAGNOSIS — E7800 Pure hypercholesterolemia, unspecified: Secondary | ICD-10-CM | POA: Diagnosis not present

## 2021-11-26 DIAGNOSIS — M810 Age-related osteoporosis without current pathological fracture: Secondary | ICD-10-CM | POA: Diagnosis not present

## 2021-11-26 DIAGNOSIS — N1831 Chronic kidney disease, stage 3a: Secondary | ICD-10-CM | POA: Diagnosis not present

## 2021-12-15 DIAGNOSIS — S80862A Insect bite (nonvenomous), left lower leg, initial encounter: Secondary | ICD-10-CM | POA: Diagnosis not present

## 2021-12-15 DIAGNOSIS — W57XXXA Bitten or stung by nonvenomous insect and other nonvenomous arthropods, initial encounter: Secondary | ICD-10-CM | POA: Diagnosis not present

## 2021-12-23 DIAGNOSIS — S80862A Insect bite (nonvenomous), left lower leg, initial encounter: Secondary | ICD-10-CM | POA: Diagnosis not present

## 2021-12-23 DIAGNOSIS — W57XXXA Bitten or stung by nonvenomous insect and other nonvenomous arthropods, initial encounter: Secondary | ICD-10-CM | POA: Diagnosis not present

## 2022-02-16 DIAGNOSIS — D6869 Other thrombophilia: Secondary | ICD-10-CM | POA: Diagnosis not present

## 2022-02-16 DIAGNOSIS — E663 Overweight: Secondary | ICD-10-CM | POA: Diagnosis not present

## 2022-02-16 DIAGNOSIS — M810 Age-related osteoporosis without current pathological fracture: Secondary | ICD-10-CM | POA: Diagnosis not present

## 2022-02-16 DIAGNOSIS — E559 Vitamin D deficiency, unspecified: Secondary | ICD-10-CM | POA: Diagnosis not present

## 2022-02-16 DIAGNOSIS — L570 Actinic keratosis: Secondary | ICD-10-CM | POA: Diagnosis not present

## 2022-02-16 DIAGNOSIS — E785 Hyperlipidemia, unspecified: Secondary | ICD-10-CM | POA: Diagnosis not present

## 2022-02-16 DIAGNOSIS — H9193 Unspecified hearing loss, bilateral: Secondary | ICD-10-CM | POA: Diagnosis not present

## 2022-02-16 DIAGNOSIS — J309 Allergic rhinitis, unspecified: Secondary | ICD-10-CM | POA: Diagnosis not present

## 2022-02-16 DIAGNOSIS — M1990 Unspecified osteoarthritis, unspecified site: Secondary | ICD-10-CM | POA: Diagnosis not present

## 2022-02-16 DIAGNOSIS — N183 Chronic kidney disease, stage 3 unspecified: Secondary | ICD-10-CM | POA: Diagnosis not present

## 2022-02-16 DIAGNOSIS — K219 Gastro-esophageal reflux disease without esophagitis: Secondary | ICD-10-CM | POA: Diagnosis not present

## 2022-02-16 DIAGNOSIS — I10 Essential (primary) hypertension: Secondary | ICD-10-CM | POA: Diagnosis not present

## 2022-02-25 DIAGNOSIS — R300 Dysuria: Secondary | ICD-10-CM | POA: Diagnosis not present

## 2022-02-25 DIAGNOSIS — M810 Age-related osteoporosis without current pathological fracture: Secondary | ICD-10-CM | POA: Diagnosis not present

## 2022-02-25 DIAGNOSIS — E7800 Pure hypercholesterolemia, unspecified: Secondary | ICD-10-CM | POA: Diagnosis not present

## 2022-02-25 DIAGNOSIS — K219 Gastro-esophageal reflux disease without esophagitis: Secondary | ICD-10-CM | POA: Diagnosis not present

## 2022-02-25 DIAGNOSIS — N1831 Chronic kidney disease, stage 3a: Secondary | ICD-10-CM | POA: Diagnosis not present

## 2022-03-03 DIAGNOSIS — H04123 Dry eye syndrome of bilateral lacrimal glands: Secondary | ICD-10-CM | POA: Diagnosis not present

## 2022-03-03 DIAGNOSIS — H02825 Cysts of left lower eyelid: Secondary | ICD-10-CM | POA: Diagnosis not present

## 2022-03-03 DIAGNOSIS — H10413 Chronic giant papillary conjunctivitis, bilateral: Secondary | ICD-10-CM | POA: Diagnosis not present

## 2022-03-03 DIAGNOSIS — Z961 Presence of intraocular lens: Secondary | ICD-10-CM | POA: Diagnosis not present

## 2022-03-03 DIAGNOSIS — D3131 Benign neoplasm of right choroid: Secondary | ICD-10-CM | POA: Diagnosis not present

## 2022-03-03 DIAGNOSIS — H35363 Drusen (degenerative) of macula, bilateral: Secondary | ICD-10-CM | POA: Diagnosis not present

## 2022-03-03 DIAGNOSIS — H4321 Crystalline deposits in vitreous body, right eye: Secondary | ICD-10-CM | POA: Diagnosis not present

## 2022-03-18 DIAGNOSIS — Z23 Encounter for immunization: Secondary | ICD-10-CM | POA: Diagnosis not present

## 2022-05-12 DIAGNOSIS — R030 Elevated blood-pressure reading, without diagnosis of hypertension: Secondary | ICD-10-CM | POA: Diagnosis not present

## 2022-05-12 DIAGNOSIS — K219 Gastro-esophageal reflux disease without esophagitis: Secondary | ICD-10-CM | POA: Diagnosis not present

## 2022-05-12 DIAGNOSIS — Z79899 Other long term (current) drug therapy: Secondary | ICD-10-CM | POA: Diagnosis not present

## 2022-05-12 DIAGNOSIS — Z0000 Encounter for general adult medical examination without abnormal findings: Secondary | ICD-10-CM | POA: Diagnosis not present

## 2022-05-12 DIAGNOSIS — E7800 Pure hypercholesterolemia, unspecified: Secondary | ICD-10-CM | POA: Diagnosis not present

## 2022-05-12 DIAGNOSIS — M810 Age-related osteoporosis without current pathological fracture: Secondary | ICD-10-CM | POA: Diagnosis not present

## 2022-07-14 DIAGNOSIS — L814 Other melanin hyperpigmentation: Secondary | ICD-10-CM | POA: Diagnosis not present

## 2022-07-14 DIAGNOSIS — D225 Melanocytic nevi of trunk: Secondary | ICD-10-CM | POA: Diagnosis not present

## 2022-07-14 DIAGNOSIS — L570 Actinic keratosis: Secondary | ICD-10-CM | POA: Diagnosis not present

## 2022-07-14 DIAGNOSIS — Z09 Encounter for follow-up examination after completed treatment for conditions other than malignant neoplasm: Secondary | ICD-10-CM | POA: Diagnosis not present

## 2022-07-14 DIAGNOSIS — L309 Dermatitis, unspecified: Secondary | ICD-10-CM | POA: Diagnosis not present

## 2022-07-14 DIAGNOSIS — L821 Other seborrheic keratosis: Secondary | ICD-10-CM | POA: Diagnosis not present

## 2022-07-16 DIAGNOSIS — K219 Gastro-esophageal reflux disease without esophagitis: Secondary | ICD-10-CM | POA: Diagnosis not present

## 2022-07-16 DIAGNOSIS — E7800 Pure hypercholesterolemia, unspecified: Secondary | ICD-10-CM | POA: Diagnosis not present

## 2022-07-16 DIAGNOSIS — M810 Age-related osteoporosis without current pathological fracture: Secondary | ICD-10-CM | POA: Diagnosis not present

## 2022-07-16 DIAGNOSIS — N1831 Chronic kidney disease, stage 3a: Secondary | ICD-10-CM | POA: Diagnosis not present

## 2022-08-19 ENCOUNTER — Ambulatory Visit: Payer: PPO | Admitting: Emergency Medicine

## 2022-08-19 ENCOUNTER — Encounter: Payer: Self-pay | Admitting: Emergency Medicine

## 2022-08-19 VITALS — BP 120/70 | HR 70 | Temp 97.6°F | Ht 59.0 in | Wt 145.8 lb

## 2022-08-19 DIAGNOSIS — R053 Chronic cough: Secondary | ICD-10-CM | POA: Diagnosis not present

## 2022-08-19 DIAGNOSIS — Z77090 Contact with and (suspected) exposure to asbestos: Secondary | ICD-10-CM

## 2022-08-19 NOTE — Progress Notes (Signed)
Subjective:    Patient ID: Hannah Bates, female    DOB: 1934-06-30, 89 y.o.   MRN: [id number]   HPI  ROV 08/19/21 --89 year old woman, never smoker with history of allergic rhinitis and GERD with some associated cough.  I have followed her and also her husband for asbestos exposure.  She was clearly exposed by his clothing when he was working with the material.  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  Last chest x-ray I have available was from 2 years ago, was clear. She reports that she was admitted for COVID and cerebral venous thrombus since last time (02/2021), was treated with Eliquis and is now on ASA. She reports that her breathing is doing well. Her fatigue and HA are both better. She is having some increased drainage and cough - just started flonase yesterday. Is on loratadine. She is also on pepcid, is not having any breakthrough.   ROV 08/19/22 --follow-up visit for 89 year old woman, never smoker with allergic rhinitis, GERD and some associated cough.  She has an asbestos exposure related principally to her husband's work with the material.  She also has coal dust exposure because she grew up in a coal mining family.  Her chest x-rays have been reassuring.  She describes today some occasional cough, some globus sensation. She has a lot of rhinitis. GERD ok on pepcid, rarely uses TUMS.     Review of Systems  Constitutional:  Negative for activity change, fatigue and fever.  HENT:  Negative for congestion, postnasal drip and sore throat.   Respiratory:  Negative for cough, chest tightness and shortness of breath.   Cardiovascular:  Negative for chest pain.  Gastrointestinal:  Negative for abdominal pain.  Musculoskeletal:  Negative for arthralgias, joint swelling and myalgias.  Neurological:  Negative for dizziness and seizures.   Past Medical History:  Diagnosis Date   Arthritis    Osteoporosis    Palpitations    PVC  (premature ventricular contraction)      Family History  Problem Relation Age of Onset   Dementia Mother    Hyperlipidemia Mother    Heart disease Father    Hyperlipidemia Father    Hypertension Father    Lung disease Father        black lung- miner    Dementia Sister    Cancer Sister    Heart disease Sister    Hyperlipidemia Sister    Hypertension Sister    Dementia Maternal Grandmother    Breast cancer Other      Social History   Socioeconomic History   Marital status: Married    Spouse name: WL   Number of children: Not on file   Years of education: 12   Highest education level: Not on file  Occupational History   Not on file  Tobacco Use   Smoking status: Never   Smokeless tobacco: Never  Substance and Sexual Activity   Alcohol use: No    Alcohol/week: 0.0 standard drinks of alcohol   Drug use: No   Sexual activity: Not on file  Other Topics Concern   Not on file  Social History Narrative   03/17/21 lives with husband   Social Determinants of Health   Financial Resource Strain: Not on file  Food Insecurity: Not on file  Transportation Needs: Not on file  Physical Activity: Not on file  Stress: Not on file  Social Connections: Not on file  Intimate Partner Violence: Not on file    Allergies  Allergen Reactions   Codeine     REACTION: Rash   Sulfonamide Derivatives     REACTION: Rash   Other Rash    Event monitor leads need....sensitive     Outpatient Medications Prior to Visit  Medication Sig Dispense Refill   Acetaminophen (TYLENOL PO) Take 1,000 mg by mouth daily as needed (pain/headache).      atorvastatin (LIPITOR) 20 MG tablet Take 20 mg by mouth daily.     Biotin 10000 MCG TABS Take 10,000 mcg by mouth daily.     calcium citrate (CALCITRATE - DOSED IN MG ELEMENTAL CALCIUM) 950 MG tablet Take 200 mg of elemental calcium by mouth daily.     Cholecalciferol (VITAMIN D3) 5000 units CAPS Take 5,000 Units by mouth daily.     famotidine  (PEPCID) 20 MG tablet Take 20 mg by mouth daily.     Loratadine (CLARITIN PO) Take 1 tablet by mouth daily.     Multiple Vitamins-Minerals (MULTIVITAMIN WITH MINERALS) tablet Take 1 tablet by mouth daily.     apixaban (ELIQUIS) 5 MG TABS tablet Take 1 tablet (5 mg total) by mouth 2 (two) times daily. 60 tablet 5   estradiol (ESTRACE) 0.1 MG/GM vaginal cream PLACE A PEA- SIZED AMOUNT IN THE VAGINA NIGHTLY FOR 2 WEEKS, THEN USE EVERY OTHER NIGHT     omeprazole (PRILOSEC) 20 MG capsule 1 capsule 30 minutes before morning meal     No facility-administered medications prior to visit.        Objective:   Physical Exam Vitals:   08/19/22 1305  BP: 120/70  Pulse: 70  Temp: 97.6 F (36.4 C)  TempSrc: Oral  SpO2: 98%  Weight: 145 lb 12.8 oz (66.1 kg)  Height: 4' 11"$  (1.499 m)  Gen: Pleasant, well-nourished, in no distress,  normal affect  ENT: No lesions,  mouth clear,  oropharynx clear, no postnasal drip  Neck: No JVD, no stridor  Lungs: No use of accessory muscles, no crackles or wheezing on normal respiration, no wheeze on forced expiration  Cardiovascular: RRR, heart sounds normal, no murmur or gallops, no peripheral edema  Musculoskeletal: No deformities, no cyanosis or clubbing  Neuro: alert, awake, non focal  Skin: Warm, no lesions or rash      Assessment & Plan:  Asbestos exposure She has an asbestos exposure through her husband's work.  Chest x-ray is always been clear.  She does not have any crackles on exam.  I think we can start following her chest x-ray every other year, defer until 2025 at her next office visit.  Chronic cough Chronic intermittent cough, not terribly bothersome but does persist.  Plan to continue her current therapy for GERD which is Pepcid, rhinitis which is loratadine.  Baltazar Apo, MD, PhD 08/19/2022, 1:26 PM [facility] [phone] or if no answer [phone]

## 2022-08-19 NOTE — Assessment & Plan Note (Signed)
She has an asbestos exposure through her husband's work.  Chest x-ray is always been clear.  She does not have any crackles on exam.  I think we can start following her chest x-ray every other year, defer until 2025 at her next office visit.

## 2022-08-19 NOTE — Assessment & Plan Note (Signed)
Chronic intermittent cough, not terribly bothersome but does persist.  Plan to continue her current therapy for GERD which is Pepcid, rhinitis which is loratadine.

## 2022-08-19 NOTE — Patient Instructions (Addendum)
We will plan to repeat your chest x-ray at your office visit next year Continue your Pepcid as you have been taking it Continue loratadine 10 mg once daily Follow Dr. Lamonte Sakai in 1 year with your chest x-ray, sooner if you have any problems.

## 2022-08-19 NOTE — Addendum Note (Signed)
Addended by: Lorretta Harp on: 08/19/2022 01:44 PM   Modules accepted: Orders

## 2022-09-29 ENCOUNTER — Other Ambulatory Visit: Payer: Self-pay | Admitting: Family Medicine

## 2022-09-29 DIAGNOSIS — R928 Other abnormal and inconclusive findings on diagnostic imaging of breast: Secondary | ICD-10-CM

## 2022-09-29 DIAGNOSIS — Z1231 Encounter for screening mammogram for malignant neoplasm of breast: Secondary | ICD-10-CM

## 2022-10-20 DIAGNOSIS — W57XXXA Bitten or stung by nonvenomous insect and other nonvenomous arthropods, initial encounter: Secondary | ICD-10-CM | POA: Diagnosis not present

## 2022-10-20 DIAGNOSIS — Z77090 Contact with and (suspected) exposure to asbestos: Secondary | ICD-10-CM | POA: Diagnosis not present

## 2022-10-20 DIAGNOSIS — I10 Essential (primary) hypertension: Secondary | ICD-10-CM | POA: Diagnosis not present

## 2022-10-20 DIAGNOSIS — E785 Hyperlipidemia, unspecified: Secondary | ICD-10-CM | POA: Diagnosis not present

## 2022-10-20 DIAGNOSIS — R7989 Other specified abnormal findings of blood chemistry: Secondary | ICD-10-CM | POA: Diagnosis not present

## 2022-10-20 DIAGNOSIS — E782 Mixed hyperlipidemia: Secondary | ICD-10-CM | POA: Diagnosis not present

## 2022-10-20 DIAGNOSIS — K219 Gastro-esophageal reflux disease without esophagitis: Secondary | ICD-10-CM | POA: Diagnosis not present

## 2022-10-20 DIAGNOSIS — E559 Vitamin D deficiency, unspecified: Secondary | ICD-10-CM | POA: Diagnosis not present

## 2022-10-29 DIAGNOSIS — M7072 Other bursitis of hip, left hip: Secondary | ICD-10-CM | POA: Diagnosis not present

## 2022-10-29 DIAGNOSIS — M1712 Unilateral primary osteoarthritis, left knee: Secondary | ICD-10-CM | POA: Diagnosis not present

## 2022-11-15 ENCOUNTER — Ambulatory Visit
Admission: RE | Admit: 2022-11-15 | Discharge: 2022-11-15 | Disposition: A | Discharge status: Home / Self Care | Payer: PPO | Source: Ambulatory Visit | Attending: Family Medicine | Admitting: Family Medicine

## 2022-11-15 DIAGNOSIS — R928 Other abnormal and inconclusive findings on diagnostic imaging of breast: Secondary | ICD-10-CM

## 2022-11-16 DIAGNOSIS — M6281 Muscle weakness (generalized): Secondary | ICD-10-CM | POA: Diagnosis not present

## 2022-11-16 DIAGNOSIS — M25552 Pain in left hip: Secondary | ICD-10-CM | POA: Diagnosis not present

## 2022-11-19 DIAGNOSIS — M6281 Muscle weakness (generalized): Secondary | ICD-10-CM | POA: Diagnosis not present

## 2022-11-19 DIAGNOSIS — R944 Abnormal results of kidney function studies: Secondary | ICD-10-CM | POA: Diagnosis not present

## 2022-11-19 DIAGNOSIS — M25552 Pain in left hip: Secondary | ICD-10-CM | POA: Diagnosis not present

## 2022-11-19 DIAGNOSIS — I10 Essential (primary) hypertension: Secondary | ICD-10-CM | POA: Diagnosis not present

## 2022-11-19 DIAGNOSIS — E559 Vitamin D deficiency, unspecified: Secondary | ICD-10-CM | POA: Diagnosis not present

## 2022-11-23 DIAGNOSIS — E559 Vitamin D deficiency, unspecified: Secondary | ICD-10-CM | POA: Diagnosis not present

## 2022-11-23 DIAGNOSIS — R7989 Other specified abnormal findings of blood chemistry: Secondary | ICD-10-CM | POA: Diagnosis not present

## 2022-11-23 DIAGNOSIS — I10 Essential (primary) hypertension: Secondary | ICD-10-CM | POA: Diagnosis not present

## 2022-11-23 DIAGNOSIS — U071 COVID-19: Secondary | ICD-10-CM | POA: Diagnosis not present

## 2022-11-23 DIAGNOSIS — M25552 Pain in left hip: Secondary | ICD-10-CM | POA: Diagnosis not present

## 2022-11-23 DIAGNOSIS — M6281 Muscle weakness (generalized): Secondary | ICD-10-CM | POA: Diagnosis not present

## 2022-11-24 DIAGNOSIS — Z23 Encounter for immunization: Secondary | ICD-10-CM | POA: Diagnosis not present

## 2022-12-01 DIAGNOSIS — M6281 Muscle weakness (generalized): Secondary | ICD-10-CM | POA: Diagnosis not present

## 2022-12-01 DIAGNOSIS — M25552 Pain in left hip: Secondary | ICD-10-CM | POA: Diagnosis not present

## 2022-12-03 DIAGNOSIS — M25552 Pain in left hip: Secondary | ICD-10-CM | POA: Diagnosis not present

## 2022-12-03 DIAGNOSIS — M6281 Muscle weakness (generalized): Secondary | ICD-10-CM | POA: Diagnosis not present

## 2022-12-07 DIAGNOSIS — M25552 Pain in left hip: Secondary | ICD-10-CM | POA: Diagnosis not present

## 2022-12-07 DIAGNOSIS — M6281 Muscle weakness (generalized): Secondary | ICD-10-CM | POA: Diagnosis not present

## 2022-12-10 DIAGNOSIS — M25552 Pain in left hip: Secondary | ICD-10-CM | POA: Diagnosis not present

## 2022-12-10 DIAGNOSIS — M6281 Muscle weakness (generalized): Secondary | ICD-10-CM | POA: Diagnosis not present

## 2023-03-08 DIAGNOSIS — E782 Mixed hyperlipidemia: Secondary | ICD-10-CM | POA: Diagnosis not present

## 2023-03-08 DIAGNOSIS — Z77090 Contact with and (suspected) exposure to asbestos: Secondary | ICD-10-CM | POA: Diagnosis not present

## 2023-03-08 DIAGNOSIS — U071 COVID-19: Secondary | ICD-10-CM | POA: Diagnosis not present

## 2023-03-09 DIAGNOSIS — U071 COVID-19: Secondary | ICD-10-CM | POA: Diagnosis not present

## 2023-03-09 DIAGNOSIS — J069 Acute upper respiratory infection, unspecified: Secondary | ICD-10-CM | POA: Diagnosis not present

## 2023-04-20 DIAGNOSIS — Z23 Encounter for immunization: Secondary | ICD-10-CM | POA: Diagnosis not present

## 2023-04-21 DIAGNOSIS — H10413 Chronic giant papillary conjunctivitis, bilateral: Secondary | ICD-10-CM | POA: Diagnosis not present

## 2023-04-21 DIAGNOSIS — D3131 Benign neoplasm of right choroid: Secondary | ICD-10-CM | POA: Diagnosis not present

## 2023-04-21 DIAGNOSIS — Z961 Presence of intraocular lens: Secondary | ICD-10-CM | POA: Diagnosis not present

## 2023-04-21 DIAGNOSIS — H04123 Dry eye syndrome of bilateral lacrimal glands: Secondary | ICD-10-CM | POA: Diagnosis not present

## 2023-04-21 DIAGNOSIS — H02825 Cysts of left lower eyelid: Secondary | ICD-10-CM | POA: Diagnosis not present

## 2023-04-21 DIAGNOSIS — H4321 Crystalline deposits in vitreous body, right eye: Secondary | ICD-10-CM | POA: Diagnosis not present

## 2023-04-21 DIAGNOSIS — H35363 Drusen (degenerative) of macula, bilateral: Secondary | ICD-10-CM | POA: Diagnosis not present

## 2023-05-06 DIAGNOSIS — R030 Elevated blood-pressure reading, without diagnosis of hypertension: Secondary | ICD-10-CM | POA: Diagnosis not present

## 2023-05-06 DIAGNOSIS — S40811A Abrasion of right upper arm, initial encounter: Secondary | ICD-10-CM | POA: Diagnosis not present

## 2023-05-09 DIAGNOSIS — R5383 Other fatigue: Secondary | ICD-10-CM | POA: Diagnosis not present

## 2023-05-09 DIAGNOSIS — E559 Vitamin D deficiency, unspecified: Secondary | ICD-10-CM | POA: Diagnosis not present

## 2023-05-09 DIAGNOSIS — E785 Hyperlipidemia, unspecified: Secondary | ICD-10-CM | POA: Diagnosis not present

## 2023-05-16 DIAGNOSIS — I10 Essential (primary) hypertension: Secondary | ICD-10-CM | POA: Diagnosis not present

## 2023-05-16 DIAGNOSIS — Z789 Other specified health status: Secondary | ICD-10-CM | POA: Diagnosis not present

## 2023-05-16 DIAGNOSIS — E782 Mixed hyperlipidemia: Secondary | ICD-10-CM | POA: Diagnosis not present

## 2023-05-16 DIAGNOSIS — R030 Elevated blood-pressure reading, without diagnosis of hypertension: Secondary | ICD-10-CM | POA: Diagnosis not present

## 2023-05-16 DIAGNOSIS — M810 Age-related osteoporosis without current pathological fracture: Secondary | ICD-10-CM | POA: Diagnosis not present

## 2023-05-16 DIAGNOSIS — E559 Vitamin D deficiency, unspecified: Secondary | ICD-10-CM | POA: Diagnosis not present

## 2023-05-23 DIAGNOSIS — D1801 Hemangioma of skin and subcutaneous tissue: Secondary | ICD-10-CM | POA: Diagnosis not present

## 2023-05-23 DIAGNOSIS — D2272 Melanocytic nevi of left lower limb, including hip: Secondary | ICD-10-CM | POA: Diagnosis not present

## 2023-05-23 DIAGNOSIS — D225 Melanocytic nevi of trunk: Secondary | ICD-10-CM | POA: Diagnosis not present

## 2023-05-23 DIAGNOSIS — L821 Other seborrheic keratosis: Secondary | ICD-10-CM | POA: Diagnosis not present

## 2023-05-23 DIAGNOSIS — D2271 Melanocytic nevi of right lower limb, including hip: Secondary | ICD-10-CM | POA: Diagnosis not present

## 2023-05-23 DIAGNOSIS — L814 Other melanin hyperpigmentation: Secondary | ICD-10-CM | POA: Diagnosis not present

## 2023-08-18 ENCOUNTER — Other Ambulatory Visit: Payer: Self-pay | Admitting: Family Medicine

## 2023-08-18 DIAGNOSIS — N6489 Other specified disorders of breast: Secondary | ICD-10-CM

## 2023-08-19 ENCOUNTER — Other Ambulatory Visit: Payer: Self-pay | Admitting: Family Medicine

## 2023-08-19 DIAGNOSIS — N6489 Other specified disorders of breast: Secondary | ICD-10-CM

## 2023-08-19 DIAGNOSIS — M810 Age-related osteoporosis without current pathological fracture: Secondary | ICD-10-CM

## 2023-08-31 ENCOUNTER — Encounter: Payer: Self-pay | Admitting: Emergency Medicine

## 2023-08-31 ENCOUNTER — Ambulatory Visit: Payer: PPO | Admitting: Emergency Medicine

## 2023-08-31 ENCOUNTER — Ambulatory Visit: Payer: PPO

## 2023-08-31 VITALS — BP 150/80 | HR 78 | Temp 97.7°F | Ht 59.0 in | Wt 143.6 lb

## 2023-08-31 DIAGNOSIS — Z77090 Contact with and (suspected) exposure to asbestos: Secondary | ICD-10-CM

## 2023-08-31 DIAGNOSIS — J439 Emphysema, unspecified: Secondary | ICD-10-CM | POA: Diagnosis not present

## 2023-08-31 DIAGNOSIS — R053 Chronic cough: Secondary | ICD-10-CM

## 2023-08-31 NOTE — Assessment & Plan Note (Signed)
 Her chest x-ray has been clear in the past.  Will repeat to ensure no interval change.  If we see evidence for pleural plaques, ILD then we will obtain a dedicated CT chest

## 2023-08-31 NOTE — Patient Instructions (Addendum)
 Consider increasing your loratadine to 10 mg once daily on a schedule.  This may help your drainage and also your cough. Continue use your Pepcid and Tums as needed for reflux We will check a chest x-ray today Follow with Dr. Delton Coombes in 12 months or sooner if you have any problems.

## 2023-08-31 NOTE — Progress Notes (Signed)
 Subjective:    Patient ID: Hannah Bates, female    DOB: September 02, 1933, 89 y.o.   MRN: [id number]   HPI  ROV 08/31/2023 --89 year old woman, never smoker with cough in the setting of allergic rhinitis and GERD.  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  Her chest x-rays have always been reassuring. She is breathing well. She has intermittent cough w UA tickle. Can wake her from sleep. She has PND, takes loratadine prn. Uses pepcid prn. She has not needed TUMS often.   Review of Systems  Constitutional:  Negative for activity change, fatigue and fever.  HENT:  Negative for congestion, postnasal drip and sore throat.   Respiratory:  Negative for cough, chest tightness and shortness of breath.   Cardiovascular:  Negative for chest pain.  Gastrointestinal:  Negative for abdominal pain.  Musculoskeletal:  Negative for arthralgias, joint swelling and myalgias.  Neurological:  Negative for dizziness and seizures.   Past Medical History:  Diagnosis Date   Arthritis    Osteoporosis    Palpitations    PVC (premature ventricular contraction)      Family History  Problem Relation Age of Onset   Dementia Mother    Hyperlipidemia Mother    Heart disease Father    Hyperlipidemia Father    Hypertension Father    Lung disease Father        black lung- miner    Dementia Sister    Cancer Sister    Heart disease Sister    Hyperlipidemia Sister    Hypertension Sister    Dementia Maternal Grandmother    Breast cancer Other      Social History   Socioeconomic History   Marital status: Married    Spouse name: WL   Number of children: Not on file   Years of education: 12   Highest education level: Not on file  Occupational History   Not on file  Tobacco Use   Smoking status: Never   Smokeless tobacco: Never  Substance and Sexual Activity   Alcohol use: No    Alcohol/week: 0.0  standard drinks of alcohol   Drug use: No   Sexual activity: Not on file  Other Topics Concern   Not on file  Social History Narrative   03/17/21 lives with husband   Social Drivers of Corporate investment banker Strain: Not on file  Food Insecurity: Not on file  Transportation Needs: Not on file  Physical Activity: Not on file  Stress: Not on file  Social Connections: Not on file  Intimate Partner Violence: Not on file    Allergies  Allergen Reactions   Codeine     REACTION: Rash   Sulfonamide Derivatives     REACTION: Rash   Other Rash    Event monitor leads need....sensitive     Outpatient Medications Prior to Visit  Medication Sig Dispense Refill   Acetaminophen (TYLENOL PO) Take 1,000 mg by mouth daily as needed (pain/headache).      atorvastatin (LIPITOR) 20 MG tablet Take 20 mg by mouth daily.     Cholecalciferol (VITAMIN D3) 5000 units CAPS Take 5,000 Units by mouth daily.     famotidine (PEPCID) 20 MG tablet Take 20 mg by mouth daily.     Loratadine (CLARITIN PO) Take 1 tablet by mouth daily.     Multiple Vitamins-Minerals (MULTIVITAMIN WITH MINERALS) tablet Take 1 tablet  by mouth daily.     Biotin 16109 MCG TABS Take 10,000 mcg by mouth daily. (Patient not taking: Reported on 08/31/2023)     calcium citrate (CALCITRATE - DOSED IN MG ELEMENTAL CALCIUM) 950 MG tablet Take 200 mg of elemental calcium by mouth daily. (Patient not taking: Reported on 08/31/2023)     No facility-administered medications prior to visit.        Objective:   Physical Exam Vitals:   08/31/23 1336  BP: (!) 150/80  Pulse: 78  Temp: 97.7 F (36.5 C)  TempSrc: Oral  SpO2: 97%  Weight: 143 lb 9.6 oz (65.1 kg)  Height: 4\' 11"  (1.499 m)   Gen: Pleasant, well-nourished, in no distress,  normal affect  ENT: No lesions,  mouth clear,  oropharynx clear, no postnasal drip  Neck: No JVD, no stridor  Lungs: No use of accessory muscles, no crackles or wheezing on normal respiration, no  wheeze on forced expiration  Cardiovascular: RRR, heart sounds normal, no murmur or gallops, no peripheral edema  Musculoskeletal: No deformities, no cyanosis or clubbing  Neuro: alert, awake, non focal  Skin: Warm, no lesions or rash      Assessment & Plan:  Chronic cough With impact from both GERD and allergic rhinitis.  These are both somewhat mild but do impact her cough.  She has not been treating reliably as it does not seem like any of the issues are terribly bothersome.  Consider increasing your loratadine to 10 mg once daily on a schedule.  This may help your drainage and also your cough. Continue use your Pepcid and Tums as needed for reflux Follow with Dr. Delton Coombes in 12 months or sooner if you have any problems.    Asbestos exposure Her chest x-ray has been clear in the past.  Will repeat to ensure no interval change.  If we see evidence for pleural plaques, ILD then we will obtain a dedicated CT chest   Levy Pupa, MD, PhD 08/31/2023, 4:53 PM [facility] [phone] or if no answer [phone]

## 2023-08-31 NOTE — Assessment & Plan Note (Signed)
 With impact from both GERD and allergic rhinitis.  These are both somewhat mild but do impact her cough.  She has not been treating reliably as it does not seem like any of the issues are terribly bothersome.  Consider increasing your loratadine to 10 mg once daily on a schedule.  This may help your drainage and also your cough. Continue use your Pepcid and Tums as needed for reflux Follow with Dr. Delton Coombes in 12 months or sooner if you have any problems.

## 2023-09-17 ENCOUNTER — Ambulatory Visit
Admission: RE | Admit: 2023-09-17 | Discharge: 2023-09-17 | Disposition: A | Discharge status: Home / Self Care | Payer: PPO | Source: Ambulatory Visit | Attending: Family Medicine | Admitting: Family Medicine

## 2023-09-17 DIAGNOSIS — E2839 Other primary ovarian failure: Secondary | ICD-10-CM | POA: Diagnosis not present

## 2023-09-17 DIAGNOSIS — M810 Age-related osteoporosis without current pathological fracture: Secondary | ICD-10-CM

## 2023-09-17 DIAGNOSIS — N958 Other specified menopausal and perimenopausal disorders: Secondary | ICD-10-CM | POA: Diagnosis not present

## 2023-09-17 DIAGNOSIS — M8588 Other specified disorders of bone density and structure, other site: Secondary | ICD-10-CM | POA: Diagnosis not present

## 2023-09-17 DIAGNOSIS — Z8262 Family history of osteoporosis: Secondary | ICD-10-CM | POA: Diagnosis not present

## 2023-11-02 DIAGNOSIS — E785 Hyperlipidemia, unspecified: Secondary | ICD-10-CM | POA: Diagnosis not present

## 2023-11-02 DIAGNOSIS — E559 Vitamin D deficiency, unspecified: Secondary | ICD-10-CM | POA: Diagnosis not present

## 2023-11-02 DIAGNOSIS — R5383 Other fatigue: Secondary | ICD-10-CM | POA: Diagnosis not present

## 2023-11-08 DIAGNOSIS — R7989 Other specified abnormal findings of blood chemistry: Secondary | ICD-10-CM | POA: Diagnosis not present

## 2023-11-08 DIAGNOSIS — E782 Mixed hyperlipidemia: Secondary | ICD-10-CM | POA: Diagnosis not present

## 2023-11-08 DIAGNOSIS — E559 Vitamin D deficiency, unspecified: Secondary | ICD-10-CM | POA: Diagnosis not present

## 2023-11-08 DIAGNOSIS — J439 Emphysema, unspecified: Secondary | ICD-10-CM | POA: Diagnosis not present

## 2023-11-08 DIAGNOSIS — Z789 Other specified health status: Secondary | ICD-10-CM | POA: Diagnosis not present

## 2023-11-09 ENCOUNTER — Encounter (HOSPITAL_COMMUNITY): Payer: Self-pay

## 2023-11-09 ENCOUNTER — Telehealth: Payer: Self-pay

## 2023-11-09 NOTE — Telephone Encounter (Signed)
 No result note in chart for CXR from 08/31/23.  Re: follow up visit - OV 08/31/23 recommendation: Follow with Dr. Baldwin Levee in 12 months or sooner if you have any problems.   Please advise on CXR result. Thank you!

## 2023-11-09 NOTE — Telephone Encounter (Signed)
 Please let the patient know that her chest x-ray was normal.  I am sorry for the delay in getting her this information.  Good news.  She does not need any other testing until we follow-up

## 2023-11-09 NOTE — Telephone Encounter (Signed)
 Copied from CRM [id number]. Topic: Clinical - Lab/Test Results >> Nov 09, 2023  3:24 PM Isabell A wrote: Reason for CRM: Patient would like to discuss her xray results with Dr.Byrum or his nurse - attempt to make f/u appointment, no availability unril August & pt is concerned.   Callback number: [phone]

## 2023-11-10 NOTE — Telephone Encounter (Signed)
 Spoke with Hannah Bates. Advised pt of RB note. Pt checked her Mychart and went to Dr. Garth Kansky recently and showed them DG results and pt stated "they almost dropped to the floor" after seeing DG results pt had printed from her Mychart. They were concerned where it says Emphysema.  RB can you please advise with explanation regarding result reading of Emphysema on DG

## 2023-11-10 NOTE — Telephone Encounter (Signed)
 I reviewed the chest x-ray with the patient by phone this morning.  There is some evidence for possible hyperinflation with some flattened hemidiaphragms, etc.  That said she does not have a history of tobacco use, does not have a history of asthma, is not having any shortness of breath.  Explained to her that if she has a change in her functional capacity or breathing that we will get pulmonary function testing to better assess whether she may have some obstructive lung disease.  I do not think we are compelled to do that now since she is doing well.

## 2023-11-16 ENCOUNTER — Ambulatory Visit
Admission: RE | Admit: 2023-11-16 | Discharge: 2023-11-16 | Disposition: A | Discharge status: Home / Self Care | Payer: PPO | Source: Ambulatory Visit | Attending: Family Medicine | Admitting: Family Medicine

## 2023-11-16 DIAGNOSIS — N6032 Fibrosclerosis of left breast: Secondary | ICD-10-CM | POA: Diagnosis not present

## 2023-11-16 DIAGNOSIS — N6489 Other specified disorders of breast: Secondary | ICD-10-CM

## 2023-11-23 DIAGNOSIS — R030 Elevated blood-pressure reading, without diagnosis of hypertension: Secondary | ICD-10-CM | POA: Diagnosis not present

## 2023-11-23 DIAGNOSIS — Z0000 Encounter for general adult medical examination without abnormal findings: Secondary | ICD-10-CM | POA: Diagnosis not present

## 2023-11-23 DIAGNOSIS — Z1331 Encounter for screening for depression: Secondary | ICD-10-CM | POA: Diagnosis not present

## 2023-11-23 DIAGNOSIS — Z1339 Encounter for screening examination for other mental health and behavioral disorders: Secondary | ICD-10-CM | POA: Diagnosis not present

## 2023-12-09 ENCOUNTER — Emergency Department (HOSPITAL_BASED_OUTPATIENT_CLINIC_OR_DEPARTMENT_OTHER)
Admission: EM | Admit: 2023-12-09 | Discharge: 2023-12-09 | Disposition: A | Discharge status: Home / Self Care | Attending: Emergency Medicine | Admitting: Emergency Medicine

## 2023-12-09 ENCOUNTER — Encounter (HOSPITAL_BASED_OUTPATIENT_CLINIC_OR_DEPARTMENT_OTHER): Payer: Self-pay | Admitting: Emergency Medicine

## 2023-12-09 ENCOUNTER — Emergency Department (HOSPITAL_BASED_OUTPATIENT_CLINIC_OR_DEPARTMENT_OTHER)

## 2023-12-09 ENCOUNTER — Telehealth: Payer: Self-pay | Admitting: Adult Health

## 2023-12-09 DIAGNOSIS — I10 Essential (primary) hypertension: Secondary | ICD-10-CM | POA: Diagnosis not present

## 2023-12-09 DIAGNOSIS — R519 Headache, unspecified: Secondary | ICD-10-CM

## 2023-12-09 LAB — CBC WITH DIFFERENTIAL/PLATELET
Abs Immature Granulocytes: 0.02 10*3/uL (ref 0.00–0.07)
Basophils Absolute: 0.1 10*3/uL (ref 0.0–0.1)
Basophils Relative: 1 %
Eosinophils Absolute: 0.1 10*3/uL (ref 0.0–0.5)
Eosinophils Relative: 2 %
HCT: 43.5 % (ref 36.0–46.0)
Hemoglobin: 14.5 g/dL (ref 12.0–15.0)
Immature Granulocytes: 0 %
Lymphocytes Relative: 22 %
Lymphs Abs: 1.6 10*3/uL (ref 0.7–4.0)
MCH: 32.6 pg (ref 26.0–34.0)
MCHC: 33.3 g/dL (ref 30.0–36.0)
MCV: 97.8 fL (ref 80.0–100.0)
Monocytes Absolute: 0.7 10*3/uL (ref 0.1–1.0)
Monocytes Relative: 9 %
Neutro Abs: 4.9 10*3/uL (ref 1.7–7.7)
Neutrophils Relative %: 66 %
Platelets: 257 10*3/uL (ref 150–400)
RBC: 4.45 MIL/uL (ref 3.87–5.11)
RDW: 13.1 % (ref 11.5–15.5)
WBC: 7.4 10*3/uL (ref 4.0–10.5)
nRBC: 0 % (ref 0.0–0.2)

## 2023-12-09 LAB — BASIC METABOLIC PANEL WITH GFR
Anion gap: 12 (ref 5–15)
BUN: 14 mg/dL (ref 8–23)
CO2: 24 mmol/L (ref 22–32)
Calcium: 9.8 mg/dL (ref 8.9–10.3)
Chloride: 105 mmol/L (ref 98–111)
Creatinine, Ser: 1.12 mg/dL — ABNORMAL HIGH (ref 0.44–1.00)
GFR, Estimated: 47 mL/min — ABNORMAL LOW (ref >60)
Glucose, Bld: 97 mg/dL (ref 70–99)
Potassium: 4.1 mmol/L (ref 3.5–5.1)
Sodium: 142 mmol/L (ref 135–145)

## 2023-12-09 MED ORDER — IOHEXOL 300 MG/ML  SOLN
75.0000 mL | Freq: Once | INTRAMUSCULAR | Status: DC | PRN
Start: 2023-12-09 — End: 2023-12-09

## 2023-12-09 MED ORDER — IOHEXOL 350 MG/ML SOLN
75.0000 mL | Freq: Once | INTRAVENOUS | Status: AC | PRN
  Administered 2023-12-09: 75 mL via INTRAVENOUS

## 2023-12-09 NOTE — Telephone Encounter (Signed)
 Patient called into phone room  and stated  that thinks she has bloodclots in her head again, has not been to ER. Phone room unable to advise so I took call. Patient reports last week that her left side started feeling weird and her head felt like it was stuffed with cotton. She denies slurred speech but states she is feeling the same as when she had prior stroke. She was also asking about whether she should be taking aspirin daily. Advised I would send her a mychart message but she should go to the ER. Patient verbalized understanding. Patient last visit here was 07/21/2021

## 2023-12-09 NOTE — Discharge Instructions (Signed)
 Patient's headache is very mild.  CT head CT venogram both negative for any acute findings.  Which is reassuring.

## 2023-12-09 NOTE — ED Notes (Signed)
 DC paperwork given and verbally understood.

## 2023-12-09 NOTE — Telephone Encounter (Signed)
 error

## 2023-12-09 NOTE — ED Triage Notes (Signed)
 C/o headache x 1 week with nausea. States similar episode last year nad had "blood clot in head" No focal deficits.   States head "feels stuffed with cotton"

## 2023-12-09 NOTE — ED Notes (Addendum)
 Answered call light for the bathroom to assist pt back to the bed. Pt advised the bp cuff was hurting her arm, put the cuff on the other arm and cycled bp, pt advised it hurt worse on left arm, put bp cuff back right arm, pt is satisfied.

## 2023-12-09 NOTE — ED Provider Notes (Addendum)
 [place] EMERGENCY DEPARTMENT AT [facility] Provider Note   CSN: [id number] Arrival date & time: 12/09/23  1045     History  Chief Complaint  Patient presents with   Headache    Hannah Bates is a 89 y.o. female.  Patient with approximately 1 week feeling of something funny in the head.  Very similar to when she had a venous sinus thrombosis in her head in August 2022.  Blood pressure is high today as well 198/80.  Patient states she does not need anything for headache at this point in time.  But it is sort of a mild headache.  No nausea no vomiting.  Patient currently taking a baby aspirin every other day.  Patient not on Eliquis  anymore has been off that for a while.  Patient is followed by neurology.  Past medical history significant for premature ventricular contractions and palpitations.  Patient's also had gallbladder removed.  And is never used tobacco products.       Home Medications Prior to Admission medications   Medication Sig Start Date End Date Taking? Authorizing Provider  Acetaminophen  (TYLENOL  PO) Take 1,000 mg by mouth daily as needed (pain/headache).     [provider]  atorvastatin  (LIPITOR) 20 MG tablet Take 20 mg by mouth daily.    [provider]  Cholecalciferol  (VITAMIN D3) 5000 units CAPS Take 5,000 Units by mouth daily.    [provider]  famotidine  (PEPCID ) 20 MG tablet Take 20 mg by mouth daily.    [provider]  Loratadine (CLARITIN PO) Take 1 tablet by mouth daily.    [provider]  Multiple Vitamins-Minerals (MULTIVITAMIN WITH MINERALS) tablet Take 1 tablet by mouth daily.    [provider]  dabigatran  (PRADAXA ) 150 MG CAPS capsule Take 1 capsule (150 mg total) by mouth 2 (two) times daily. 02/05/21 02/05/21  Elgergawy, Ardia Kraft, MD      Allergies    Codeine, Sulfonamide derivatives, and Other    Review of Systems   Review of Systems  Constitutional:  Negative for chills  and fever.  HENT:  Negative for ear pain and sore throat.   Eyes:  Negative for pain and visual disturbance.  Respiratory:  Negative for cough and shortness of breath.   Cardiovascular:  Negative for chest pain and palpitations.  Gastrointestinal:  Negative for abdominal pain and vomiting.  Genitourinary:  Negative for dysuria and hematuria.  Musculoskeletal:  Negative for arthralgias and back pain.  Skin:  Negative for color change and rash.  Neurological:  Positive for headaches. Negative for seizures and syncope.  All other systems reviewed and are negative.   Physical Exam Updated Vital Signs BP (!) 198/80 (BP Location: Left Arm)   Pulse 76   Temp 98.9 F (37.2 C) (Oral)   Resp 18   SpO2 98%  Physical Exam Vitals and nursing note reviewed.  Constitutional:      General: She is not in acute distress.    Appearance: Normal appearance. She is well-developed.  HENT:     Head: Normocephalic and atraumatic.  Eyes:     Extraocular Movements: Extraocular movements intact.     Conjunctiva/sclera: Conjunctivae normal.     Pupils: Pupils are equal, round, and reactive to light.  Cardiovascular:     Rate and Rhythm: Normal rate and regular rhythm.     Heart sounds: No murmur heard. Pulmonary:     Effort: Pulmonary effort is normal. No respiratory distress.  Breath sounds: Normal breath sounds.  Abdominal:     Palpations: Abdomen is soft.     Tenderness: There is no abdominal tenderness.  Musculoskeletal:        General: No swelling.     Cervical back: Normal range of motion and neck supple.  Skin:    General: Skin is warm and dry.     Capillary Refill: Capillary refill takes less than 2 seconds.  Neurological:     General: No focal deficit present.     Mental Status: She is alert and oriented to person, place, and time.     Cranial Nerves: No cranial nerve deficit.     Sensory: No sensory deficit.     Motor: No weakness.  Psychiatric:        Mood and Affect: Mood  normal.     ED Results / Procedures / Treatments   Labs (all labs ordered are listed, but only abnormal results are displayed) Labs Reviewed  CBC WITH DIFFERENTIAL/PLATELET  BASIC METABOLIC PANEL WITH GFR    EKG None  Radiology No results found.  Procedures Procedures    Medications Ordered in ED Medications - No data to display  ED Course/ Medical Decision Making/ A&P                                 Medical Decision Making Amount and/or Complexity of Data Reviewed Labs: ordered. Radiology: ordered.  Risk Prescription drug management.   Based on this being similar to her symptom and presentation back in August 2022 when she had central venous sinus thrombosis.  Will get CT head CT venogram.  Will check basic labs.  CT head without any acute findings.  CT venogram without any demonstration of venous sinus thrombosis.  Resolved intracranial thrombosis demonstrated in 2022.  Based on this patient should be stable for discharge home.  And follow-up with her primary care doctor.  Final Clinical Impression(s) / ED Diagnoses Final diagnoses:  Acute intractable headache, unspecified headache type  Hypertension, unspecified type    Rx / DC Orders ED Discharge Orders     None         Nicklas Barns, MD 12/09/23 1201    Nicklas Barns, MD 12/09/23 1408

## 2023-12-09 NOTE — Telephone Encounter (Signed)
 Patient with history of CVST possibly in setting of COVID from hypercoagulability.  At prior visit over 2 years ago, she was about to complete Eliquis  duration and advised to transition to aspirin 81 mg daily after that time.  She should still have been on aspirin 81 mg daily but if she feels she is having recurrent stroke like symptoms or recurrent CVST, agree with proceeding to ED for emergent evaluation. Thank you.

## 2023-12-09 NOTE — ED Notes (Signed)
 Fall risk bundle in place

## 2023-12-15 DIAGNOSIS — I10 Essential (primary) hypertension: Secondary | ICD-10-CM | POA: Diagnosis not present

## 2023-12-15 DIAGNOSIS — R519 Headache, unspecified: Secondary | ICD-10-CM | POA: Diagnosis not present

## 2023-12-15 DIAGNOSIS — Z86718 Personal history of other venous thrombosis and embolism: Secondary | ICD-10-CM | POA: Diagnosis not present

## 2023-12-15 DIAGNOSIS — E782 Mixed hyperlipidemia: Secondary | ICD-10-CM | POA: Diagnosis not present

## 2024-01-11 DIAGNOSIS — F411 Generalized anxiety disorder: Secondary | ICD-10-CM | POA: Diagnosis not present

## 2024-01-11 DIAGNOSIS — R030 Elevated blood-pressure reading, without diagnosis of hypertension: Secondary | ICD-10-CM | POA: Diagnosis not present

## 2024-01-11 DIAGNOSIS — F331 Major depressive disorder, recurrent, moderate: Secondary | ICD-10-CM | POA: Diagnosis not present

## 2024-01-11 DIAGNOSIS — Z86718 Personal history of other venous thrombosis and embolism: Secondary | ICD-10-CM | POA: Diagnosis not present

## 2024-01-11 DIAGNOSIS — G4700 Insomnia, unspecified: Secondary | ICD-10-CM | POA: Diagnosis not present

## 2024-02-29 DIAGNOSIS — I10 Essential (primary) hypertension: Secondary | ICD-10-CM | POA: Diagnosis not present

## 2024-05-09 DIAGNOSIS — E782 Mixed hyperlipidemia: Secondary | ICD-10-CM | POA: Diagnosis not present

## 2024-05-09 DIAGNOSIS — R7989 Other specified abnormal findings of blood chemistry: Secondary | ICD-10-CM | POA: Diagnosis not present

## 2024-05-15 DIAGNOSIS — R7989 Other specified abnormal findings of blood chemistry: Secondary | ICD-10-CM | POA: Diagnosis not present

## 2024-05-15 DIAGNOSIS — E782 Mixed hyperlipidemia: Secondary | ICD-10-CM | POA: Diagnosis not present

## 2024-05-15 DIAGNOSIS — Z23 Encounter for immunization: Secondary | ICD-10-CM | POA: Diagnosis not present

## 2024-05-15 DIAGNOSIS — Z79899 Other long term (current) drug therapy: Secondary | ICD-10-CM | POA: Diagnosis not present

## 2024-05-15 DIAGNOSIS — I10 Essential (primary) hypertension: Secondary | ICD-10-CM | POA: Diagnosis not present

## 2024-05-15 DIAGNOSIS — Z7185 Encounter for immunization safety counseling: Secondary | ICD-10-CM | POA: Diagnosis not present

## 2024-05-17 DIAGNOSIS — L738 Other specified follicular disorders: Secondary | ICD-10-CM | POA: Diagnosis not present

## 2024-05-17 DIAGNOSIS — D485 Neoplasm of uncertain behavior of skin: Secondary | ICD-10-CM | POA: Diagnosis not present

## 2024-05-17 DIAGNOSIS — D1801 Hemangioma of skin and subcutaneous tissue: Secondary | ICD-10-CM | POA: Diagnosis not present

## 2024-05-17 DIAGNOSIS — L814 Other melanin hyperpigmentation: Secondary | ICD-10-CM | POA: Diagnosis not present

## 2024-05-17 DIAGNOSIS — L821 Other seborrheic keratosis: Secondary | ICD-10-CM | POA: Diagnosis not present

## 2024-05-17 DIAGNOSIS — C44311 Basal cell carcinoma of skin of nose: Secondary | ICD-10-CM | POA: Diagnosis not present

## 2024-06-07 ENCOUNTER — Other Ambulatory Visit (HOSPITAL_BASED_OUTPATIENT_CLINIC_OR_DEPARTMENT_OTHER): Payer: Self-pay

## 2024-08-30 ENCOUNTER — Ambulatory Visit: Admitting: Emergency Medicine
